# Patient Record
Sex: Female | Born: 1945
Health system: Southern US, Community
[De-identification: ages and names within clinical notes are randomized; demographics above are authoritative.]

## PROBLEM LIST (undated history)

## (undated) DIAGNOSIS — F419 Anxiety disorder, unspecified: Secondary | ICD-10-CM

## (undated) DIAGNOSIS — M5432 Sciatica, left side: Secondary | ICD-10-CM

## (undated) DIAGNOSIS — M4800 Spinal stenosis, site unspecified: Secondary | ICD-10-CM

## (undated) DIAGNOSIS — M7740 Metatarsalgia, unspecified foot: Secondary | ICD-10-CM

## (undated) DIAGNOSIS — M1990 Unspecified osteoarthritis, unspecified site: Secondary | ICD-10-CM

## (undated) DIAGNOSIS — N816 Rectocele: Secondary | ICD-10-CM

## (undated) DIAGNOSIS — J45909 Unspecified asthma, uncomplicated: Secondary | ICD-10-CM

## (undated) DIAGNOSIS — C4492 Squamous cell carcinoma of skin, unspecified: Secondary | ICD-10-CM

## (undated) DIAGNOSIS — R269 Unspecified abnormalities of gait and mobility: Secondary | ICD-10-CM

## (undated) DIAGNOSIS — K219 Gastro-esophageal reflux disease without esophagitis: Secondary | ICD-10-CM

## (undated) DIAGNOSIS — H269 Unspecified cataract: Secondary | ICD-10-CM

## (undated) DIAGNOSIS — H3320 Serous retinal detachment, unspecified eye: Secondary | ICD-10-CM

## (undated) DIAGNOSIS — G2581 Restless legs syndrome: Secondary | ICD-10-CM

## (undated) DIAGNOSIS — IMO0002 Reserved for concepts with insufficient information to code with codable children: Secondary | ICD-10-CM

## (undated) DIAGNOSIS — N952 Postmenopausal atrophic vaginitis: Secondary | ICD-10-CM

## (undated) DIAGNOSIS — M25559 Pain in unspecified hip: Secondary | ICD-10-CM

## (undated) DIAGNOSIS — E079 Disorder of thyroid, unspecified: Secondary | ICD-10-CM

## (undated) DIAGNOSIS — M216X9 Other acquired deformities of unspecified foot: Secondary | ICD-10-CM

## (undated) DIAGNOSIS — T7840XA Allergy, unspecified, initial encounter: Secondary | ICD-10-CM

## (undated) HISTORY — DX: Spinal stenosis, site unspecified: M48.00

## (undated) HISTORY — DX: Unspecified osteoarthritis, unspecified site: M19.90

## (undated) HISTORY — DX: Anxiety disorder, unspecified: F41.9

## (undated) HISTORY — DX: Postmenopausal atrophic vaginitis: N95.2

## (undated) HISTORY — DX: Other acquired deformities of unspecified foot: M21.6X9

## (undated) HISTORY — DX: Unspecified abnormalities of gait and mobility: R26.9

## (undated) HISTORY — PX: APPENDECTOMY: SHX54

## (undated) HISTORY — DX: Squamous cell carcinoma of skin, unspecified: C44.92

## (undated) HISTORY — DX: Serous retinal detachment, unspecified eye: H33.20

## (undated) HISTORY — PX: MYOMECTOMY: SHX85

## (undated) HISTORY — DX: Metatarsalgia, unspecified foot: M77.40

## (undated) HISTORY — PX: SPINE SURGERY: SHX786

## (undated) HISTORY — DX: Restless legs syndrome: G25.81

## (undated) HISTORY — DX: Rectocele: N81.6

## (undated) HISTORY — DX: Allergy, unspecified, initial encounter: T78.40XA

## (undated) HISTORY — DX: Disorder of thyroid, unspecified: E07.9

## (undated) HISTORY — DX: Unspecified asthma, uncomplicated: J45.909

## (undated) HISTORY — DX: Pain in unspecified hip: M25.559

## (undated) HISTORY — DX: Reserved for concepts with insufficient information to code with codable children: IMO0002

## (undated) HISTORY — PX: TONSILLECTOMY: SUR1361

## (undated) HISTORY — DX: Gastro-esophageal reflux disease without esophagitis: K21.9

## (undated) HISTORY — DX: Unspecified cataract: H26.9

## (undated) HISTORY — PX: EYE SURGERY: SHX253

## (undated) HISTORY — DX: Sciatica, left side: M54.32

## (undated) LAB — HM MAMMOGRAPHY

---

## 1997-10-01 ENCOUNTER — Other Ambulatory Visit: Admission: RE | Admit: 1997-10-01 | Discharge: 1997-10-01 | Payer: Self-pay | Admitting: Obstetrics and Gynecology

## 1997-11-04 ENCOUNTER — Other Ambulatory Visit: Admission: RE | Admit: 1997-11-04 | Discharge: 1997-11-04 | Payer: Self-pay | Admitting: Obstetrics and Gynecology

## 1998-01-22 ENCOUNTER — Other Ambulatory Visit: Admission: RE | Admit: 1998-01-22 | Discharge: 1998-01-22 | Payer: Self-pay | Admitting: Obstetrics and Gynecology

## 1998-04-26 ENCOUNTER — Other Ambulatory Visit: Admission: RE | Admit: 1998-04-26 | Discharge: 1998-04-26 | Payer: Self-pay | Admitting: Obstetrics and Gynecology

## 1998-06-23 ENCOUNTER — Ambulatory Visit (HOSPITAL_COMMUNITY): Admission: RE | Admit: 1998-06-23 | Discharge: 1998-06-23 | Payer: Self-pay | Admitting: Obstetrics and Gynecology

## 1998-09-21 ENCOUNTER — Other Ambulatory Visit: Admission: RE | Admit: 1998-09-21 | Discharge: 1998-09-21 | Payer: Self-pay | Admitting: Obstetrics and Gynecology

## 1999-09-22 ENCOUNTER — Other Ambulatory Visit: Admission: RE | Admit: 1999-09-22 | Discharge: 1999-09-22 | Payer: Self-pay | Admitting: Obstetrics and Gynecology

## 2000-10-01 ENCOUNTER — Other Ambulatory Visit: Admission: RE | Admit: 2000-10-01 | Discharge: 2000-10-01 | Payer: Self-pay | Admitting: Obstetrics and Gynecology

## 2000-11-07 ENCOUNTER — Other Ambulatory Visit: Admission: RE | Admit: 2000-11-07 | Discharge: 2000-11-07 | Payer: Self-pay | Admitting: Obstetrics and Gynecology

## 2000-11-07 ENCOUNTER — Encounter (INDEPENDENT_AMBULATORY_CARE_PROVIDER_SITE_OTHER): Payer: Self-pay

## 2000-12-12 ENCOUNTER — Other Ambulatory Visit: Admission: RE | Admit: 2000-12-12 | Discharge: 2000-12-12 | Payer: Self-pay | Admitting: Obstetrics and Gynecology

## 2000-12-12 ENCOUNTER — Encounter (INDEPENDENT_AMBULATORY_CARE_PROVIDER_SITE_OTHER): Payer: Self-pay

## 2001-03-28 ENCOUNTER — Other Ambulatory Visit: Admission: RE | Admit: 2001-03-28 | Discharge: 2001-03-28 | Payer: Self-pay | Admitting: Obstetrics and Gynecology

## 2001-10-01 ENCOUNTER — Other Ambulatory Visit: Admission: RE | Admit: 2001-10-01 | Discharge: 2001-10-01 | Payer: Self-pay | Admitting: Obstetrics and Gynecology

## 2002-11-10 ENCOUNTER — Other Ambulatory Visit: Admission: RE | Admit: 2002-11-10 | Discharge: 2002-11-10 | Payer: Self-pay | Admitting: Obstetrics and Gynecology

## 2003-11-12 ENCOUNTER — Other Ambulatory Visit: Admission: RE | Admit: 2003-11-12 | Discharge: 2003-11-12 | Payer: Self-pay | Admitting: Obstetrics and Gynecology

## 2004-03-09 ENCOUNTER — Encounter (INDEPENDENT_AMBULATORY_CARE_PROVIDER_SITE_OTHER): Payer: Self-pay | Admitting: Specialist

## 2004-03-09 ENCOUNTER — Ambulatory Visit (HOSPITAL_COMMUNITY): Admission: RE | Admit: 2004-03-09 | Discharge: 2004-03-09 | Payer: Self-pay | Admitting: Gastroenterology

## 2004-11-18 ENCOUNTER — Other Ambulatory Visit: Admission: RE | Admit: 2004-11-18 | Discharge: 2004-11-18 | Payer: Self-pay | Admitting: Addiction Medicine

## 2005-03-15 ENCOUNTER — Ambulatory Visit: Payer: Self-pay | Admitting: Internal Medicine

## 2006-01-12 ENCOUNTER — Ambulatory Visit: Payer: Self-pay

## 2006-01-23 ENCOUNTER — Ambulatory Visit (HOSPITAL_COMMUNITY): Admission: RE | Admit: 2006-01-23 | Discharge: 2006-01-24 | Payer: Self-pay | Admitting: Ophthalmology

## 2009-03-11 ENCOUNTER — Ambulatory Visit: Payer: Self-pay | Admitting: Sports Medicine

## 2009-03-11 DIAGNOSIS — R269 Unspecified abnormalities of gait and mobility: Secondary | ICD-10-CM

## 2009-03-11 DIAGNOSIS — M216X9 Other acquired deformities of unspecified foot: Secondary | ICD-10-CM

## 2009-03-11 DIAGNOSIS — M7740 Metatarsalgia, unspecified foot: Secondary | ICD-10-CM

## 2009-03-11 DIAGNOSIS — M7750 Other enthesopathy of unspecified foot: Secondary | ICD-10-CM | POA: Insufficient documentation

## 2009-03-11 DIAGNOSIS — Q667 Congenital pes cavus, unspecified foot: Secondary | ICD-10-CM | POA: Insufficient documentation

## 2009-03-11 HISTORY — DX: Other acquired deformities of unspecified foot: M21.6X9

## 2009-03-11 HISTORY — DX: Metatarsalgia, unspecified foot: M77.40

## 2009-03-11 HISTORY — DX: Unspecified abnormalities of gait and mobility: R26.9

## 2009-03-24 ENCOUNTER — Encounter: Payer: Self-pay | Admitting: Sports Medicine

## 2009-04-26 HISTORY — PX: ESOPHAGOGASTRODUODENOSCOPY: SHX1529

## 2009-04-26 HISTORY — PX: COLONOSCOPY: SHX174

## 2009-05-13 ENCOUNTER — Ambulatory Visit (HOSPITAL_COMMUNITY): Admission: RE | Admit: 2009-05-13 | Discharge: 2009-05-13 | Payer: Self-pay | Admitting: Gastroenterology

## 2009-07-16 ENCOUNTER — Encounter: Admission: RE | Admit: 2009-07-16 | Discharge: 2009-07-16 | Payer: Self-pay | Admitting: Emergency Medicine

## 2010-01-17 ENCOUNTER — Ambulatory Visit: Payer: Self-pay | Admitting: Sports Medicine

## 2010-01-17 DIAGNOSIS — M5432 Sciatica, left side: Secondary | ICD-10-CM

## 2010-01-17 HISTORY — DX: Sciatica, left side: M54.32

## 2010-01-18 ENCOUNTER — Telehealth: Payer: Self-pay | Admitting: Sports Medicine

## 2010-02-21 ENCOUNTER — Ambulatory Visit: Payer: Self-pay | Admitting: Sports Medicine

## 2010-07-26 NOTE — Assessment & Plan Note (Signed)
Summary: HIP PAIN/MJD   Vital Signs:  Patient profile:   65 year old female Height:      65 inches BP sitting:   100 / 70  (left arm) Cuff size:   regular  Vitals Entered By: Tessie Fass CMA (January 17, 2010 11:06 AM) CC: left hip pain   CC:  left hip pain.  History of Present Illness: 24F with sciatica.  10 yr hx of dealing with this pain, went to orthopaedist, has collapse of L5/S1 disc, did some PT, no improvement. Went to [employer], got some exercises, improved a little. Went to [facility] and had acupuncture, minimal improvement. Piriformis stretches at bedtime improve her pain transiently. 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 Pain mostly at L piriformis, radiates down back of thigh. No stool/urinary incontinence. Taking motrin 800 three times a day an tyleno qHS which only helps a little. Pain worse with hip ER exercises.  Wants new orthotics for another pair of shoes.  Current Medications (verified): 1)  Atrovent 0.03 % Soln (Ipratropium Bromide) .... 2 Sprays/nostril Three Times A Day 2)  Claritin 10 Mg Tabs (Loratadine) .Marland Kitchen.. 1 Tab By Mouth Daily 3)  Lunesta 2 Mg Tabs (Eszopiclone) .... As Needed Insomnia 4)  Alprazolam 0.5 Mg Tabs (Alprazolam) .... As Needed Insomnia/anxiety 5)  Sertraline Hcl 25 Mg Tabs (Sertraline Hcl) .Marland Kitchen.. 1 Tab By Mouth Daily 6)  Fosamax 70 Mg Tabs (Alendronate Sodium) .Marland Kitchen.. 1 Tab Weekly 7)  Aspir-Low 81 Mg Tbec (Aspirin) .Marland Kitchen.. 1 Tab By Mouth Daily 8)  Calcium 500/d 500-400 Mg-Unit Chew (Calcium-Vitamin D) .... 2 Tabs Daily 9)  Fish Oil 1000 Mg Caps (Omega-3 Fatty Acids) .... Two Times A Day 10)  Amitriptyline Hcl 50 Mg Tabs (Amitriptyline Hcl) .... One Half Tab By Mouth Qhs X 1 Week, Then One Tab By Mouth Qhs.  May Increase To Two Tabs By Mouth Qhs After A Week If No Improvement.  Allergies (verified): 1)  ! Sulfa  Review of Systems       SEe HPI  Physical Exam  General:  Well-developed,well-nourished,in no acute  distress; alert,appropriate and cooperative throughout examination Msk:  Hip:L ROM IR: 50 Deg, ER: 50 Deg, Flexion: 120 Deg, Extension: 100 Deg, Abduction: 45 Deg, Adduction: 45 Deg Strength IR: 5/5, ER: 5/5, Flexion: 5/5, Extension: 5/5, Abduction: 4/5, Adduction: 5/5 Pelvic alignment unremarkable to inspection and palpation. Standing hip rotation and gait without trendelenburg / unsteadiness. Greater trochanter without tenderness to palpation. Significant tenderness over piriformis. No SI joint tenderness and normal minimal SI movement.   Hip:R ROM IR: 50 Deg, ER: 50 Deg, Flexion: 120 Deg, Extension: 100 Deg, Abduction: 45 Deg, Adduction: 45 Deg Strength IR: 5/5, ER: 5/5, Flexion: 5/5, Extension: 5/5, Abduction: 5/5, Adduction: 5/5 Pelvic alignment unremarkable to inspection and palpation. Standing hip rotation and gait without trendelenburg / unsteadiness. Greater trochanter without tenderness to palpation. No tenderness over piriformis. No SI joint tenderness and normal minimal SI movement.  Additional Exam:  Gait examined and unremarkable.   Impression & Recommendations:  Problem # 1:  SCIATICA, LEFT (ICD-724.3) Assessment New Classic chronic sciatica at the piriformis exit. Recommend strengthen L hip abductors/rehab exercises. Amitryptiline No steroid for now. RTC 6 wks to fu.  Her updated medication list for this problem includes:    Aspir-low 81 Mg Tbec (Aspirin) .Marland Kitchen... 1 tab by mouth daily  Problem # 2:  CAVUS DEFORMITY OF FOOT, ACQUIRED (ICD-736.73) Assessment: Comment Only Will make fu appt for new orthotics.  Complete  Medication List: 1)  Atrovent 0.03 % Soln (Ipratropium bromide) .... 2 sprays/nostril three times a day 2)  Claritin 10 Mg Tabs (Loratadine) .Marland Kitchen.. 1 tab by mouth daily 3)  Lunesta 2 Mg Tabs (Eszopiclone) .... As needed insomnia 4)  Alprazolam 0.5 Mg Tabs (Alprazolam) .... As needed insomnia/anxiety 5)  Sertraline Hcl 25 Mg Tabs (Sertraline hcl)  .Marland Kitchen.. 1 tab by mouth daily 6)  Fosamax 70 Mg Tabs (Alendronate sodium) .Marland Kitchen.. 1 tab weekly 7)  Aspir-low 81 Mg Tbec (Aspirin) .Marland Kitchen.. 1 tab by mouth daily 8)  Calcium 500/d 500-400 Mg-unit Chew (Calcium-vitamin d) .... 2 tabs daily 9)  Fish Oil 1000 Mg Caps (Omega-3 fatty acids) .... Two times a day 10)  Amitriptyline Hcl 50 Mg Tabs (Amitriptyline hcl) .... One half tab by mouth qhs x 1 week, then one tab by mouth qhs.  may increase to two tabs by mouth qhs after a week if no improvement.  Patient Instructions: 1)  Great to meet you. 2)  Hip exercises. 3)  New medicine amytryptiline. 4)  Come back to see [place] in 6 weeks to make sure things are getting better.   Prescriptions: AMITRIPTYLINE HCL 50 MG TABS (AMITRIPTYLINE HCL) One half tab by mouth qHS x 1 week, then one tab by mouth qHS.  May increase to two tabs by mouth qHS after a week if no improvement.  #60 x 0   Entered by:   Rodney Langton MD   Authorized by:   Enid Baas MD   Signed by:   Rodney Langton MD on 01/17/2010   Method used:   Electronically to        [facility]  [address] [phone]* (retail)       [address]       [place], [place]  [address]       Ph: [phone] or [phone]       Fax: [phone]   RxID:   [id number]

## 2010-07-26 NOTE — Progress Notes (Signed)
  Phone Note Outgoing Call   Call placed by: Rodney Langton MD,  January 18, 2010 10:37 AM Summary of Call: Called pt re: max of 50mg  on amitryptiline.  She took the 1/2 tab, states she slept all night, no pain.  Will cont to increase as directed to a max of 50.  Also with some pain with abductor strengthening exercises.  She can get to 10 reps before pain.  Advised she do 10 and then stop before gets to pain, cont 3 sets, increase reps as tolerated. Initial call taken by: Rodney Langton MD,  January 18, 2010 10:45 AM    New/Updated Medications: AMITRIPTYLINE HCL 50 MG TABS (AMITRIPTYLINE HCL) One half tab by mouth qHS x 1 week, then one tab by mouth qHS.

## 2010-07-26 NOTE — Assessment & Plan Note (Signed)
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  Sciatica about 80% better did have rinary sxs on amitriptyline recent UTI now off the amitriptyline x 10 days keeping up exercises we RX and she feels that she can control sxs easing back into yoga  Allergies: 1)  ! Sulfa  Physical Exam  General:  Well-developed,well-nourished,in no acute distress; alert,appropriate and cooperative throughout examination Msk:  good ROM w hip able to do SLR and lat leg raise w no sciatic sxs good rotatory motin of hip and SIJ  feet are cavus shape has significant forefoot callus but less than beofre RT end to has pain at base and with flex and ext   Impression & Recommendations:  Problem # 1:  ABNORMALITY OF GAIT (ICD-781.2)  she was supinated and w forefoot was favoring this 2/2 metatarsalgai  Patient was fitted for a standard, cushioned, semi-rigid orthotic.  The orthotic was heated and the patient stood on the orthotic blank positioned on the orthotic stand. The patient was positioned in subtalar neutral position and 10 degrees of ankle dorsiflexion in a weight bearing stance. After completion of molding a stable based was applied to the orthotic blank.   The blank was ground to a stable position for weight bearing. size 10 red cambray foot tek base blue med EVA posting  RT forefoot cushion additional orthotic padding  RT MT pad  tiem 35 mins  gait afterwards was comfortable now in neutral position no limp  Orders: [employer], each unit (L3002)  Problem  # 2:  METATARSALGIA (ICD-726.70)  keep using MT pad on RT  Orders: [employer], each unit (L3002)  Problem # 3:  CAVUS DEFORMITY OF FOOT, ACQUIRED (ICD-736.73)  This is likely cause of stress over long term use shoes w good arch when not in orthotics  Orders: [employer], each unit (L3002)  Problem # 4:  SCIATICA, LEFT (ICD-724.3) improved will RX primarily w exercises and stop amitriptyline for now  reck as needed  copy to DR richter at [facility]  Complete Medication List: 1)  Atrovent 0.03 % Soln (Ipratropium bromide) .... 2 sprays/nostril three times a day 2)  Claritin 10 Mg Tabs (Loratadine) .Marland Kitchen.. 1 tab by mouth daily 3)  Lunesta 2 Mg Tabs (Eszopiclone) .... As needed insomnia 4)  Alprazolam 0.5 Mg Tabs (Alprazolam) .... As needed insomnia/anxiety 5)  Fosamax 70 Mg Tabs (Alendronate sodium) .Marland Kitchen.. 1 tab weekly 6)  Calcium 500/d 500-400 Mg-unit Chew (Calcium-vitamin d) .... 2 tabs daily 7)  Fish Oil 1000 Mg Caps (Omega-3 fatty acids) .... Two times a day 8)  Amitriptyline Hcl 50 Mg Tabs (Amitriptyline hcl) .... One half tab by mouth qhs x 1 week, then one tab by mouth qhs.

## 2010-11-11 NOTE — Op Note (Signed)
Kristin Tapia, Kristin Tapia               ACCOUNT NO.:  [id number]   MEDICAL RECORD NO.:  [id number]          PATIENT TYPE:  OIB   LOCATION:  5705                         FACILITY:  [facility]   PHYSICIAN:  Alford Highland. Rankin, M.D.   DATE OF BIRTH:  January 23, 1946   DATE OF PROCEDURE:  01/23/2006  DATE OF DISCHARGE:  01/24/2006                                 OPERATIVE REPORT   PREOPERATIVE DIAGNOSIS:  Rhegmatogenous detachment right eye, macula on.   POSTOPERATIVE DIAGNOSIS:  Rhegmatogenous detachment right eye, macula on.   PROCEDURE:  1. Scleral buckle using 287 solid silicone explant segmental superiorly      with retinal cryopexy OD.  2. External drainage subretinal fluid OD.   SURGEON:  Alford Highland. Rankin, M.D.   ANESTHESIA:  General endotracheal anesthesia.   INDICATIONS:  The patient is a 65 year old woman who has visual field loss  inferiorly on the basis of rhegmatogenous detachment with low lying  posterior vitreous attachment and not enough subretinal fluid to safely  repair the retinal detachment in the office with retinal cryopexy and  pneumatic retinopexy.  The patient was explained the need for undergoing  scleral buckling procedure.  She understands the risks of anesthesia,  __________ including but not limited to hemorrhage, infection, scarring,  need for another surgery, no change in vision, loss of vision, or  ________need for another surger__.  Appropriate signed consent was obtained.   PROCEDURE IN DETAIL:  The patient was taken to the operating room.  Appropriate monitors were placed followed by mild sedation.  At this time  general endotracheal anesthesia was induced without difficulty.  The right  periocular region was sterilely prepped and draped in the usual opthalmic  fashion.  Lid speculum was applied.  Conjunctival peritomy was then  fashioned from the 7 o'clock position superiorly around to the 3 o'clock  position.  The medial, superior and lateral rectus muscles were  isolated on  2-0 silk ties.  Ophthalmoscopy was then used to direct the delivery of  retinal cryopexy.  Retinal holes were found at the 1 o'clock, 12 o'clock and  the 10:30 positions.  Retinal cryopexy was applied to these areas.   At this time buckle was secured from approximately the 10 o'clock position  around to the 2 o'clock position.  Excellent temporary scleral indentation  was obtained.  Mersilene 5-0 mattress sutures were used to secure the buckle  in these areas.  At this time external draining of subretinal fluid was then  carried out in the area of the taller subretinal fluid accumulation which  was at the 1 o'clock position.  Also, retinal fluid was removed in this  fashion.  Intraocular pressure was maintained.  The buckle was then  tightened to the appropriate tension with 5-0 Mersilene mattress sutures.  At this time the buckle was tied to the appropriate tension.   Conjunctiva and tenons were then brought forward and closed in layers with 7-  0 Vicryl suture.  Subconjunctival injection of antibiotics were then  applied.  Sterile patch and Fox shield were applied.  The patient was  awakened from  anesthesia without difficulty and taken to the recovery room  in good stable condition.      Alford Highland Rankin, M.D.  Electronically Signed     GAR/MEDQ  D:  01/23/2006  T:  01/24/2006  Job:  [id number]   cc:   Molly Maduro L. Dione Booze, M.D.

## 2010-11-11 NOTE — Op Note (Signed)
NAME:  SAN, RUA                         ACCOUNT NO.:  [id number]   MEDICAL RECORD NO.:  [id number]                   PATIENT TYPE:  AMB   LOCATION:  ENDO                                 FACILITY:  [facility]   PHYSICIAN:  Petra Kuba, M.D.                 DATE OF BIRTH:  1946-06-24   DATE OF PROCEDURE:  03/09/2004  DATE OF DISCHARGE:                                 OPERATIVE REPORT   PROCEDURE:  Colonoscopy.   INDICATIONS FOR PROCEDURE:  Change in bowel habits, due for colonic  screening, father with colon polyps.  Consent was signed after risks,  benefits, and options were thoroughly discussed in the office.   MEDICATIONS USED:  Fentanyl 80 mcg, Versed 10 mg.   PROCEDURE:  Rectal inspection was pertinent for external hemorrhoids.  Digital exam was negative.  The video pediatric adjustable colonoscope was  inserted and with lots of difficulty due to a tortuous long looping colon,  we were able to advance to the cecum.  This did require rolling her on her  back  and some abdominal pressure.  No obvious abnormality was seen on  insertion.  The cecum was identified by the appendiceal orifice and the  ileocecal valve.  The scope was inserted a short ways into the terminal  ileum which was normal.  Photodocumentation was obtained.  The scope was  slowly withdrawn.  The prep was fairly adequate, with lots of washing and  suctioning, adequate visualization was obtained.  On slow withdrawal through  the colon, when we fell back around the tortuous loop, we did try to  readvance around the curves to decrease the chance of missing things.  She  did have a tiny ascending colon which was cold biopsied.  Otherwise, on slow  withdrawal, no abnormalities were seen.  She also had a tortuous sigmoid, as  well.  Once back in the rectum, anorectal pull through and retroflexion  confirmed some small hemorrhoids.  The scope was straightened and advanced a  short ways up the left side of the colon,  air was suctioned, the scope was  removed.  The patient tolerated the procedure well.  There was no obvious  immediate complications.   ENDOSCOPIC DIAGNOSIS:  1.  Internal and external small hemorrhoids.  2.  Tortuous long looping colon.  3.  Ascending tiny polyp cold biopsied.  4.  Otherwise, within normal limits to the terminal ileum.   PLAN:  Follow up with me p.r.n. or in 2-3 months to recheck bowel habits,  make sure no other workup plans are needed, consider a virtual colonoscopy  when next screening is needed based on tortuosity and the looping, we do not  have to decide that for probably five years.  Will be happy to see back  sooner p.r.n.  Petra Kuba, M.D.    MEM/MEDQ  D:  03/09/2004  T:  03/09/2004  Job:  [id number]

## 2011-10-01 ENCOUNTER — Other Ambulatory Visit: Payer: Self-pay | Admitting: Physician Assistant

## 2011-10-30 ENCOUNTER — Other Ambulatory Visit: Payer: Self-pay

## 2011-10-30 MED ORDER — ALENDRONATE SODIUM 70 MG PO TABS: 70.0000 mg | ORAL_TABLET | ORAL | Status: DC

## 2012-03-19 ENCOUNTER — Telehealth: Payer: Self-pay

## 2012-03-19 NOTE — Telephone Encounter (Signed)
Per Dr. Katrinka Blazing, on the bone density scan results, call pt and let her know that the scan is unchanged since 2011 and there is no worsening osteopenia. She said to continue current plan of treatment. LMOM to CB for these results.

## 2012-03-20 NOTE — Telephone Encounter (Signed)
Spoke with pt about this. Pt understood

## 2012-03-26 ENCOUNTER — Encounter: Payer: Self-pay | Admitting: Family Medicine

## 2012-03-26 ENCOUNTER — Ambulatory Visit (INDEPENDENT_AMBULATORY_CARE_PROVIDER_SITE_OTHER): Payer: Medicare Other | Admitting: Family Medicine

## 2012-03-26 VITALS — BP 102/64 | HR 65 | Temp 98.0°F | Resp 16 | Ht 64.25 in | Wt 133.2 lb

## 2012-03-26 DIAGNOSIS — Z23 Encounter for immunization: Secondary | ICD-10-CM

## 2012-03-26 DIAGNOSIS — Z01419 Encounter for gynecological examination (general) (routine) without abnormal findings: Secondary | ICD-10-CM

## 2012-03-26 DIAGNOSIS — Z13220 Encounter for screening for lipoid disorders: Secondary | ICD-10-CM

## 2012-03-26 DIAGNOSIS — Z0000 Encounter for general adult medical examination without abnormal findings: Secondary | ICD-10-CM

## 2012-03-26 DIAGNOSIS — Z131 Encounter for screening for diabetes mellitus: Secondary | ICD-10-CM

## 2012-03-26 LAB — GLUCOSE, POCT (MANUAL RESULT ENTRY): POC Glucose: 88 mg/dl (ref 70–99)

## 2012-03-26 NOTE — Progress Notes (Signed)
[address]   [place], [place]  [address]   [phone]  Subjective:    Patient ID: Kristin Tapia, female    DOB: September 07, 1945, 66 y.o.   MRN: [id number]  HPIThis 66 y.o. female presents for evaluation for Initial Annual Wellness Examination.  Last physical 03/13/11.  Pap smear 2011.  Mammogram 04/10/11 Kristin Tapia.  Pneumovax 02/2011.  Tetanus 06/2008.  Zostavax 2008 at Kristin Tapia.  Influenza vaccine 2012. Colonoscopy 04/2009.  Bone density scan 02/2012.  Eye exam 05/2011 Kristin Tapia; +glasses/ no glaucoma; +s/p cataracts B. Dental exam every six months.     PMH: detached retina 2008 R (Kristin Tapia), allergic rhinitis (shots) Kristin Tapia.  Asthma Childhood, GERD, RLS L leg (Kristin Tapia), Anxiety Sciatica L leg, Syncope Psurg:  1.  Appendectomy  2.  Gynecological myomectomy.  Kristin Tapia.  3.  Tonsillectomy  4. EGD  5.  Colonoscopy Specialist:  Kristin Tapia/Kristin Tapia (1/year), Kristin Tapia for orthotics, joint issues.  Kristin Tapia/Dermatology, Kristin Tapia or Kristin Tapia/Ophthalmology, Kristin Tapia/GI, Kristin Tapia/Neurology for RLS, insomnia, [place]/physiotherapist. 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  Water aerobics three days per week.  No tobacco/alcohol/drugs.  Joined Kristin Tapia.   Living Will:  FULL CODE but no prolonged measures.  Kristin Tapia has a copy of living will.   Family: Kristin Tapia- died age 65 due to unknown cause; Alzheimer's disease, HTN, OA, Breast Cancer age 17.  Father-died age 1, COPD, head injury while riding bike; hit by car, [place]?.  Siblings 3:  Kristin Tapia.    Review of Systems  Constitutional: Negative for fever, chills, diaphoresis, activity change, appetite change, fatigue and unexpected weight change.  HENT: Negative for hearing loss, ear pain, nosebleeds, congestion, sore throat, facial swelling, rhinorrhea, sneezing, drooling,  mouth sores, trouble swallowing, neck pain, neck stiffness, dental problem, voice change, postnasal drip, sinus pressure, tinnitus and ear discharge.   Eyes: Negative for photophobia, pain, discharge, redness, itching and visual disturbance.  Respiratory: Negative for apnea, cough, choking, chest tightness, shortness of breath, wheezing and stridor.   Cardiovascular: Negative for chest pain, palpitations and leg swelling.  Gastrointestinal: Negative for nausea, vomiting, abdominal pain, diarrhea, constipation, blood in stool, abdominal distention, anal bleeding and rectal pain.  Genitourinary: Negative for dysuria, urgency, frequency, hematuria, flank pain, decreased urine volume, vaginal bleeding, vaginal discharge, enuresis, difficulty urinating, genital sores, vaginal pain, menstrual problem, pelvic pain and dyspareunia.  Musculoskeletal: Negative for myalgias, back pain, joint swelling, arthralgias and gait problem.  Skin: Negative for color change, pallor, rash and wound.  Neurological: Negative for dizziness, tremors, seizures, syncope, facial asymmetry, speech difficulty, weakness, light-headedness, numbness and headaches.  Hematological: Negative for adenopathy. Does not bruise/bleed easily.  Psychiatric/Behavioral: Negative for suicidal ideas, hallucinations, behavioral problems, confusion, sleep disturbance, self-injury, dysphoric mood, decreased concentration and agitation. The patient is not nervous/anxious and is not hyperactive.        Objective:   Physical Exam  Nursing note and vitals reviewed. Constitutional: She is oriented to person, place, and time. She appears well-developed and well-nourished. No distress.  HENT:  Head: Normocephalic and atraumatic.  Right Ear: External ear normal.  Left Ear: External ear normal.  Nose: Nose normal.  Mouth/Throat: Oropharynx is clear and moist.  Eyes: Conjunctivae normal and EOM are normal. Pupils are equal, round, and reactive to light.   Neck: Normal range of motion. Neck supple. No JVD present. No thyromegaly present.  Cardiovascular: Normal rate, regular rhythm, normal heart sounds and intact distal pulses.  Exam reveals no gallop and no friction rub.   No murmur heard. Pulmonary/Chest: Effort normal and breath sounds normal. She has no wheezes. She has no rales.  Abdominal: Soft. Bowel sounds are normal. She exhibits no distension and no mass. There is no tenderness. There is no rebound and no guarding. Hernia confirmed negative in the right inguinal area and confirmed negative in the left inguinal area.  Genitourinary: Vagina normal and uterus normal. No breast swelling, tenderness, discharge or bleeding. There is no rash, tenderness or lesion on the right labia. There is no rash, tenderness or lesion on the left labia. Cervix exhibits no motion tenderness, no discharge and no friability. Right adnexum displays no mass, no tenderness and no fullness. Left adnexum displays no mass, no tenderness and no fullness. No vaginal discharge found.  Musculoskeletal:       Right shoulder: Normal.       Left shoulder: Normal.       Cervical back: Normal.  Lymphadenopathy:    She has no cervical adenopathy.       Right: No inguinal adenopathy present.       Left: No inguinal adenopathy present.  Neurological: She is alert and oriented to person, place, and time. She has normal reflexes. No cranial nerve deficit. She exhibits normal muscle tone. Coordination normal.  Skin: Skin is warm and dry. No rash noted. She is not diaphoretic. No erythema. No pallor.  Psychiatric: She has a normal mood and affect. Her behavior is normal. Judgment and thought content normal.   INFLUENZA VACCINE ADMINISTERED.    Assessment & Plan:   1. Screening for lipoid disorders  Lipid panel  2. Screening for diabetes mellitus  POCT glucose (manual entry)  3. Routine general medical examination at a health care facility  EKG 12-Lead, Flu vaccine greater than  or equal to 3yo preservative free IM

## 2012-03-27 ENCOUNTER — Ambulatory Visit (INDEPENDENT_AMBULATORY_CARE_PROVIDER_SITE_OTHER): Payer: Medicare Other | Admitting: Sports Medicine

## 2012-03-27 ENCOUNTER — Encounter: Payer: Self-pay | Admitting: Sports Medicine

## 2012-03-27 VITALS — BP 120/60 | Ht 64.0 in | Wt 132.0 lb

## 2012-03-27 DIAGNOSIS — M216X9 Other acquired deformities of unspecified foot: Secondary | ICD-10-CM

## 2012-03-27 DIAGNOSIS — R269 Unspecified abnormalities of gait and mobility: Secondary | ICD-10-CM

## 2012-03-27 LAB — LIPID PANEL
Cholesterol: 207 mg/dL — ABNORMAL HIGH (ref 0–200)
HDL: 71 mg/dL (ref >39)
LDL Cholesterol: 123 mg/dL — ABNORMAL HIGH (ref 0–99)
Triglycerides: 64 mg/dL (ref <150)

## 2012-03-27 NOTE — Assessment & Plan Note (Signed)
Patient orthotics made and did have adjustments made to her older orthotics. Patient is can wear these daily and understands that it may take 2 weeks for her to become acclimated to the new orthotics. Patient can bring them back for any other adjustments are necessary. Patient did have improvement in her gait after placement of orthotics in her shoes. Patient will followup as necessary.

## 2012-03-27 NOTE — Progress Notes (Signed)
History of Present Illness:  Here for new orthotics. This has significantly helped her foot pain. Patient though has noticed over the course of last couple months she's been having more left foot pain mostly on the lateral aspect. Patient notices that after a long day. Patient denies that her sciatica has come back at this time and overall is still doing much better than when she was without her orthotics. Denies any numbness in the feet denies any swelling. Patient denies any new injuries.  Review of systems: Patient is 14 system review done and remarkable as related to the orthopedic problems  Physical Exam  General: Well-developed,well-nourished,in no acute distress; alert,appropriate and cooperative throughout examination  Foot Exam: feet are cavus shape  has significant forefoot callus on plantar aspect of both feet over the third and fourth toes but less than before  Patient does have a collection of the transverse arch bilaterally and does have some mild supination and varus deformity of the forefoot of the left foot. Patient on gait analysis does have some supination mostly of the left ankle. Otherwise patient has a fairly neutral walking position.  Procedure: Patient was fitted for a standard, cushioned, semi-rigid orthotic. The orthotic was heated and the patient stood on the orthotic blank positioned on the orthotic stand. The patient was positioned in subtalar neutral position and 10 degrees of ankle dorsiflexion in a weight bearing stance.  After completion of molding a stable based was applied to the orthotic blank.  The blank was ground to a stable position for weight bearing.  size 10 red cambray foot tek  base blue med EVA  posting RT forefoot cushion and lateral post on the left foot additional orthotic padding bilateral MT pad size small time 35 mins  gait afterwards was comfortable  now in neutral position  no limp

## 2012-04-04 ENCOUNTER — Other Ambulatory Visit: Payer: Self-pay | Admitting: Family Medicine

## 2012-04-04 ENCOUNTER — Encounter: Payer: Self-pay | Admitting: Radiology

## 2012-04-04 ENCOUNTER — Other Ambulatory Visit: Payer: Self-pay | Admitting: Physician Assistant

## 2012-04-04 MED ORDER — ESTRADIOL 0.25 MG/0.25GM TD GEL: 1.0000 mL | TRANSDERMAL | Status: DC

## 2012-04-05 ENCOUNTER — Telehealth: Payer: Self-pay | Admitting: Physician Assistant

## 2012-04-05 NOTE — Telephone Encounter (Signed)
Yes, the [facility] did [facility] yesterday, they called to verify the quantity, it comes in packages and 4 packages is a month supply. They will give her the month supply. With one refill. Patient advised [facility].

## 2012-04-05 NOTE — Telephone Encounter (Signed)
The patient was getting this medication from [facility], and states that they haven't heard back from their refill request.  It looks like [facility].  Please call and verify that [facility] (the patient is happy to get it there, but was under the impression that it had to be compounded).

## 2012-04-06 ENCOUNTER — Telehealth: Payer: Self-pay

## 2012-04-06 NOTE — Telephone Encounter (Signed)
Pt called about prescription Estradiol. Pt states prescription given is different than prior med she received from [facility]. Please call pt to discuss. Best # [phone]

## 2012-04-06 NOTE — Telephone Encounter (Signed)
Spoke with patient and the Estradiol that was sent to pharmacy was not the right one. She was on Estradiol vaginal cream use 2 x week. The one that was sent in to pharmacy was Estradiol Gel 1 x wk. She said she will use this for now an call back in a couple weeks so we can send in correct RX but [facility] not [facility].

## 2012-04-10 ENCOUNTER — Telehealth: Payer: Self-pay | Admitting: Radiology

## 2012-04-10 NOTE — Telephone Encounter (Signed)
[facility] called back they can not find her information.

## 2012-04-10 NOTE — Telephone Encounter (Signed)
Left mssg at [facility] to see if they can compound the rx for her, they will call me back.

## 2012-04-11 MED ORDER — ESTROGENS, CONJUGATED 0.625 MG/GM VA CREA: TOPICAL_CREAM | Freq: Every day | VAGINAL | Status: DC

## 2012-04-11 NOTE — Telephone Encounter (Signed)
Medication called to [facility] for her.

## 2012-04-16 LAB — HM MAMMOGRAPHY: HM Mammogram: NORMAL

## 2012-04-18 ENCOUNTER — Encounter: Payer: Self-pay | Admitting: Radiology

## 2012-04-18 ENCOUNTER — Telehealth: Payer: Self-pay | Admitting: Radiology

## 2012-04-18 NOTE — Telephone Encounter (Signed)
Yes, please schedule her next mammogram in one year.  KMS

## 2012-04-18 NOTE — Telephone Encounter (Signed)
Called patient, to advise mammogram I abstracted this, however I am not sure when she needs her next mammogram, please advise. ? 1 year, if I do not put due date in computer, it will not show as due. Thanks Amy

## 2012-04-19 NOTE — Telephone Encounter (Signed)
Thanks, patient advised.  

## 2012-05-31 DIAGNOSIS — Z0000 Encounter for general adult medical examination without abnormal findings: Secondary | ICD-10-CM | POA: Insufficient documentation

## 2012-05-31 DIAGNOSIS — Z13220 Encounter for screening for lipoid disorders: Secondary | ICD-10-CM | POA: Insufficient documentation

## 2012-05-31 DIAGNOSIS — Z23 Encounter for immunization: Secondary | ICD-10-CM | POA: Insufficient documentation

## 2012-05-31 DIAGNOSIS — Z131 Encounter for screening for diabetes mellitus: Secondary | ICD-10-CM | POA: Insufficient documentation

## 2012-05-31 NOTE — Assessment & Plan Note (Addendum)
Anticipatory guidance --- exercise, weight maintenance.  Pap UTD. Mammogram UTD at this time; will refer for repeat.  Bone density UTD.  Immunizations UTD; s/p flu vaccine in office.  Obtain screening labs.  No evidence of depression.  Independent with ADLs.  No evidence of depression.  Low fall risk.  FULL CODE.

## 2012-05-31 NOTE — Assessment & Plan Note (Signed)
Per current Medicare guidelines, obtain lipid panel screening every five years.

## 2012-05-31 NOTE — Assessment & Plan Note (Signed)
Per current Medicare guidelines, obtain yearly glucose.

## 2012-06-01 NOTE — Progress Notes (Signed)
Reviewed and agree.

## 2012-09-09 ENCOUNTER — Other Ambulatory Visit (INDEPENDENT_AMBULATORY_CARE_PROVIDER_SITE_OTHER): Payer: 59 | Admitting: Radiology

## 2012-09-09 ENCOUNTER — Other Ambulatory Visit: Payer: Self-pay | Admitting: Emergency Medicine

## 2012-09-09 DIAGNOSIS — Z23 Encounter for immunization: Secondary | ICD-10-CM

## 2012-09-27 ENCOUNTER — Encounter: Payer: Self-pay | Admitting: Family Medicine

## 2012-10-14 ENCOUNTER — Ambulatory Visit (INDEPENDENT_AMBULATORY_CARE_PROVIDER_SITE_OTHER): Payer: 59 | Admitting: Physician Assistant

## 2012-10-14 DIAGNOSIS — Z23 Encounter for immunization: Secondary | ICD-10-CM

## 2012-10-14 MED ORDER — HEPATITIS B VAC RECOMBINANT 5 MCG/0.5ML IJ SUSP: 0.5000 mL | Freq: Once | INTRAMUSCULAR | Status: DC

## 2012-10-14 NOTE — Progress Notes (Signed)
   Patient ID: [name] MRN: [id number], DOB: [birth year], 67 y.o. Date of Encounter: 10/14/2012, 4:27 PM  Primary Physician: Nilda Simmer, MD  Chief Complaint: Here for hepatitis B vaccination  66 y.o. year old female here for hepatitis B vaccination. This is the 2nd vaccine in the series. Last injection was 09/09/12. Ok to give hepatitis B vaccination. This was a nursing only encounter. No provider/patient encounter occurred today.   SignedEula Listen, PA-C 10/14/2012 4:27 PM

## 2013-01-17 ENCOUNTER — Ambulatory Visit (INDEPENDENT_AMBULATORY_CARE_PROVIDER_SITE_OTHER): Payer: Medicare Other | Admitting: Family Medicine

## 2013-01-17 VITALS — BP 100/60 | Ht 64.0 in | Wt 132.0 lb

## 2013-01-17 DIAGNOSIS — M25552 Pain in left hip: Secondary | ICD-10-CM | POA: Insufficient documentation

## 2013-01-17 DIAGNOSIS — M7750 Other enthesopathy of unspecified foot: Secondary | ICD-10-CM

## 2013-01-17 DIAGNOSIS — M25559 Pain in unspecified hip: Secondary | ICD-10-CM

## 2013-01-17 DIAGNOSIS — M216X9 Other acquired deformities of unspecified foot: Secondary | ICD-10-CM

## 2013-01-17 DIAGNOSIS — M7742 Metatarsalgia, left foot: Secondary | ICD-10-CM

## 2013-01-17 DIAGNOSIS — M7741 Metatarsalgia, right foot: Secondary | ICD-10-CM

## 2013-01-17 HISTORY — DX: Pain in unspecified hip: M25.559

## 2013-01-17 NOTE — Progress Notes (Signed)
  Subjective:    Patient ID: Kristin Tapia, female    DOB: [birth year], 67 y.o.   MRN: [id number]  HPI Left hip pain, right foot pain and desire for new orthotics #1. Left hip pain. It has been chronic but worse over the last couple of months. She's also having some left shin pain. Both of these bother her more after walking or sitting for extended periods of time #2. Right foot pain. She had previously used a metatarsal pad but it suddenly became bothersome to her. She's having right forefoot pain with standing or walking. #3. Has used orthotics successfully for many years. Is planning to go a long trip to like to get her old one to redone.   Review of Systems No numbness in the feet. No weakness in the legs.    Objective:   Physical Exam Vital signs are reviewed GENERAL: Well-developed female no acute distress HIPS: Left. Mildly tender to palpation in the area around the left greater trochanteric bursa. She has some mild pain with rear extension. Hip musculature strength is intact. Internal and external rotation are painless and normal. FOOT: Right colon cavus foot. Loss of transverse arch. Tender to palpation across the metatarsal heads on the plantar surface.   Patient was fitted for a : standard, cushioned, semi-rigid orthotic. The orthotic was heated, placed on the orthotic stand. The patient was positioned in subtalar neutral position and 10 degrees of ankle dorsiflexion in a weight bearing stance on the heated orthotic blank After completion of molding, a stable base was applied to the orthotic blank. The blank was ground to a stable position for weight bearing. Blank: Size 10 red Base: Blue foam Posting: None  Face to face time spent in evaluation, measurement and manufacture of custom molded orthotic was 40 minutes.      Assessment & Plan:  #1. Left hip pain I think is probably related to gait issues secondary to her foot issues. Today we made her some new orthotics. I  gave her some metatarsal pads to use in her dress shoes. Also start her on posterior leg raises for her hip pain. Should it not improve, however recommend ultrasound of bursa there is it's quite tender and consider corticosteroid injection. #2. For her various foot issues Mason new orthotics today #3. For her left lower leg anterior shin pain, I think again it is related to her gait abnormality. I recommended some Theragran exercises for the anterior tibialis muscle

## 2013-01-22 ENCOUNTER — Encounter: Payer: Self-pay | Admitting: Family Medicine

## 2013-01-22 ENCOUNTER — Ambulatory Visit (INDEPENDENT_AMBULATORY_CARE_PROVIDER_SITE_OTHER): Payer: 59 | Admitting: Family Medicine

## 2013-01-22 VITALS — BP 128/74 | HR 67 | Temp 98.4°F | Resp 16 | Ht 65.0 in | Wt 137.4 lb

## 2013-01-22 DIAGNOSIS — G4700 Insomnia, unspecified: Secondary | ICD-10-CM

## 2013-01-22 DIAGNOSIS — R42 Dizziness and giddiness: Secondary | ICD-10-CM

## 2013-01-22 DIAGNOSIS — M25559 Pain in unspecified hip: Secondary | ICD-10-CM

## 2013-01-22 LAB — POCT URINALYSIS DIPSTICK
Glucose, UA: NEGATIVE
Ketones, UA: NEGATIVE
Nitrite, UA: NEGATIVE
Protein, UA: NEGATIVE
Urobilinogen, UA: 0.2

## 2013-01-22 LAB — CBC WITH DIFFERENTIAL/PLATELET
Basophils Absolute: 0 10*3/uL (ref 0.0–0.1)
Basophils Relative: 0 % (ref 0–1)
Eosinophils Absolute: 0.1 10*3/uL (ref 0.0–0.7)
Eosinophils Relative: 1 % (ref 0–5)
Hemoglobin: 13.2 g/dL (ref 12.0–15.0)
Lymphocytes Relative: 27 % (ref 12–46)
MCHC: 33.8 g/dL (ref 30.0–36.0)
MCV: 95.6 fL (ref 78.0–100.0)
Neutrophils Relative %: 65 % (ref 43–77)
RDW: 12.8 % (ref 11.5–15.5)
WBC: 4.8 10*3/uL (ref 4.0–10.5)

## 2013-01-22 LAB — COMPREHENSIVE METABOLIC PANEL
AST: 20 U/L (ref 0–37)
Albumin: 4 g/dL (ref 3.5–5.2)
BUN: 17 mg/dL (ref 6–23)
Chloride: 101 mEq/L (ref 96–112)
Creat: 1.03 mg/dL (ref 0.50–1.10)
Glucose, Bld: 81 mg/dL (ref 70–99)
Potassium: 4.3 mEq/L (ref 3.5–5.3)
Total Bilirubin: 0.3 mg/dL (ref 0.3–1.2)
Total Protein: 6.8 g/dL (ref 6.0–8.3)

## 2013-01-22 MED ORDER — ALPRAZOLAM 0.25 MG PO TABS: 0.2500 mg | ORAL_TABLET | Freq: Every evening | ORAL | Status: DC | PRN

## 2013-01-22 MED ORDER — MELOXICAM 15 MG PO TABS: 15.0000 mg | ORAL_TABLET | Freq: Every day | ORAL | Status: DC

## 2013-01-22 MED ORDER — ALPRAZOLAM 1 MG PO TABS: 0.5000 mg | ORAL_TABLET | Freq: Every evening | ORAL | Status: DC | PRN

## 2013-01-22 NOTE — Progress Notes (Signed)
[address]   [place], [place]  [address]   [phone]  Subjective:    Patient ID: Kristin Tapia, female    DOB: 12-08-1945, 67 y.o.   MRN: [id number]  HPI This 67 y.o. female presents for evaluation of the following:  1.  Artrhalgias:  Chronic issue for patient.  Avoids Ibuprofen unless has eaten; takes Ibuprofen with dinner 400mg  daily.  Has upcoming trip; nineteen hour flight will be horrible for joint stiffness and pain. Going on trip to [place] and [place].  Interested in other medication for pain other than Ibuprofen.  +Rigorous trip.  Exercises reguarly.  Ibuprofen really helps 400mg ; with severe pain four tablets at a time but this is infrequent.  B hip pain.  ?B hip bursitis.  L lateral aspect of leg: feet and ankle.  Exercise helps with various joint pains.  2.  Insomnia: has weaned self off of Lunesta due to advancing age; previously alternated Lunesta with Xanax 0.25mg ; no longer takes [place] but takes Xanax once per week on average.  Needs refill on Xanax before upcoming trip; previously prescribed 1mg  Xanax and takes 1/4 tablet; last rx 1.5 years ago.  3.  Near syncope/dizziness/orthrostatic hypotension:  Has worsened lately.  Almost occurring every time changes position especially when going from sitting position.  Has episodes where symptoms will worsen for a while and then improve.  Has been maintaining salt intake.  No excessive sweating or exercise.  Not sure why dizziness has improved.  Has suffered with one episode in past year where dizziness has been associated with nausea, diaphoresis; last episode 07/2012.       Review of Systems  Constitutional: Negative for fever, chills, diaphoresis, activity change, appetite change and fatigue.  Eyes: Negative for photophobia and visual disturbance.  Respiratory: Negative for shortness of breath, wheezing and stridor.   Cardiovascular: Negative for chest pain, palpitations and leg swelling.  Gastrointestinal: Negative for  nausea and vomiting.  Musculoskeletal: Positive for myalgias and arthralgias. Negative for back pain and joint swelling.  Neurological: Positive for dizziness and light-headedness. Negative for tremors, seizures, syncope, facial asymmetry, speech difficulty, weakness, numbness and headaches.  Psychiatric/Behavioral: Positive for sleep disturbance. Negative for dysphoric mood. The patient is not nervous/anxious.     Past Medical History  Diagnosis Date  . Allergy   . Arthritis   . GERD (gastroesophageal reflux disease)   . Cataract   . Asthma   . Thyroid disease   . Anxiety   . Osteoporosis   . Restless leg syndrome   . Rectocele   . Cystocele   . Detached retina   . Postmenopausal atrophic vaginitis     Past Surgical History  Procedure Laterality Date  . Appendectomy    . Colonoscopy  04/2009    Int & Ext Hemorrhoids, L sided diverticula.  [facility].  Repeat 5 years  . Esophagogastroduodenoscopy  04/2009    tiny HH, minimal antritis, gastric polyps. [facility].    Prior to Admission medications   Medication Sig Start Date End Date Taking? Authorizing Provider  Calcium Carbonate-Vitamin D (CALCIUM 600+D3) 600-400 MG-UNIT per tablet Take 1 tablet by mouth 3 (three) times a week.   Yes Historical Provider, MD  cetirizine (ZYRTEC) 10 MG tablet Take 10 mg by mouth at bedtime.   Yes Historical Provider, MD  chlorpheniramine (CHLOR-TRIMETON) 4 MG tablet Take 4 mg by mouth 2 (two) times daily as needed for allergies (when needed 3 to 4 times a week).   Yes Historical  Provider, MD  cholecalciferol (VITAMIN D) 400 UNITS TABS Take 400 Units by mouth 3 (three) times a week.   Yes Historical Provider, MD  conjugated estrogens (PREMARIN) vaginal cream Place vaginally daily. This is a compounded RX/ was called to [facility]. [phone]. 1 year supply 04/11/12  Yes Heather M Marte, PA-C  cycloSPORINE (RESTASIS) 0.05 % ophthalmic emulsion Place 1 drop into both eyes 2 (two) times daily.    Yes Historical Provider, MD  fexofenadine (ALLEGRA) 180 MG tablet Take 180 mg by mouth every morning.   Yes Historical Provider, MD  ipratropium (ATROVENT) 0.03 % nasal spray Place 2 sprays into the nose 3 (three) times daily.   Yes Historical Provider, MD  Magnesium 250 MG TABS Take 1 tablet by mouth daily.   Yes Historical Provider, MD  Omega-3 Fatty Acids (FISH OIL) 1200 MG CAPS Take 1,000 mg by mouth daily. Patient takes 2 gel caps daily   Yes Historical Provider, MD  Turmeric 500 MG CAPS Take 500 mg by mouth 2 (two) times daily.   Yes Historical Provider, MD  alendronate (FOSAMAX) 70 MG tablet TAKE 1 TABLET EVERY WEEK ON EMPTY STOMACH-DO NOT LIE DOWN OR EAT FOR 30 MINUTES AFTER DOSE 04/04/12   Eleanore Delia Chimes, PA-C  ALPRAZolam Prudy Feeler) 1 MG tablet Take 0.5 tablets (0.5 mg total) by mouth at bedtime as needed for sleep. 01/22/13   Ethelda Chick, MD  Estradiol 0.25 MG/0.25GM GEL Place 1 mL onto the skin once a week. 04/04/12   Heather Jaquita Rector, PA-C  meloxicam (MOBIC) 15 MG tablet Take 1 tablet (15 mg total) by mouth daily. 01/22/13   Ethelda Chick, MD  Multiple Vitamins-Minerals (SENIOR MULTIVITAMIN PLUS PO) Take 1 tablet by mouth daily.    Historical Provider, MD    Allergies  Allergen Reactions  . Sulfonamide Derivatives     History   Social History  . Marital Status: Married    Spouse Name: N/A    Number of Children: N/A  . Years of Education: N/A   Occupational History  . Not on file.   Social History Main Topics  . Smoking status: Never Smoker   . Smokeless tobacco: Not on file  . Alcohol Use: No  . Drug Use: No  . Sexually Active: Yes   Other Topics Concern  . Not on file   Social History Narrative  . No narrative on file    Family History  Problem Relation Age of Onset  . Arthritis Mother   . Hypertension Mother   . Dementia Mother   . COPD Father   . Seizures Brother        Objective:   Physical Exam  Nursing note and vitals reviewed. Constitutional:  She is oriented to person, place, and time. She appears well-developed and well-nourished. No distress.  HENT:  Head: Normocephalic and atraumatic.  Right Ear: External ear normal.  Left Ear: External ear normal.  Eyes: Conjunctivae and EOM are normal. Pupils are equal, round, and reactive to light.  Neck: Normal range of motion. Neck supple. No thyromegaly present.  Cardiovascular: Normal rate, regular rhythm, normal heart sounds and intact distal pulses.  Exam reveals no gallop and no friction rub.   No murmur heard. Pulmonary/Chest: Effort normal and breath sounds normal. She has no wheezes. She has no rales.  Abdominal: Soft. Bowel sounds are normal. There is no tenderness. There is no rebound and no guarding.  Lymphadenopathy:    She has no  cervical adenopathy.  Neurological: She is alert and oriented to person, place, and time. No cranial nerve deficit. She exhibits normal muscle tone. Coordination normal.  Skin: Skin is warm and dry. No rash noted. She is not diaphoretic.  Psychiatric: She has a normal mood and affect. Her behavior is normal. Judgment and thought content normal.       Assessment & Plan:  Chronic arthralgias of knees and hips, unspecified laterality  Insomnia  Dizziness - Plan: CBC with Differential, Comprehensive metabolic panel, POCT urinalysis dipstick   1.  Arthralgias:  Persistent and worsening with age; stop Ibuprofen; rx for Meloxicam provided to try for upcoming trip. If needs further pain control, recommend adding Tylenol to Meloxicam. 2.  Insomnia: stable; refill of Xanax 1mg  0.25mg  PRN.   3.  Dizziness Orthostatic: Worsening lately; s/p extensive cardiac work up in the past; obtain labs to rule out secondary causes.  RTC if continues to worsen.  Maintain salt intake.  Meds ordered this encounter  Medications  . chlorpheniramine (CHLOR-TRIMETON) 4 MG tablet    Sig: Take 4 mg by mouth 2 (two) times daily as needed for allergies (when needed 3 to 4  times a week).  . Turmeric 500 MG CAPS    Sig: Take 500 mg by mouth 2 (two) times daily.  Marland Kitchen DISCONTD: meloxicam (MOBIC) 15 MG tablet    Sig: Take 1 tablet (15 mg total) by mouth daily.    Dispense:  30 tablet    Refill:  5  . DISCONTD: ALPRAZolam (XANAX) 0.25 MG tablet    Sig: Take 1 tablet (0.25 mg total) by mouth at bedtime as needed for sleep.    Dispense:  30 tablet    Refill:  3  . DISCONTD: ALPRAZolam (XANAX) 0.25 MG tablet    Sig: Take 1 tablet (0.25 mg total) by mouth at bedtime as needed for sleep.    Dispense:  30 tablet    Refill:  3  . DISCONTD: meloxicam (MOBIC) 15 MG tablet    Sig: Take 1 tablet (15 mg total) by mouth daily.    Dispense:  30 tablet    Refill:  5  . DISCONTD: ALPRAZolam (XANAX) 1 MG tablet    Sig: Take 0.5 tablets (0.5 mg total) by mouth at bedtime as needed for sleep.    Dispense:  30 tablet    Refill:  3  . ALPRAZolam (XANAX) 1 MG tablet    Sig: Take 0.5 tablets (0.5 mg total) by mouth at bedtime as needed for sleep.    Dispense:  30 tablet    Refill:  3  . meloxicam (MOBIC) 15 MG tablet    Sig: Take 1 tablet (15 mg total) by mouth daily.    Dispense:  30 tablet    Refill:  5

## 2013-01-29 ENCOUNTER — Other Ambulatory Visit: Payer: Self-pay

## 2013-03-07 ENCOUNTER — Telehealth: Payer: Self-pay

## 2013-03-07 MED ORDER — DOXYCYCLINE HYCLATE 100 MG PO CAPS: 100.0000 mg | ORAL_CAPSULE | Freq: Every day | ORAL | Status: DC

## 2013-03-07 NOTE — Telephone Encounter (Signed)
[name] reported that Pt lost her anti-malaria meds on her trip and needs a replacement Rx for Doxy 100 mg 1 tab po daily #22. Please send to her pharmacy and notify [name] when done.

## 2013-03-07 NOTE — Telephone Encounter (Signed)
Rx sent to pharmacy   

## 2013-03-23 ENCOUNTER — Encounter: Payer: Self-pay | Admitting: Family Medicine

## 2013-03-24 MED ORDER — FLUCONAZOLE 150 MG PO TABS: 150.0000 mg | ORAL_TABLET | Freq: Once | ORAL | Status: DC

## 2013-03-24 NOTE — Telephone Encounter (Signed)
This is done.

## 2013-04-16 ENCOUNTER — Other Ambulatory Visit: Payer: Self-pay | Admitting: Family Medicine

## 2013-04-16 ENCOUNTER — Encounter: Payer: Self-pay | Admitting: Family Medicine

## 2013-04-16 ENCOUNTER — Ambulatory Visit (INDEPENDENT_AMBULATORY_CARE_PROVIDER_SITE_OTHER): Payer: Medicare Other | Admitting: Family Medicine

## 2013-04-16 VITALS — BP 112/70 | HR 71 | Temp 97.8°F | Resp 16 | Ht 64.0 in | Wt 135.8 lb

## 2013-04-16 DIAGNOSIS — Z139 Encounter for screening, unspecified: Secondary | ICD-10-CM

## 2013-04-16 DIAGNOSIS — Z0000 Encounter for general adult medical examination without abnormal findings: Secondary | ICD-10-CM

## 2013-04-16 DIAGNOSIS — Z01419 Encounter for gynecological examination (general) (routine) without abnormal findings: Secondary | ICD-10-CM

## 2013-04-16 DIAGNOSIS — R319 Hematuria, unspecified: Secondary | ICD-10-CM

## 2013-04-16 DIAGNOSIS — Z23 Encounter for immunization: Secondary | ICD-10-CM

## 2013-04-16 DIAGNOSIS — M899 Disorder of bone, unspecified: Secondary | ICD-10-CM

## 2013-04-16 DIAGNOSIS — Z131 Encounter for screening for diabetes mellitus: Secondary | ICD-10-CM

## 2013-04-16 LAB — CBC WITH DIFFERENTIAL/PLATELET
Basophils Absolute: 0 10*3/uL (ref 0.0–0.1)
Eosinophils Relative: 1 % (ref 0–5)
Lymphocytes Relative: 25 % (ref 12–46)
MCHC: 34.5 g/dL (ref 30.0–36.0)
MCV: 91.5 fL (ref 78.0–100.0)
Neutrophils Relative %: 65 % (ref 43–77)
Platelets: 228 10*3/uL (ref 150–400)
RBC: 4.34 MIL/uL (ref 3.87–5.11)
RDW: 13.3 % (ref 11.5–15.5)
WBC: 3.8 10*3/uL — ABNORMAL LOW (ref 4.0–10.5)

## 2013-04-16 LAB — COMPREHENSIVE METABOLIC PANEL
ALT: 30 U/L (ref 0–35)
Albumin: 4.1 g/dL (ref 3.5–5.2)
Alkaline Phosphatase: 44 U/L (ref 39–117)
BUN: 11 mg/dL (ref 6–23)
CO2: 31 mEq/L (ref 19–32)
Calcium: 9.4 mg/dL (ref 8.4–10.5)
Chloride: 101 mEq/L (ref 96–112)
Glucose, Bld: 92 mg/dL (ref 70–99)
Potassium: 4.1 mEq/L (ref 3.5–5.3)
Sodium: 137 mEq/L (ref 135–145)
Total Protein: 7.1 g/dL (ref 6.0–8.3)

## 2013-04-16 LAB — POCT URINALYSIS DIPSTICK
Glucose, UA: NEGATIVE
Ketones, UA: NEGATIVE
Spec Grav, UA: 1.01

## 2013-04-16 LAB — POCT UA - MICROSCOPIC ONLY
Casts, Ur, LPF, POC: NEGATIVE
Yeast, UA: NEGATIVE

## 2013-04-16 NOTE — Progress Notes (Signed)
[address]   [place], [place]  [address]   [phone]  Subjective:    Patient ID: Kristin Tapia, female    DOB: 1946/01/11, 67 y.o.   MRN: [id number]  HPI This 67 y.o. female presents for Annual Wellness Exam.  Last annual wellness 03/26/2012. Pap smear 2011.  Abnormal pap smear premenopausal.   Mammogram 04/10/11 [facility].  Pneumovax 02/2011.  Tetanus 06/2008.  Zostavax 2008 at [facility].  Influenza vaccine 2012. 2014. Colonoscopy 04/2009.  Repeat in ten years.   Bone density scan 02/2012.   Eye exam 7/2014[facility]; +glasses/ no glaucoma; +s/p cataracts B.  Dental exam every six months.   Insomnia: still taking Xanax 1/4 2-3 times per week.  No longer taking Lunesta.  Sleeping is better.  OA pain: ankle stiffness; B hip stiffness.  S/p ortho consultation.  S/p evaluation by Dr. Roanna Epley.  Exercise for ankles.   Possible bursitis of hip.  Plans to return back to [facility].  Recurrent ankle sprains. Does not limit activity.  Gas: suffers with a lot of gas.  Chronic issue.  LPR:  Follow-up with ENT; drinks hot liquids.  No medications due to side effects.  Dry skin:   Dry mouth, vaginal dryness; dentist suggested Sjogren's disease; s/p rheumatology consultation; borderline labs; repeat in six months; has been 2-3 years.    PMH: detached retina 2008 R ([place]), allergic rhinitis (shots) [place]min [place]. Asthma Childhood, GERD, RLS L leg ([place]), Anxiety Sciatica L leg, Syncope  Psurg: 1. Appendectomy 2. Gynecological myomectomy. [place]. 3. Tonsillectomy 4. EGD 5. Colonoscopy  Specialist: Sharma/Allergist (1/year), Fields/SM for orthotics, joint issues. Gruber/Dermatology, Alden Hipp or [facility]/Ophthalmology, Magod/GI, Dohmeier/Neurology for RLS, insomnia, [place]/physiotherapist.  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 Water aerobics three days per week. No tobacco/alcohol/drugs. Joined [employer].  Living Will: FULL CODE but no prolonged measures. [name] has a copy of living will.  Family: M- died age 31 due to unknown cause; Alzheimer's disease, HTN, OA, Breast Cancer age 70. Father-died age 20, COPD, head injury while riding bike; hit by car, MELANOMA?. Siblings 3: Epilepsy.   Review of Systems  Constitutional: Negative.   HENT: Negative.   Eyes: Negative.   Respiratory: Negative.   Cardiovascular: Negative.   Gastrointestinal: Negative.   Endocrine: Negative.   Genitourinary: Negative.   Musculoskeletal: Positive for arthralgias.  Skin: Negative.   Allergic/Immunologic: Negative.   Neurological: Negative.   Hematological: Negative.   Psychiatric/Behavioral: Negative.        Objective:   Physical Exam  Nursing note and vitals reviewed. Constitutional: She is oriented to person, place, and time. She appears well-developed and well-nourished. No distress.  HENT:  Head: Normocephalic and atraumatic.  Right Ear: External ear normal.  Left Ear: External ear normal.  Nose: Nose normal.  Mouth/Throat: Oropharynx is clear and moist.  Eyes: Conjunctivae and EOM are normal. Pupils are equal, round, and reactive to light.  Neck: Normal range of motion and full passive range of motion without pain. Neck supple. No JVD present. Carotid bruit is not present. No thyromegaly present.  Cardiovascular: Normal rate, regular rhythm and normal heart sounds.  Exam reveals no gallop and no friction rub.   No murmur heard. Pulmonary/Chest: Effort normal and breath sounds normal. She has no wheezes. She has no rales.  Abdominal: Soft.  Bowel sounds are normal. She exhibits no distension and no mass. There is no tenderness. There is no rebound and no guarding.  Genitourinary: Vagina normal and uterus normal. No breast swelling, tenderness, discharge or bleeding. Pelvic exam was  performed with patient supine. There is no rash, tenderness or lesion on the right labia. There is no rash, tenderness or lesion on the left labia. Cervix exhibits no motion tenderness, no discharge and no friability. Right adnexum displays no mass, no tenderness and no fullness. Left adnexum displays no mass, no tenderness and no fullness.  Musculoskeletal: Normal range of motion.       Right shoulder: Normal.       Left shoulder: Normal.       Cervical back: Normal.  Lymphadenopathy:    She has no cervical adenopathy.  Neurological: She is alert and oriented to person, place, and time. She has normal reflexes. No cranial nerve deficit. She exhibits normal muscle tone. Coordination normal.  Skin: Skin is warm and dry. No rash noted. She is not diaphoretic. No erythema. No pallor.  Psychiatric: She has a normal mood and affect. Her behavior is normal. Judgment and thought content normal.       Assessment & Plan:  Routine general medical examination at a health care facility - Plan: POCT urinalysis dipstick, EKG 12-Lead, Comprehensive metabolic panel, Vit D  25 hydroxy (rtn osteoporosis monitoring), CBC with Differential, POCT UA - Microscopic Only, IFOBT POC (occult bld, rslt in office), Pap IG (Image Guided), CANCELED: POCT UA - Microscopic Only  Screening for diabetes mellitus - Plan: POCT urinalysis dipstick, EKG 12-Lead, Comprehensive metabolic panel, Vit D  25 hydroxy (rtn osteoporosis monitoring), CBC with Differential, CANCELED: POCT UA - Microscopic Only  Disorder of bone and cartilage, unspecified  Need for prophylactic vaccination and inoculation against influenza - Plan: Flu Vaccine QUAD 36+ mos IM  Need for prophylactic vaccination and inoculation against viral hepatitis - Plan: Hepatitis A vaccine adult IM, Hepatitis B vaccine adult IM  Hematuria - Plan: Urine culture  1. CPE: anticipatory guidance provided --- weight maintenance, exercise.  Pap smear obtained; mammogram UTD.   Colonoscopy UTD.  S/p flu vaccine; s/p Hepatitis B#3, Hepatitis A#2.  Obtain labs. 2.  Gynecological exam: completed; pap smear obtained.  Obtain record for mammogram.   3.  Osteopenia: stable.  Continue exercise, calcium plus vitamin D.  4.  Hematuria: New; send urine culture; if negative, recommend referral to urology. History of hematuria ten years ago with urological work up yet will warrant reevaluation due to degree of hematuria if persists. 5.  Screening for DMII: obtain glucose. 6.  S/p Hepatitis B#3 7.  S/p Hepatitis A#2.  Nilda Simmer, M.D.  [facility] [address] [place], [place]  [address] [phone] phone [phone] fax

## 2013-04-17 LAB — PAP IG (IMAGE GUIDED)

## 2013-04-17 LAB — VITAMIN D 25 HYDROXY (VIT D DEFICIENCY, FRACTURES): Vit D, 25-Hydroxy: 46 ng/mL (ref 30–89)

## 2013-04-17 LAB — URINE CULTURE: Organism ID, Bacteria: NO GROWTH

## 2013-04-18 ENCOUNTER — Encounter: Payer: Self-pay | Admitting: Family Medicine

## 2013-04-22 ENCOUNTER — Telehealth: Payer: Self-pay | Admitting: *Deleted

## 2013-04-22 LAB — LIPID PANEL
HDL: 79 mg/dL (ref >39)
LDL Cholesterol: 138 mg/dL — ABNORMAL HIGH (ref 0–99)
Total CHOL/HDL Ratio: 2.9 Ratio
VLDL: 12 mg/dL (ref 0–40)

## 2013-04-22 NOTE — Telephone Encounter (Signed)
Added lipid panel per Dr Ival Bible.

## 2013-05-01 ENCOUNTER — Encounter: Payer: Self-pay | Admitting: Family Medicine

## 2013-05-06 ENCOUNTER — Encounter: Payer: Self-pay | Admitting: Sports Medicine

## 2013-05-06 ENCOUNTER — Ambulatory Visit (INDEPENDENT_AMBULATORY_CARE_PROVIDER_SITE_OTHER): Payer: Medicare Other | Admitting: Sports Medicine

## 2013-05-06 VITALS — BP 108/71 | Ht 64.0 in | Wt 132.0 lb

## 2013-05-06 DIAGNOSIS — M25579 Pain in unspecified ankle and joints of unspecified foot: Secondary | ICD-10-CM | POA: Insufficient documentation

## 2013-05-06 DIAGNOSIS — R269 Unspecified abnormalities of gait and mobility: Secondary | ICD-10-CM

## 2013-05-06 DIAGNOSIS — M216X9 Other acquired deformities of unspecified foot: Secondary | ICD-10-CM

## 2013-05-06 DIAGNOSIS — M25572 Pain in left ankle and joints of left foot: Secondary | ICD-10-CM

## 2013-05-06 DIAGNOSIS — M7750 Other enthesopathy of unspecified foot: Secondary | ICD-10-CM

## 2013-05-06 NOTE — Assessment & Plan Note (Signed)
Left ankle shows evidence of prior trauma There is some calcific damage to the cartilage along the talar dome on the upper outer corner There is some lateral and anterior impingement with spurring There is a small avulsion fracture that looks old at the anterior lateral malleolus inferiorly  I suggested continuing range of motion exercises but not try to stretch the ankle beyond where it is comfortable Use an ankle compression sleeve for walking and other exercises Continue using orthotics  See if this improves over the next 4-6 weeks and I can recheck if not  Okay to use meloxicam for pain

## 2013-05-06 NOTE — Progress Notes (Signed)
  Subjective:    Patient ID: Kristin Tapia, female    DOB: 11/05/1945, 67 y.o.   MRN: [id number]  HPI  Pt presents to clinic for evaluation of lt ankle pain that got worse 3-4 weeks ago. Has had stiffness in this ankle for about the past yr.  Pain located at lateral malleolus.  Has stiffness all the time, now having sharper pains with activity. Trouble sleeping due to ankle pain.  Has been doing some ankle rehab exercises with theraband.   Takes meloxicam for severe pain.  Enjoys walking, water aerobics, and stretching and strengthening exercises.    Hx of several lt ankle sprains beginning in childhood.   Review of Systems     Objective:   Physical Exam  NAD  Rt Ankle: No visible erythema or swelling. Range of motion is full in all directions. Strength is 5/5 in all directions. Stable lateral and medial ligaments; squeeze test and kleiger test unremarkable; Talar dome nontender; No pain at base of 5th MT; No tenderness over cuboid; No tenderness over N spot or navicular prominence No tenderness on posterior aspects of lateral and medial malleolus No sign of peroneal tendon subluxations; Negative tarsal tunnel tinel's Able to walk 4 steps.  Lt ankle: Eversion lacks 10 degrees Inversion lacks 5-10 degrees Anterior portion of lateral malleolus has clicking and swelling Sinus tarsi is not swollen Fibula is nontender  Ultrasound Left ankle reveals a small avulsion fragment off the anterior inferior aspect of the lateral malleolus This is surrounded by some swelling marked by hypoechoic change The lateral and anterior ankle also has a spur at the tibiotalar articulation Peroneal tendons, posterior tibialis and flexor tendons are normal Talar dome along the lateral aspect reveals calcification within the cartilage but not on the medial aspect      Assessment & Plan:

## 2013-05-12 ENCOUNTER — Telehealth: Payer: Self-pay

## 2013-05-12 NOTE — Telephone Encounter (Signed)
PATIENT WOULD LIKE TO KNOW IF SHE NEEDS TO COME IN FOR HER URINE SAMPLE, SHE WAS TOLD THAT WE WOULD CONTACT HER AND SHE HAS NOT HEARD ANYTHING FROM [place]

## 2013-05-12 NOTE — Telephone Encounter (Signed)
Looks like no one has called for the appt, will try to get this facilitated, called. Marylene Land will get something scheduled.

## 2013-05-12 NOTE — Telephone Encounter (Signed)
Patient advised she needs appt. She needs visit and U/A.

## 2013-05-12 NOTE — Telephone Encounter (Signed)
Called left message, to what is she referring? In the most recent labs it was advised she come in for an appt for recheck and repeat U/A

## 2013-05-26 ENCOUNTER — Encounter: Payer: Self-pay | Admitting: Family Medicine

## 2013-05-26 ENCOUNTER — Ambulatory Visit (INDEPENDENT_AMBULATORY_CARE_PROVIDER_SITE_OTHER): Payer: Medicare Other | Admitting: Family Medicine

## 2013-05-26 VITALS — BP 110/66 | HR 79 | Temp 99.5°F | Resp 16 | Ht 64.0 in | Wt 134.4 lb

## 2013-05-26 DIAGNOSIS — R319 Hematuria, unspecified: Secondary | ICD-10-CM

## 2013-05-26 DIAGNOSIS — E7800 Pure hypercholesterolemia, unspecified: Secondary | ICD-10-CM

## 2013-05-26 DIAGNOSIS — D7289 Other specified disorders of white blood cells: Secondary | ICD-10-CM

## 2013-05-26 LAB — POCT URINALYSIS DIPSTICK
Glucose, UA: NEGATIVE
Nitrite, UA: NEGATIVE
Protein, UA: NEGATIVE
Spec Grav, UA: 1.015
Urobilinogen, UA: 0.2
pH, UA: 8.5

## 2013-05-26 LAB — CBC WITH DIFFERENTIAL/PLATELET
Basophils Absolute: 0 10*3/uL (ref 0.0–0.1)
Basophils Relative: 1 % (ref 0–1)
Eosinophils Relative: 1 % (ref 0–5)
HCT: 38.8 % (ref 36.0–46.0)
Lymphocytes Relative: 29 % (ref 12–46)
Lymphs Abs: 1.1 10*3/uL (ref 0.7–4.0)
MCHC: 34 g/dL (ref 30.0–36.0)
MCV: 92.8 fL (ref 78.0–100.0)
Neutrophils Relative %: 61 % (ref 43–77)
Platelets: 201 10*3/uL (ref 150–400)
RBC: 4.18 MIL/uL (ref 3.87–5.11)
RDW: 12.7 % (ref 11.5–15.5)
WBC: 3.9 10*3/uL — ABNORMAL LOW (ref 4.0–10.5)

## 2013-05-26 LAB — POCT UA - MICROSCOPIC ONLY
Casts, Ur, LPF, POC: NEGATIVE
Crystals, Ur, HPF, POC: NEGATIVE
Yeast, UA: NEGATIVE

## 2013-05-26 NOTE — Progress Notes (Signed)
Subjective:    Patient ID: Kristin Tapia, female    DOB: [birth year], 67 y.o.   MRN: [id number]  HPI This 67 y.o. female presents for six week follow-up of the the following:  1. Hematuria:  Found at CPE six weeks ago; Urine culture negative for UTI as cause of hematuria.  No gross hematuria; no flank pain; no dysuria; no urgency or hesitancy or frequency.  Underwent work up for hematuria microscopic ten years ago; referred to urology by gynecology.  Work up was negative.  Last urologist evaluation ten years ago..  S/p two visits by urology.  2. Leukocytopenia:  Present at CPE; very mild; presenting for repeat labs; low grade fever today; feels well; no acute illness; no cough; no n/v/d; no urinary symptoms. No chills or sweats. Did exercise in pool this morning.  3. Hyperlipidemia: persistent.  Very reluctant to start statin for cholesterol; exercises regularly; eats low-cholesterol foods.  No family history of early CAD. No tobacco history.   Review of Systems  Constitutional: Negative for fever, chills, diaphoresis, activity change, appetite change, fatigue and unexpected weight change.  HENT: Negative for congestion, ear pain, rhinorrhea and sore throat.   Respiratory: Negative for cough and shortness of breath.   Gastrointestinal: Negative for nausea, vomiting and diarrhea.  Genitourinary: Negative for dysuria, urgency, frequency, hematuria and flank pain.  Skin: Negative for rash.  Neurological: Negative for headaches.   Past Medical History  Diagnosis Date  . Allergy   . Arthritis   . GERD (gastroesophageal reflux disease)   . Cataract   . Asthma   . Thyroid disease   . Anxiety   . Osteoporosis   . Restless leg syndrome   . Rectocele   . Cystocele   . Detached retina   . Postmenopausal atrophic vaginitis   . Sciatica of left side 01/17/2010  . Metatarsalgia 03/11/2009  . Cavus deformity of foot, acquired 03/11/2009  . Abnormality of gait 03/11/2009  . Hip pain  01/17/2013   Past Surgical History  Procedure Laterality Date  . Appendectomy    . Colonoscopy  04/2009    Int & Ext Hemorrhoids, L sided diverticula.  Magod.  Repeat 5 years  . Esophagogastroduodenoscopy  04/2009    tiny HH, minimal antritis, gastric polyps. Magod.   Allergies  Allergen Reactions  . Sulfonamide Derivatives    Current Outpatient Prescriptions on File Prior to Visit  Medication Sig Dispense Refill  . Calcium Carbonate-Vitamin D (CALCIUM 600+D3) 600-400 MG-UNIT per tablet Take 1 tablet by mouth 3 (three) times a week.      . cetirizine (ZYRTEC) 10 MG tablet Take 10 mg by mouth at bedtime.      . chlorpheniramine (CHLOR-TRIMETON) 4 MG tablet Take 4 mg by mouth 2 (two) times daily as needed for allergies (when needed 3 to 4 times a week).      . cholecalciferol (VITAMIN D) 400 UNITS TABS Take 400 Units by mouth 3 (three) times a week.      . conjugated estrogens (PREMARIN) vaginal cream Place vaginally daily. This is a compounded RX/ [facility]. [phone]. 1 year supply  42.5 g  12  . cycloSPORINE (RESTASIS) 0.05 % ophthalmic emulsion Place 1 drop into both eyes 2 (two) times daily.      . fexofenadine (ALLEGRA) 180 MG tablet Take 180 mg by mouth every morning.      Marland Kitchen ipratropium (ATROVENT) 0.03 % nasal spray Place 2 sprays into the  nose 3 (three) times daily.      . Magnesium 250 MG TABS Take 1 tablet by mouth daily.      . meloxicam (MOBIC) 15 MG tablet Take 1 tablet (15 mg total) by mouth daily.  30 tablet  5  . Omega-3 Fatty Acids (FISH OIL) 1200 MG CAPS Take 1,000 mg by mouth daily. Patient takes 2 gel caps daily      . Turmeric 500 MG CAPS Take 500 mg by mouth 2 (two) times daily.      Marland Kitchen ALPRAZolam (XANAX) 1 MG tablet Take 0.5 tablets (0.5 mg total) by mouth at bedtime as needed for sleep.  30 tablet  3  . fluconazole (DIFLUCAN) 150 MG tablet Take 1 tablet (150 mg total) by mouth once. Repeat if needed  2 tablet  0   Current  Facility-Administered Medications on File Prior to Visit  Medication Dose Route Frequency Provider Last Rate Last Dose  . hepatitis B vac recombinant (RECOMBIVAX) injection 5 mcg  0.5 mL Intramuscular Once Sondra Barges, PA-C       History   Social History  . Marital Status: Married    Spouse Name: N/A    Number of Children: N/A  . Years of Education: N/A   Occupational History  . Not on file.   Social History Main Topics  . Smoking status: Never Smoker   . Smokeless tobacco: Not on file  . Alcohol Use: No  . Drug Use: No  . Sexual Activity: Yes   Other Topics Concern  . Not on file   Social History Narrative   Marital status:  Married x 45 years; happily married      Children:        Lives:       Employment: retired x 1 year.   Family History  Problem Relation Age of Onset  . Arthritis Mother   . Hypertension Mother   . Dementia Mother   . COPD Father   . Seizures Brother        Objective:   Physical Exam  Nursing note and vitals reviewed. Constitutional: She is oriented to person, place, and time. She appears well-developed and well-nourished. No distress.  HENT:  Head: Normocephalic and atraumatic.  Eyes: Conjunctivae are normal. Pupils are equal, round, and reactive to light.  Pulmonary/Chest: Effort normal.  Neurological: She is alert and oriented to person, place, and time.  Skin: She is not diaphoretic.  Psychiatric: She has a normal mood and affect. Her behavior is normal.       Results for orders placed in visit on 05/26/13  POCT URINALYSIS DIPSTICK      Result Value Range   Color, UA yellow     Clarity, UA clear     Glucose, UA neg     Bilirubin, UA neg     Ketones, UA neg     Spec Grav, UA 1.015     Blood, UA small     pH, UA 8.5     Protein, UA neg     Urobilinogen, UA 0.2     Nitrite, UA neg     Leukocytes, UA Negative    POCT UA - MICROSCOPIC ONLY      Result Value Range   WBC, Ur, HPF, POC 0-1     RBC, urine, microscopic 5-11      Bacteria, U Microscopic 1+     Mucus, UA trace     Epithelial cells, urine per micros 0-2  Crystals, Ur, HPF, POC neg     Casts, Ur, LPF, POC neg     Yeast, UA neg      Assessment & Plan:  Hematuria - Plan: CBC with Differential, POCT urinalysis dipstick, POCT UA - Microscopic Only, Ambulatory referral to Urology  Other specified disease of white blood cells  Pure hypercholesterolemia   1. Hematuria microscopic: New at visit and present today; refer to urology; has undergone urological evaluation ten years ago for hematuria microscopic.  Recommend reevaluation to confirm no pathology. 2.  Leukocytopenia: New.  Repeat today.  Asymptomatic. 3.  Hyperlipidemia: persistent; continue with lifestyle modification, exercise, weight maintenance.  Does not warrant statin at this time.

## 2013-05-30 ENCOUNTER — Encounter: Payer: Self-pay | Admitting: Family Medicine

## 2013-06-20 ENCOUNTER — Ambulatory Visit (INDEPENDENT_AMBULATORY_CARE_PROVIDER_SITE_OTHER): Payer: Medicare Other | Admitting: Emergency Medicine

## 2013-06-20 ENCOUNTER — Ambulatory Visit: Payer: Medicare Other

## 2013-06-20 VITALS — BP 118/65 | HR 86 | Temp 98.9°F | Resp 16 | Ht 64.0 in | Wt 133.0 lb

## 2013-06-20 DIAGNOSIS — R059 Cough, unspecified: Secondary | ICD-10-CM

## 2013-06-20 DIAGNOSIS — R05 Cough: Secondary | ICD-10-CM

## 2013-06-20 DIAGNOSIS — J209 Acute bronchitis, unspecified: Secondary | ICD-10-CM

## 2013-06-20 MED ORDER — AZITHROMYCIN 250 MG PO TABS: ORAL_TABLET | ORAL | Status: DC

## 2013-06-20 NOTE — Patient Instructions (Signed)

## 2013-06-20 NOTE — Progress Notes (Signed)
[facility] [address], [place] [address] [phone]  Date:  06/20/2013   Name:  Kristin Tapia   DOB:  02/17/1946   MRN:  [id number]  PCP:  Nilda Simmer, MD    Chief Complaint: Cough   History of Present Illness:  Kristin Tapia is a 67 y.o. very pleasant female patient who presents with the following:  Ill for 9 days with cough productive of mucopurulent sputum. Has nasal congestion and mucoid drainage.  Has marked post nasal drainage.  No fever or chills.  Sore throat.  Some expiratory wheeze with no shortness of breath.  Cough  Is worse at night.  No nausea or vomiting. No stool change or rash.  Some improvement in cough with delsym.  No peripheral edema or chest pain.  No travel.  No improvement with over the counter medications or other home remedies. Denies other complaint or health concern today.   Patient Active Problem List   Diagnosis Date Noted  . Pain in joint, ankle and foot 05/06/2013  . Hip pain 01/17/2013  . Screening for lipoid disorders 05/31/2012  . Screening for diabetes mellitus 05/31/2012  . Routine general medical examination at a health care facility 05/31/2012  . Need for prophylactic vaccination and inoculation against influenza 05/31/2012  . SCIATICA, LEFT 01/17/2010  . METATARSALGIA 03/11/2009  . CAVUS DEFORMITY OF FOOT, ACQUIRED 03/11/2009  . ABNORMALITY OF GAIT 03/11/2009    Past Medical History  Diagnosis Date  . Allergy   . Arthritis   . GERD (gastroesophageal reflux disease)   . Cataract   . Asthma   . Thyroid disease   . Anxiety   . Osteoporosis   . Restless leg syndrome   . Rectocele   . Cystocele   . Detached retina   . Postmenopausal atrophic vaginitis   . Sciatica of left side 01/17/2010  . Metatarsalgia 03/11/2009  . Cavus deformity of foot, acquired 03/11/2009  . Abnormality of gait 03/11/2009  . Hip pain 01/17/2013    Past Surgical History  Procedure Laterality Date  . Appendectomy    .  Colonoscopy  04/2009    Int & Ext Hemorrhoids, L sided diverticula.  Magod.  Repeat 5 years  . Esophagogastroduodenoscopy  04/2009    tiny HH, minimal antritis, gastric polyps. Magod.    History  Substance Use Topics  . Smoking status: Never Smoker   . Smokeless tobacco: Not on file  . Alcohol Use: No    Family History  Problem Relation Age of Onset  . Arthritis Mother   . Hypertension Mother   . Dementia Mother   . COPD Father   . Seizures Brother     Allergies  Allergen Reactions  . Sulfonamide Derivatives     Medication list has been reviewed and updated.  Current Outpatient Prescriptions on File Prior to Visit  Medication Sig Dispense Refill  . ALPRAZolam (XANAX) 1 MG tablet Take 0.5 tablets (0.5 mg total) by mouth at bedtime as needed for sleep.  30 tablet  3  . Calcium Carbonate-Vitamin D (CALCIUM 600+D3) 600-400 MG-UNIT per tablet Take 1 tablet by mouth 3 (three) times a week.      . cetirizine (ZYRTEC) 10 MG tablet Take 10 mg by mouth at bedtime.      . chlorpheniramine (CHLOR-TRIMETON) 4 MG tablet Take 4 mg by mouth 2 (two) times daily as needed for allergies (when needed 3 to 4 times a week).      Marland Kitchen  cholecalciferol (VITAMIN D) 400 UNITS TABS Take 400 Units by mouth 3 (three) times a week.      . conjugated estrogens (PREMARIN) vaginal cream Place vaginally daily. This is a compounded RX/ [facility]. [phone]. 1 year supply  42.5 g  12  . cycloSPORINE (RESTASIS) 0.05 % ophthalmic emulsion Place 1 drop into both eyes 2 (two) times daily.      . fexofenadine (ALLEGRA) 180 MG tablet Take 180 mg by mouth every morning.      . fluconazole (DIFLUCAN) 150 MG tablet Take 1 tablet (150 mg total) by mouth once. Repeat if needed  2 tablet  0  . ipratropium (ATROVENT) 0.03 % nasal spray Place 2 sprays into the nose 3 (three) times daily.      . Magnesium 250 MG TABS Take 1 tablet by mouth daily.      . meloxicam (MOBIC) 15 MG tablet Take 1 tablet (15  mg total) by mouth daily.  30 tablet  5  . Omega-3 Fatty Acids (FISH OIL) 1200 MG CAPS Take 1,000 mg by mouth daily. Patient takes 2 gel caps daily      . Turmeric 500 MG CAPS Take 500 mg by mouth 2 (two) times daily.       Current Facility-Administered Medications on File Prior to Visit  Medication Dose Route Frequency Provider Last Rate Last Dose  . hepatitis B vac recombinant (RECOMBIVAX) injection 5 mcg  0.5 mL Intramuscular Once Sondra Barges, PA-C        Review of Systems:  As per HPI, otherwise negative.    Physical Examination: Filed Vitals:   06/20/13 1551  BP: 118/65  Pulse: 86  Temp: 98.9 F (37.2 C)  Resp: 16   Filed Vitals:   06/20/13 1551  Height: 5\' 4"  (1.626 m)  Weight: 133 lb (60.328 kg)   Body mass index is 22.82 kg/(m^2). Ideal Body Weight: Weight in (lb) to have BMI = 25: 145.3  GEN: WDWN, NAD, Non-toxic, A & O x 3 HEENT: Atraumatic, Normocephalic. Neck supple. No masses, No LAD. Ears and Nose: No external deformity. CV: RRR, No M/G/R. No JVD. No thrill. No extra heart sounds. PULM: CTA B, no wheezes, crackles, rhonchi. No retractions. No resp. distress. No accessory muscle use. ABD: S, NT, ND, +BS. No rebound. No HSM. EXTR: No c/c/e NEURO Normal gait.  PSYCH: Normally interactive. Conversant. Not depressed or anxious appearing.  Calm demeanor.    Assessment and Plan: Bronchitis Continue delsym Declined antibiotic  Signed,  Phillips Odor, MD   [facility] reading (PRIMARY) by  Dr. Dareen Piano. Negative chest.

## 2013-06-21 ENCOUNTER — Other Ambulatory Visit: Payer: Self-pay | Admitting: Emergency Medicine

## 2013-06-21 ENCOUNTER — Other Ambulatory Visit: Payer: Self-pay | Admitting: Internal Medicine

## 2013-06-21 DIAGNOSIS — R222 Localized swelling, mass and lump, trunk: Secondary | ICD-10-CM

## 2013-06-21 DIAGNOSIS — R918 Other nonspecific abnormal finding of lung field: Secondary | ICD-10-CM

## 2013-06-21 NOTE — Progress Notes (Signed)
See rad rev cxr 12/26--?RUL Mass

## 2013-06-23 ENCOUNTER — Telehealth: Payer: Self-pay | Admitting: Radiology

## 2013-06-23 ENCOUNTER — Other Ambulatory Visit (HOSPITAL_COMMUNITY): Payer: Self-pay | Admitting: Urology

## 2013-06-23 ENCOUNTER — Ambulatory Visit
Admission: RE | Admit: 2013-06-23 | Discharge: 2013-06-23 | Disposition: A | Discharge status: Home / Self Care | Payer: Medicare Other | Source: Ambulatory Visit | Attending: Emergency Medicine | Admitting: Emergency Medicine

## 2013-06-23 DIAGNOSIS — N281 Cyst of kidney, acquired: Secondary | ICD-10-CM

## 2013-06-23 DIAGNOSIS — R222 Localized swelling, mass and lump, trunk: Secondary | ICD-10-CM

## 2013-06-23 NOTE — Telephone Encounter (Signed)
Called patient advised her she can go for the CT scan at her convenience at [address] I have gotten precert from [employer].

## 2013-06-23 NOTE — Telephone Encounter (Signed)
Message copied by Caffie Damme on Mon Jun 23, 2013  3:01 PM ------      Message from: [place], [place] S      Created: Mon Jun 23, 2013  2:58 PM       Please coordinate with urology ------

## 2013-06-23 NOTE — Telephone Encounter (Signed)
Called Urology she is scheduled for the CT abdomen / pelvis on 12/31 have faxed them a copy of the CT chest and Dr McDermid will advise on what study would be best so she does not have to under go CT and MRI unless absolutely necessary. Patient aware of results.

## 2013-06-23 NOTE — Telephone Encounter (Signed)
Spoke to [facility] Delice Bison), Dr McDermid agrees patient needs a MRI not CT. They will cancel the CT and call patient to schedule her for the MRI scan. They will call patient now to schedule. To you FYI

## 2013-06-27 ENCOUNTER — Ambulatory Visit (HOSPITAL_COMMUNITY)
Admission: RE | Admit: 2013-06-27 | Discharge: 2013-06-27 | Disposition: A | Discharge status: Home / Self Care | Payer: Medicare Other | Source: Ambulatory Visit | Attending: Urology | Admitting: Urology

## 2013-06-27 DIAGNOSIS — N289 Disorder of kidney and ureter, unspecified: Secondary | ICD-10-CM | POA: Insufficient documentation

## 2013-06-27 DIAGNOSIS — N281 Cyst of kidney, acquired: Secondary | ICD-10-CM

## 2013-06-27 DIAGNOSIS — K869 Disease of pancreas, unspecified: Secondary | ICD-10-CM | POA: Insufficient documentation

## 2013-06-27 MED ORDER — GADOBENATE DIMEGLUMINE 529 MG/ML IV SOLN
12.0000 mL | Freq: Once | INTRAVENOUS | Status: AC | PRN
  Administered 2013-06-27: 12 mL via INTRAVENOUS

## 2013-07-17 ENCOUNTER — Other Ambulatory Visit: Payer: Self-pay

## 2013-07-17 NOTE — Telephone Encounter (Signed)
Pharm reqs Rf of estradiol hrt 0.02% cream. I have pended the estrogen cream in chart for review but not sure if it is the same as estrodiol.

## 2013-07-22 MED ORDER — ESTROGENS, CONJUGATED 0.625 MG/GM VA CREA: TOPICAL_CREAM | Freq: Every day | VAGINAL | Status: DC

## 2013-07-22 NOTE — Telephone Encounter (Signed)
I approved estrogen cream in chart.  Please call [facility] to confirm that correct rx. If not, please give verbal order to refill PRN one year.

## 2013-07-22 NOTE — Telephone Encounter (Signed)
[facility] called to check status of Rx and clarified that pt is asking for the estrodiol, not premarin that is in EPIC. Dr Tamala Julian, please review. I couldn't find in system to pend and wasn't sure how you order this.

## 2013-07-23 NOTE — Telephone Encounter (Signed)
Check w/pharm who reported they have the Rx and can fill what pt has been using.

## 2013-09-11 ENCOUNTER — Encounter: Payer: Self-pay | Admitting: Family Medicine

## 2013-11-11 ENCOUNTER — Encounter: Payer: Self-pay | Admitting: Family Medicine

## 2013-11-22 ENCOUNTER — Encounter: Payer: Self-pay | Admitting: Family Medicine

## 2013-11-22 DIAGNOSIS — M8580 Other specified disorders of bone density and structure, unspecified site: Secondary | ICD-10-CM

## 2013-12-22 ENCOUNTER — Other Ambulatory Visit: Payer: Self-pay | Admitting: Family Medicine

## 2014-01-22 ENCOUNTER — Telehealth: Payer: Self-pay | Admitting: Radiology

## 2014-01-22 NOTE — Telephone Encounter (Signed)
Message copied by Candice Camp on Thu Jan 22, 2014 10:36 AM ------      Message from: Lowry Ram      Created: Tue Jan 13, 2014 10:11 AM      Regarding: HQPAF       Ms. Meskill has an OV scheduled for 05/18/14 @ 0830 with you.  Unfortunately, I have no 2015 documentation, so I need more than usual...  I need the following addressed @ her OV.            EARLY DETECTION           Cognitive Function           Depression           Physical Activity           Risk of Fall            PREVENTIVE MEDICINE SCREENING           BMI (with height & weight)           Breast Ca Screening (last noted 04/16/12-has she scheduled                  upcoming due?)            ONGOING AX & EVAL           Esophageal Reflux & other d/o of Esophagus (last reported 2014)           D/O of Lipid Metab (2014)           COPD/Asthma (2014)            Thank you,      Lowry Ram, RN       ------

## 2014-01-22 NOTE — Telephone Encounter (Signed)
Noted will address at visit

## 2014-03-05 ENCOUNTER — Ambulatory Visit (INDEPENDENT_AMBULATORY_CARE_PROVIDER_SITE_OTHER): Payer: Medicare Other | Admitting: Sports Medicine

## 2014-03-05 ENCOUNTER — Encounter: Payer: Self-pay | Admitting: Sports Medicine

## 2014-03-05 VITALS — BP 110/69 | HR 70 | Ht 65.0 in | Wt 132.0 lb

## 2014-03-05 DIAGNOSIS — M7710 Lateral epicondylitis, unspecified elbow: Secondary | ICD-10-CM

## 2014-03-05 DIAGNOSIS — M7711 Lateral epicondylitis, right elbow: Secondary | ICD-10-CM | POA: Insufficient documentation

## 2014-03-05 MED ORDER — NITROGLYCERIN 0.2 MG/HR TD PT24: MEDICATED_PATCH | TRANSDERMAL | Status: DC

## 2014-03-05 NOTE — Patient Instructions (Signed)
Place 1/4 patch topical nitroglycerine daily.  Do exercises discussed.  F/u in 6 weeks

## 2014-03-05 NOTE — Progress Notes (Signed)
Patient presents with 4 months of progressive right elbow pain. No known injury. Worse with supination, lifting and rotating as with turning doorknob. Better with tennis elbow strap and deep tissue massages.  She really felt this carrying heavier water bottles.  Feels weak in RT arm and notes pain with opening door knob, holding cup, etc  Exam  Gen, thin fit F in NAD BP 110/69  Pulse 70  Ht 5\' 5"  (1.651 m)  Wt 132 lb (59.875 kg)  BMI 21.97 kg/m2   On exam she has tenderness over wrist extensor muscle belly and lateral epicondyle. Pain is worsening with extension of 3rd finger against resistance. Positive book test.  Ultrasound confirms extensor tendinopathy with small effusion and mild calcification at the insertion. No tears noted.  No neovessels. Fluid in tendon sheath

## 2014-03-05 NOTE — Assessment & Plan Note (Addendum)
Subacute - continue pressure band with lifting or heavy arm use - topical nitroglycerine protocol - tennis elbow exercises given - f/u in 6 weeks

## 2014-03-09 LAB — HM MAMMOGRAPHY

## 2014-03-16 ENCOUNTER — Ambulatory Visit (INDEPENDENT_AMBULATORY_CARE_PROVIDER_SITE_OTHER): Payer: Medicare Other

## 2014-03-16 DIAGNOSIS — Z23 Encounter for immunization: Secondary | ICD-10-CM

## 2014-04-16 ENCOUNTER — Ambulatory Visit (INDEPENDENT_AMBULATORY_CARE_PROVIDER_SITE_OTHER): Payer: Medicare Other | Admitting: Sports Medicine

## 2014-04-16 ENCOUNTER — Encounter: Payer: Self-pay | Admitting: Sports Medicine

## 2014-04-16 ENCOUNTER — Encounter: Payer: Self-pay | Admitting: Family Medicine

## 2014-04-16 ENCOUNTER — Ambulatory Visit: Payer: Medicare Other | Admitting: Sports Medicine

## 2014-04-16 VITALS — BP 112/64 | HR 75 | Ht 65.0 in | Wt 132.0 lb

## 2014-04-16 DIAGNOSIS — M7711 Lateral epicondylitis, right elbow: Secondary | ICD-10-CM

## 2014-04-16 NOTE — Progress Notes (Signed)
History was provided by the patient.  HPI:  Kristin Tapia is a 68 y.o. female who is here for follow up of right elbow pain secondary to lateral epicondylitis. Kristin Tapia reports improvement in pain,strength, and range of motion. She reports that pain is persistent and worsens with extensor exercises (lifting luggage, picking up buckets of water). She has continued exercises twice daily. She is also participating in water aerobics. She administers meloxicam 2-3 times weekly for pain secondary to hip arthritis and believes this is also helpful for elbow pain. She tried administering nitroglyercin patches x 5 days but was unable to tolerate this due to headache.   Ultrasound 03/05/14: Ultrasound confirms extensor tendinopathy with small effusion and mild calcification at the insertion. No tears noted. No neovessels. Fluid in tendon sheath.   Physical Exam:  BP 112/64  Pulse 75  Ht 5\' 5"  (1.651 m)  Wt 132 lb (59.875 kg)  BMI 21.97 kg/m2   General:   alert, sitting upright in examination chair, well appearing  Skin:   No overlying erythema, effusion, or ecchymosis to right upper extremity  Musculoskeletal:   No muscular asymmetry of right upper extremity compared to left. Right upper extremity tender to palpation at lateral epicondyle and along distribution of extensor carpi radialis brevis. Pain with extension of 3rd finger against resistance. Extensor strength 5/5 bilaterally, but with pain. Pronation and supination intact. Positive book test.   Neuro:  Neurovascularly intact    Assessment/Plan: improved lateral epidondylitis -Counseled to administer OTC anti-inflammatory arnica cream.  -Continue current exercise regimen -Apply compressive elbow sleeve with exertional activities (yard work).  - Follow up with Dr. Oneida Alar in 6 weeks. Will obtain repeat ultrasound follow up appointment.   Kristin Moloney, MD  04/16/2014  Reviewed and edited [name], MD

## 2014-04-21 NOTE — Assessment & Plan Note (Signed)
There is at least 50% improvement on this visit  Unable to tolerate NTG so we will try topicals and HEP to continue

## 2014-04-28 ENCOUNTER — Other Ambulatory Visit: Payer: Self-pay | Admitting: Family Medicine

## 2014-04-29 NOTE — Telephone Encounter (Signed)
Dr Tamala Julian you have not seen pt since last Dec and haven't addressed this med that I see since 04/16/14, but pt does have an appt sch for 05/18/14. Do you want to give a RF?

## 2014-04-29 NOTE — Telephone Encounter (Signed)
Please call in Xanax refill as approved. 

## 2014-04-29 NOTE — Telephone Encounter (Signed)
Called in.

## 2014-05-18 ENCOUNTER — Ambulatory Visit (INDEPENDENT_AMBULATORY_CARE_PROVIDER_SITE_OTHER): Payer: Medicare Other | Admitting: Family Medicine

## 2014-05-18 ENCOUNTER — Encounter: Payer: Self-pay | Admitting: Family Medicine

## 2014-05-18 VITALS — BP 120/72 | HR 77 | Temp 98.1°F | Resp 16 | Ht 64.5 in | Wt 133.4 lb

## 2014-05-18 DIAGNOSIS — Z026 Encounter for examination for insurance purposes: Secondary | ICD-10-CM

## 2014-05-18 DIAGNOSIS — M150 Primary generalized (osteo)arthritis: Secondary | ICD-10-CM

## 2014-05-18 DIAGNOSIS — N3941 Urge incontinence: Secondary | ICD-10-CM

## 2014-05-18 DIAGNOSIS — E7800 Pure hypercholesterolemia, unspecified: Secondary | ICD-10-CM

## 2014-05-18 DIAGNOSIS — M8580 Other specified disorders of bone density and structure, unspecified site: Secondary | ICD-10-CM

## 2014-05-18 DIAGNOSIS — Z23 Encounter for immunization: Secondary | ICD-10-CM

## 2014-05-18 DIAGNOSIS — K863 Pseudocyst of pancreas: Secondary | ICD-10-CM

## 2014-05-18 DIAGNOSIS — Z209 Contact with and (suspected) exposure to unspecified communicable disease: Secondary | ICD-10-CM

## 2014-05-18 DIAGNOSIS — M159 Polyosteoarthritis, unspecified: Secondary | ICD-10-CM

## 2014-05-18 DIAGNOSIS — Z0000 Encounter for general adult medical examination without abnormal findings: Secondary | ICD-10-CM

## 2014-05-18 DIAGNOSIS — Z131 Encounter for screening for diabetes mellitus: Secondary | ICD-10-CM

## 2014-05-18 LAB — LIPID PANEL
Cholesterol: 188 mg/dL (ref 0–200)
HDL: 82 mg/dL (ref >39)
LDL CALC: 98 mg/dL (ref 0–99)
Total CHOL/HDL Ratio: 2.3 Ratio
Triglycerides: 40 mg/dL (ref <150)
VLDL: 8 mg/dL (ref 0–40)

## 2014-05-18 LAB — CBC WITH DIFFERENTIAL/PLATELET
Basophils Absolute: 0 10*3/uL (ref 0.0–0.1)
Basophils Relative: 1 % (ref 0–1)
EOS ABS: 0.1 10*3/uL (ref 0.0–0.7)
EOS PCT: 3 % (ref 0–5)
HCT: 38.3 % (ref 36.0–46.0)
HEMOGLOBIN: 13.8 g/dL (ref 12.0–15.0)
LYMPHS ABS: 1 10*3/uL (ref 0.7–4.0)
Lymphocytes Relative: 27 % (ref 12–46)
MCH: 32.5 pg (ref 26.0–34.0)
MCHC: 36 g/dL (ref 30.0–36.0)
MCV: 90.3 fL (ref 78.0–100.0)
MONO ABS: 0.4 10*3/uL (ref 0.1–1.0)
MPV: 9.3 fL — AB (ref 9.4–12.4)
Monocytes Relative: 11 % (ref 3–12)
Neutro Abs: 2.2 10*3/uL (ref 1.7–7.7)
Neutrophils Relative %: 58 % (ref 43–77)
PLATELETS: 269 10*3/uL (ref 150–400)
RBC: 4.24 MIL/uL (ref 3.87–5.11)
RDW: 12.7 % (ref 11.5–15.5)
WBC: 3.8 10*3/uL — ABNORMAL LOW (ref 4.0–10.5)

## 2014-05-18 LAB — POCT URINALYSIS DIPSTICK
BILIRUBIN UA: NEGATIVE
Glucose, UA: NEGATIVE
KETONES UA: NEGATIVE
NITRITE UA: NEGATIVE
Protein, UA: NEGATIVE
Spec Grav, UA: 1.015
Urobilinogen, UA: 0.2
pH, UA: 7

## 2014-05-18 LAB — POCT UA - MICROSCOPIC ONLY
Casts, Ur, LPF, POC: NEGATIVE
Crystals, Ur, HPF, POC: NEGATIVE
MUCUS UA: POSITIVE
Yeast, UA: NEGATIVE

## 2014-05-18 LAB — COMPLETE METABOLIC PANEL WITH GFR
ALT: 33 U/L (ref 0–35)
AST: 27 U/L (ref 0–37)
Albumin: 3.9 g/dL (ref 3.5–5.2)
Alkaline Phosphatase: 44 U/L (ref 39–117)
BILIRUBIN TOTAL: 0.5 mg/dL (ref 0.2–1.2)
BUN: 18 mg/dL (ref 6–23)
CO2: 32 mEq/L (ref 19–32)
CREATININE: 0.83 mg/dL (ref 0.50–1.10)
Calcium: 9.4 mg/dL (ref 8.4–10.5)
Chloride: 103 mEq/L (ref 96–112)
GFR, EST NON AFRICAN AMERICAN: 73 mL/min
GFR, Est African American: 84 mL/min
Glucose, Bld: 100 mg/dL — ABNORMAL HIGH (ref 70–99)
Potassium: 4 mEq/L (ref 3.5–5.3)
Sodium: 142 mEq/L (ref 135–145)
Total Protein: 6.6 g/dL (ref 6.0–8.3)

## 2014-05-18 LAB — HEPATITIS C ANTIBODY: HCV Ab: NEGATIVE

## 2014-05-18 MED ORDER — MELOXICAM 15 MG PO TABS: 15.0000 mg | ORAL_TABLET | Freq: Every day | ORAL | Status: DC

## 2014-05-18 NOTE — Patient Instructions (Signed)

## 2014-05-18 NOTE — Progress Notes (Signed)
Subjective:    Patient ID: Kristin Tapia, female    DOB: 24-Dec-1945, 68 y.o.   MRN: [id number]  05/18/2014  Annual Exam   HPI This 68 y.o. female presents for Annual Wellness Examination and Complete Physical Examination.  Last physical:  04/16/2013 Pap smear:  04/16/2013 Mammogram:  03/10/2014 Colonoscopy:  2010; repeat in 10 years. Bone density:  03/10/2014 TDAP:  2010 Pneumovax:  2012 Zostavax:   2009 Influenza:   03/16/2014 Eye exam:  2015; +glasses.   Dental exam:  Every six months.     Hematuria:  S/p urology evaluation; negative work up.  No further work up warranted with urology.  No visualized gross hematuria.    Pancreatic pseudocyst:  Recommended repeat MRI one year.  Due for imaging now.   Urge Incontinence:  Has occurred twice in the past month; usually occurs twice per year.  Occurs as going to the restroom and cannot quite hold it.  Occurred on recent trip.  No recurrence since returning home.  Interested in PT for urge incontinence before trying medication; also wants to be aggressive with physical therapy. Performs Kiegel exercises regularly.    Toe coldness, numbness, stiffness:  Nighttime symptoms, daytime symptoms.   Review of Systems  Constitutional: Negative for fever, chills, diaphoresis, activity change, appetite change, fatigue and unexpected weight change.  HENT: Positive for postnasal drip and rhinorrhea. Negative for congestion, dental problem, drooling, ear discharge, ear pain, facial swelling, hearing loss, mouth sores, nosebleeds, sinus pressure, sneezing, sore throat, tinnitus, trouble swallowing and voice change.   Eyes: Negative for photophobia, pain, discharge, redness, itching and visual disturbance.  Respiratory: Negative for apnea, cough, choking, chest tightness, shortness of breath, wheezing and stridor.   Cardiovascular: Negative for chest pain, palpitations and leg swelling.  Gastrointestinal: Negative for nausea, vomiting, abdominal  pain, diarrhea, constipation, blood in stool, abdominal distention, anal bleeding and rectal pain.  Endocrine: Negative for cold intolerance, heat intolerance, polydipsia, polyphagia and polyuria.  Genitourinary: Positive for urgency. Negative for dysuria, frequency, hematuria, flank pain, decreased urine volume, vaginal bleeding, vaginal discharge, enuresis, difficulty urinating, genital sores, vaginal pain, menstrual problem, pelvic pain and dyspareunia.  Musculoskeletal: Positive for back pain, arthralgias and neck stiffness. Negative for myalgias, joint swelling, gait problem and neck pain.  Skin: Negative for color change, pallor, rash and wound.  Allergic/Immunologic: Positive for environmental allergies. Negative for food allergies and immunocompromised state.  Neurological: Positive for numbness. Negative for dizziness, tremors, seizures, syncope, facial asymmetry, speech difficulty, weakness, light-headedness and headaches.  Hematological: Negative for adenopathy. Bruises/bleeds easily.  Psychiatric/Behavioral: Positive for sleep disturbance. Negative for suicidal ideas, hallucinations, behavioral problems, confusion, self-injury, dysphoric mood, decreased concentration and agitation. The patient is not nervous/anxious and is not hyperactive.     Past Medical History  Diagnosis Date  . Arthritis   . GERD (gastroesophageal reflux disease)   . Cataract   . Thyroid disease   . Anxiety   . Osteoporosis   . Rectocele   . Cystocele   . Detached retina   . Postmenopausal atrophic vaginitis   . Sciatica of left side 01/17/2010  . Metatarsalgia 03/11/2009  . Cavus deformity of foot, acquired 03/11/2009  . Abnormality of gait 03/11/2009  . Hip pain 01/17/2013  . Allergy     immunotherapy/Sharma Adell.  . Asthma     childhood  . Restless leg syndrome     L leg; s/p consultation by Dohmeier/Neurology.   Past Surgical History  Procedure Laterality Date  . Appendectomy    .  Colonoscopy  04/2009    Int & Ext Hemorrhoids, L sided diverticula.  Magod.  Repeat 5 years  . Esophagogastroduodenoscopy  04/2009    tiny HH, minimal antritis, gastric polyps. Magod.  . Tonsillectomy    . Myomectomy      [place].   Allergies  Allergen Reactions  . Sulfonamide Derivatives    Current Outpatient Prescriptions  Medication Sig Dispense Refill  . ALPRAZolam (XANAX) 1 MG tablet TAKE 0.5 TABLET AT BEDTIME AS NEEDED FOR SLEEP 30 tablet 0  . Calcium Carbonate-Vitamin D (CALCIUM 600+D3) 600-400 MG-UNIT per tablet Take 1 tablet by mouth 3 (three) times a week.    . cetirizine (ZYRTEC) 10 MG tablet Take 10 mg by mouth at bedtime.    . cholecalciferol (VITAMIN D) 400 UNITS TABS Take 400 Units by mouth 3 (three) times a week.    . conjugated estrogens (PREMARIN) vaginal cream Place vaginally daily. This is a compounded RX/ [facility] [phone]. 1 year supply.  0.02% 42.5 g 12  . cycloSPORINE (RESTASIS) 0.05 % ophthalmic emulsion Place 1 drop into both eyes 2 (two) times daily.    . fexofenadine (ALLEGRA) 180 MG tablet Take 180 mg by mouth every morning.    . fluticasone (FLONASE) 50 MCG/ACT nasal spray Place into both nostrils daily.    Marland Kitchen ipratropium (ATROVENT) 0.03 % nasal spray Place 2 sprays into the nose 3 (three) times daily.    . Magnesium 250 MG TABS Take 1 tablet by mouth daily.    . Omega-3 Fatty Acids (FISH OIL) 1200 MG CAPS Take 1,000 mg by mouth daily. Patient takes 2 gel caps daily    . Turmeric 500 MG CAPS Take 500 mg by mouth 2 (two) times daily.    . meloxicam (MOBIC) 15 MG tablet Take 1 tablet (15 mg total) by mouth daily. 30 tablet 11  . nitroGLYCERIN (MINITRAN) 0.2 mg/hr patch Place 1/4 patch to the elbow daily and replace next day. 30 patch 12   No current facility-administered medications for this visit.       Objective:    BP 120/72 mmHg  Pulse 77  Temp(Src) 98.1 F (36.7 C) (Oral)  Resp 16  Ht 5' 4.5" (1.638 m)  Wt 133  lb 6.4 oz (60.51 kg)  BMI 22.55 kg/m2  SpO2 98% Physical Exam  Constitutional: She is oriented to person, place, and time. She appears well-developed and well-nourished. No distress.  HENT:  Head: Normocephalic and atraumatic.  Right Ear: External ear normal.  Left Ear: External ear normal.  Nose: Nose normal.  Mouth/Throat: Oropharynx is clear and moist.  Eyes: Conjunctivae and EOM are normal. Pupils are equal, round, and reactive to light.  Neck: Normal range of motion and full passive range of motion without pain. Neck supple. No JVD present. Carotid bruit is not present. No thyromegaly present.  Cardiovascular: Normal rate, regular rhythm and normal heart sounds.  Exam reveals no gallop and no friction rub.   No murmur heard. Pulmonary/Chest: Effort normal and breath sounds normal. She has no wheezes. She has no rales. Right breast exhibits no inverted nipple, no mass, no nipple discharge, no skin change and no tenderness. Left breast exhibits no inverted nipple, no mass, no nipple discharge, no skin change and no tenderness. Breasts are symmetrical.  Abdominal: Soft. Bowel sounds are normal. She exhibits no distension and no mass. There is no tenderness. There is no rebound and no guarding.  Genitourinary: Uterus normal. There is  no rash, tenderness or lesion on the right labia. There is no rash, tenderness or lesion on the left labia. Cervix exhibits no motion tenderness, no discharge and no friability. Right adnexum displays no mass, no tenderness and no fullness. Left adnexum displays no mass, no tenderness and no fullness. No erythema, tenderness or bleeding in the vagina. No foreign body around the vagina. No signs of injury around the vagina. No vaginal discharge found.  Musculoskeletal:       Right shoulder: Normal.       Left shoulder: Normal.       Cervical back: Normal.  Lymphadenopathy:    She has no cervical adenopathy.  Neurological: She is alert and oriented to person,  place, and time. She has normal reflexes. No cranial nerve deficit. She exhibits normal muscle tone. Coordination normal.  Skin: Skin is warm and dry. No rash noted. She is not diaphoretic. No erythema. No pallor.  Psychiatric: She has a normal mood and affect. Her behavior is normal. Judgment and thought content normal.  Nursing note and vitals reviewed.   PREVNAR-13 ADMINISTERED IN OFFICE.    Assessment & Plan:   1. Medicare annual wellness visit, subsequent   2. Pure hypercholesterolemia   3. Screening for diabetes mellitus   4. Urge incontinence   5. History of exposure to communicable disease   6. Pancreatic pseudocyst   7. Primary osteoarthritis involving multiple joints      1. Medicare Annual Wellness Examination and Complete Physical Examination: anticipatory guidance --- continued exercise and weight maintenance. Pap smear UTD. Mammogram and bone density UTD. Colonoscopy UTD.   S/p Prevnar in office.  No hearing loss.  Low fall risk.  No evidence of depression.  Independent with ADLs.  Has living will and will bring copy to office. 2.  Hypercholesterolemia: stable; obtain labs; continue with dietary modification. 3.  Screening DMII: obtain glucose. 4.  Urge Incontinence: worsening; obtain urine culture; refer for PT. 5.  Screening for hepatitis C in all baby boomers: obtain Hep C ab. 6.  Pancreatic pseudocyst: New; detected on imaging study last year while working up hematuria; due for repeat MRI abdomen.   7. Hematuria: stable; s/p urology evaluation last year; benign work up.  No further work up or follow-up warranted. 8. OA: stable; refill of Mobic provided; using PRN at this time; discussed risk and benefits of therapy.  Obtain BUN/Cr yearly. 9. Osteopenia: stable; continue with exercise, calcium plus D and daily exercise.  Bone density scan UTD. 10. Feet pain with numbness and stiffness: New.  Recommend discussing with sports medicine; may have some OA but cannot rule out  PN.  May warrant NCS.  Topical compounded agent with topical gabapentin and Diclofenac may be beneficial.    Meds ordered this encounter  Medications  . DISCONTD: meloxicam (MOBIC) 15 MG tablet    Sig: Take 1 tablet (15 mg total) by mouth daily.    Dispense:  30 tablet    Refill:  11  . meloxicam (MOBIC) 15 MG tablet    Sig: Take 1 tablet (15 mg total) by mouth daily.    Dispense:  30 tablet    Refill:  11    No Follow-up on file.    Reginia Forts, M.D.  [facility] [address] [place], [place]  [address] [phone] phone [phone] fax

## 2014-05-20 LAB — URINE CULTURE: Colony Count: 25000

## 2014-05-23 DIAGNOSIS — M8580 Other specified disorders of bone density and structure, unspecified site: Secondary | ICD-10-CM | POA: Insufficient documentation

## 2014-05-23 DIAGNOSIS — M150 Primary generalized (osteo)arthritis: Secondary | ICD-10-CM | POA: Insufficient documentation

## 2014-05-23 DIAGNOSIS — M159 Polyosteoarthritis, unspecified: Secondary | ICD-10-CM | POA: Insufficient documentation

## 2014-05-26 MED ORDER — AMOXICILLIN 500 MG PO CAPS: 500.0000 mg | ORAL_CAPSULE | Freq: Two times a day (BID) | ORAL | Status: DC

## 2014-05-26 NOTE — Addendum Note (Signed)
Addended by: Wardell Honour on: 05/26/2014 01:38 PM   Modules accepted: Orders

## 2014-05-27 ENCOUNTER — Encounter: Payer: Self-pay | Admitting: Sports Medicine

## 2014-05-27 ENCOUNTER — Ambulatory Visit (INDEPENDENT_AMBULATORY_CARE_PROVIDER_SITE_OTHER): Payer: Medicare Other | Admitting: Sports Medicine

## 2014-05-27 VITALS — BP 101/68 | HR 92 | Ht 65.0 in | Wt 133.0 lb

## 2014-05-27 DIAGNOSIS — M7711 Lateral epicondylitis, right elbow: Secondary | ICD-10-CM

## 2014-05-27 DIAGNOSIS — M779 Enthesopathy, unspecified: Secondary | ICD-10-CM

## 2014-05-27 DIAGNOSIS — M7750 Other enthesopathy of unspecified foot: Secondary | ICD-10-CM

## 2014-05-27 NOTE — Progress Notes (Signed)
   Subjective:    Patient ID: Kristin Tapia, female    DOB: May 29, 1946, 68 y.o.   MRN: [id number]  HPI Kristin Tapia is a 68yo right-hand dominant female who presents for follow-up of right lateral epicondylitis and with bilateral feet pain. She was on a trip and had a temporarily interruption to her elbow exercises and lost her elbow strap temporarily. This aggravated her symptoms. Location of pain is over the common extensor tendons and around the lateral epicondyle.  She complains of pain located around her bilateral 2nd metatarsals. No associated swelling or induration. Aggravated with walking. She wears custom orthotics. An older pair has metatarsal pads, but she finds these aggravate her pain, so she had removed them. Character is a throbbing and burning pain.  Past medical history, social history, medications, and allergies were reviewed and are up to date in the chart.  Review of Systems 7 point review of systems was performed and was otherwise negative unless noted in the history of present illness.     Objective:   Physical Exam BP 101/68 mmHg  Pulse 92  Ht 5\' 5"  (1.651 m)  Wt 133 lb (60.328 kg)  BMI 22.13 kg/m2 GEN: The patient is well-developed well-nourished female and in no acute distress.  She is awake alert and oriented x3. SKIN: warm and well-perfused, no rash  EXTR: No lower extremity edema or calf tenderness Neuro: Strength 5/5 globally. Sensation intact throughout. No focal deficits. Vasc: +2 bilateral distal pulses. No edema.  MSK: Examination of the right elbow reveals full ROM. TTP over the common extensors on the left and at the lateral epicondyle. Pain reproduced with resisted supination and wrist extension. Examination of the bilateral feet reveals collapse of the lateral transverse arches. No swelling or induration.  Limited US: Views of the right lateral epicondyle reveal a calcification with surrounding hypoechoic fluid, without significant neovascular  changes. The common extensor tendon appears otherwise normal. Views of the 2nd MTP joint on the right reveal normal joint fluid and tendon structure.    Assessment & Plan:  Please see problem based assessment and plan in the problem list.

## 2014-05-27 NOTE — Assessment & Plan Note (Signed)
-  Modified custom orthotics with a small neuroma pad in place of metatarsal pad on the right with relief of symptoms. -Tried same correction on the left, but she did not tolerate this due to pain despite repositioning the pad -Instead, added a blue foam padding on the left over the medial metatarsals. -She will follow-up in about 8 weeks to see how this has worked or sooner if needed

## 2014-05-27 NOTE — Assessment & Plan Note (Signed)
-  Calcification at right lateral epicondyle with surrounding fluid on [place] today -Fitted and applied body helix compression sleeve today with improvement -Continue exercises -Try topical arnica the the area -Follow-up in 2 months or sooner if needed

## 2014-07-03 ENCOUNTER — Ambulatory Visit
Admission: RE | Admit: 2014-07-03 | Discharge: 2014-07-03 | Disposition: A | Discharge status: Home / Self Care | Payer: PRIVATE HEALTH INSURANCE | Source: Ambulatory Visit | Attending: Family Medicine | Admitting: Family Medicine

## 2014-07-03 DIAGNOSIS — K863 Pseudocyst of pancreas: Secondary | ICD-10-CM

## 2014-07-03 MED ORDER — GADOBENATE DIMEGLUMINE 529 MG/ML IV SOLN
12.0000 mL | Freq: Once | INTRAVENOUS | Status: AC | PRN
  Administered 2014-07-03: 12 mL via INTRAVENOUS

## 2014-07-28 ENCOUNTER — Ambulatory Visit (INDEPENDENT_AMBULATORY_CARE_PROVIDER_SITE_OTHER): Payer: Medicare Other | Admitting: Sports Medicine

## 2014-07-28 ENCOUNTER — Encounter: Payer: Self-pay | Admitting: Sports Medicine

## 2014-07-28 VITALS — BP 97/63

## 2014-07-28 DIAGNOSIS — M25572 Pain in left ankle and joints of left foot: Secondary | ICD-10-CM

## 2014-07-28 DIAGNOSIS — R269 Unspecified abnormalities of gait and mobility: Secondary | ICD-10-CM

## 2014-07-28 DIAGNOSIS — M7711 Lateral epicondylitis, right elbow: Secondary | ICD-10-CM

## 2014-07-28 DIAGNOSIS — M5432 Sciatica, left side: Secondary | ICD-10-CM

## 2014-07-28 MED ORDER — AMITRIPTYLINE HCL 10 MG PO TABS: 10.0000 mg | ORAL_TABLET | Freq: Every day | ORAL | Status: DC

## 2014-07-28 NOTE — Patient Instructions (Addendum)
Elbow pain - Lateral epicondylitis - improving  Increase weight to 2.5 lbs and increase reps by 5 each week, continue body-helix sleeve  Right foot - Neuroma pain   Left sided sciatica  - start low dose amitriptyline 10 mg at bedtime and increase to 2 tabs (20mg ) if improving symptoms without side effects  Amitriptyline tablets What is this medicine? AMITRIPTYLINE (a mee TRIP ti leen) is used to treat depression. This medicine may be used for other purposes; ask your health care provider or pharmacist if you have questions. COMMON BRAND NAME(S): Elavil, Vanatrip What should I tell my health care provider before I take this medicine? They need to know if you have any of these conditions: -an alcohol problem -asthma, difficulty breathing -bipolar disorder or schizophrenia -difficulty passing urine, prostate trouble -glaucoma -heart disease or previous heart attack -liver disease -over active thyroid -seizures -thoughts or plans of suicide, a previous suicide attempt, or family history of suicide attempt -an unusual or allergic reaction to amitriptyline, other medicines, foods, dyes, or preservatives -pregnant or trying to get pregnant -breast-feeding How should I use this medicine? Take this medicine by mouth with a drink of water. Follow the directions on the prescription label. You can take the tablets with or without food. Take your medicine at regular intervals. Do not take it more often than directed. Do not stop taking this medicine suddenly except upon the advice of your doctor. Stopping this medicine too quickly may cause serious side effects or your condition may worsen. A special MedGuide will be given to you by the pharmacist with each prescription and refill. Be sure to read this information carefully each time. Talk to your pediatrician regarding the use of this medicine in children. Special care may be needed. Overdosage: If you think you have taken too much of this  medicine contact a poison control center or emergency room at once. NOTE: This medicine is only for you. Do not share this medicine with others. What if I miss a dose? If you miss a dose, take it as soon as you can. If it is almost time for your next dose, take only that dose. Do not take double or extra doses. What may interact with this medicine? Do not take this medicine with any of the following medications: -arsenic trioxide -certain medicines used to regulate abnormal heartbeat or to treat other heart conditions -cisapride -droperidol -halofantrine -linezolid -MAOIs like Carbex, Eldepryl, Marplan, Nardil, and Parnate -methylene blue -other medicines for mental depression -phenothiazines like perphenazine, thioridazine and chlorpromazine -pimozide -probucol -procarbazine -sparfloxacin -St. John's Wort -ziprasidone This medicine may also interact with the following medications: -atropine and related drugs like hyoscyamine, scopolamine, tolterodine and others -barbiturate medicines for inducing sleep or treating seizures, like phenobarbital -cimetidine -disulfiram -ethchlorvynol -thyroid hormones such as levothyroxine This list may not describe all possible interactions. Give your health care provider a list of all the medicines, herbs, non-prescription drugs, or dietary supplements you use. Also tell them if you smoke, drink alcohol, or use illegal drugs. Some items may interact with your medicine. What should I watch for while using this medicine? Tell your doctor if your symptoms do not get better or if they get worse. Visit your doctor or health care professional for regular checks on your progress. Because it may take several weeks to see the full effects of this medicine, it is important to continue your treatment as prescribed by your doctor. Patients and their families should watch out for new or worsening thoughts  of suicide or depression. Also watch out for sudden changes  in feelings such as feeling anxious, agitated, panicky, irritable, hostile, aggressive, impulsive, severely restless, overly excited and hyperactive, or not being able to sleep. If this happens, especially at the beginning of treatment or after a change in dose, call your health care professional. Dennis Bast may get drowsy or dizzy. Do not drive, use machinery, or do anything that needs mental alertness until you know how this medicine affects you. Do not stand or sit up quickly, especially if you are an older patient. This reduces the risk of dizzy or fainting spells. Alcohol may interfere with the effect of this medicine. Avoid alcoholic drinks. Do not treat yourself for coughs, colds, or allergies without asking your doctor or health care professional for advice. Some ingredients can increase possible side effects. Your mouth may get dry. Chewing sugarless gum or sucking hard candy, and drinking plenty of water will help. Contact your doctor if the problem does not go away or is severe. This medicine may cause dry eyes and blurred vision. If you wear contact lenses you may feel some discomfort. Lubricating drops may help. See your eye doctor if the problem does not go away or is severe. This medicine can cause constipation. Try to have a bowel movement at least every 2 to 3 days. If you do not have a bowel movement for 3 days, call your doctor or health care professional. This medicine can make you more sensitive to the sun. Keep out of the sun. If you cannot avoid being in the sun, wear protective clothing and use sunscreen. Do not use sun lamps or tanning beds/booths. What side effects may I notice from receiving this medicine? Side effects that you should report to your doctor or health care professional as soon as possible: -allergic reactions like skin rash, itching or hives, swelling of the face, lips, or tongue -abnormal production of milk in females -breast enlargement in both males and  females -breathing problems -confusion, hallucinations -fast, irregular heartbeat -fever with increased sweating -muscle stiffness, or spasms -pain or difficulty passing urine, loss of bladder control -seizures -suicidal thoughts or other mood changes -swelling of the testicles -tingling, pain, or numbness in the feet or hands -yellowing of the eyes or skin Side effects that usually do not require medical attention (report to your doctor or health care professional if they continue or are bothersome): -change in sex drive or performance -constipation or diarrhea -nausea, vomiting -weight gain or loss This list may not describe all possible side effects. Call your doctor for medical advice about side effects. You may report side effects to FDA at 1-800-FDA-1088. Where should I keep my medicine? Keep out of the reach of children. Store at room temperature between 20 and 25 degrees C (68 and 77 degrees F). Throw away any unused medicine after the expiration date. NOTE: This sheet is a summary. It may not cover all possible information. If you have questions about this medicine, talk to your doctor, pharmacist, or health care provider.  2015, [employer] Standard. (2011-10-30 13:50:32)

## 2014-07-28 NOTE — Progress Notes (Signed)
Kristin Tapia - 69 y.o. female MRN [id number]  Date of birth: Jan 20, 1946  SUBJECTIVE:  Including CC & ROS.   Kristin Tapia is a 69 year old female falling up today with several issues including right elbow lateral epicondylitis, bilateral ankle degenerative osteoarthritis, and a flareup of lumbar back pain with radicular symptoms.  Patient is right-hand dominant -Patient treated her right elbow pain with home exercise program which has improved her pain and functionality. She also used intermittent Arnica gel. She feels her pain is improved but she still lacks strength. Currently only doing 10 reps with 1 pound weight. No radicular symptoms of numbness tingling in the hand, no loss of grip strength.  Left ankle pain needs to be intermittent stiffness pain is worse with walking. Continues wear compression sleeve and doing home stretching exercises.  Right foot pain particularly between the base of the second metatarsal head and the first metatarsal head. Patient was treated with a neuroma pad previously but has not provided her significant relief. Continue to wear custom orthotics. Describes some numbness in the second and third toe. Not causing any toe weakness or stiffness.  Left-sided lumbar back pain with radiculopathy down the posterior leg. Patient also describes numbness and tingling in the toes. Has known L5-S1 disc degenerative changes. Pain is worse at night. Has been taking Mobic for pain control which has provided her some pain relief.   ROS: Review of systems otherwise negative except for information present in HPI  HISTORY: Past Medical, Surgical, Social, and Family History Reviewed & Updated per EMR. Pertinent Historical Findings include: Lumbar degenerative disc disease Diabetes and hyperlipidemia Nonsmoker  DATA REVIEWED: No previous lumbar x-rays palpable for review  PHYSICAL EXAM:  VS: BP:97/63 mmHg  HR: bpm  TEMP: ( )  RESP:   HT:    WT:   BMI:  RIGHT ELBOW  EXAM: General: well nourished Skin of UE: warm; dry, no rashes, lesions, ecchymosis or erythema. Vascular: radial pulses 2+ bilaterally Neurologically: Sensation to light touch upper extremities equal and intact bilaterally.     Observation: no elbow edema, no swelling, no erythema, no bruising  Palpation: mild TTP over the lateral epicondyle and common extensor insertion no tenderness radial head, no medial epicondyle tenderness    ROM: normal supination and pronation, elbow extension, full flexion    Strength: Normal 5/5 strength with extension/ flexion, pronation and supination.           Special test: stable medial and lateral collateral ligaments, no tenderness with finger extension   LOW BACK EXAM: Skin of LE: warm; dry, no rashes, lesions, ecchymosis or erythema. Vascular: radial pulses 2+ bilaterally Neurologically: Sensation to light touch lower extremities equal and intact bilaterally.  Palpation:  No step off defects noted in the thoracic or lumbar spine.   No muscle spasm or tenderness along the paraspinal musculature of the thoracic or lumbar spine. Range of motion:  Normal flexion on forward bending, mild pain with extension + stork test on left,  Neuromuscular: No pain with straight leg raise left or right, normal gait walking  Nerve root intervention  L2 and L3: Normal hip flexion with no weakness. L2, L3, L4: Normal hip abduction bilaterally.   L4, L5, S1: Normal hip adduction bilaterally S1 and S2: Normal ankle plantar-flexion bilaterally.   L5: Normal extensor hallucis longus bilaterally  FEET EXAM:  Observation - no ecchymosis, no edema, or hematoma present  Palpation: TTP over the base of the second metatarsal head with a cystic nodule palpable  Normal ankle motion and strength bilaterally  Extension/flexion 5/5 strength bilaterally in toes  ASSESSMENT & PLAN: See problem based charting & AVS for pt instructions.

## 2014-07-29 NOTE — Assessment & Plan Note (Signed)
Patient continues to have some pain in her right foot consistent with a cystic nodule at the joint space versus possible neuroma. At this point in time we recommended additional padding to her custom orthotics. Given that the neuroma pad did not provide much relief patient was fitted with a U-shaped pad to take pressure off this nodule.  Patient will follow-up in 4-6 weeks to reevaluate the changes that were made for her today.

## 2014-07-29 NOTE — Assessment & Plan Note (Signed)
Patient presented today with a flareup of sciatica with radiculopathy into her left leg. She has known L5-S1 disc bulge and degenerative changes throughout her spine that is contributing to her symptoms. At this point time we recommended starting a low dose amitriptyline for this neuropathic type pain. Patient was agreeable as planned sent in a prescription for 10 mg amitriptyline at bedtime with plans to titrate up to 20 mg over the next 2 weeks if well tolerated.

## 2014-07-29 NOTE — Assessment & Plan Note (Signed)
Patient is clinically improving from her lateral epicondylitis with less pain. Continues to have some weakness compared to the left side. Recommending a increasing home exercise program to include a 2.5 pound weight and increasing reps by 5 over the next several weeks to a maximum of the lease 35 reps.

## 2014-09-11 ENCOUNTER — Other Ambulatory Visit: Payer: Self-pay | Admitting: Gastroenterology

## 2014-09-23 ENCOUNTER — Other Ambulatory Visit: Payer: Self-pay

## 2014-09-23 NOTE — Telephone Encounter (Signed)
[facility] faxed req to RF estradiol cream 0.2mg /ml. Premarin cream is what is most recently listed in pt's chart. Dr Tamala Julian, is the Premarin what you want to order? I have pended it the way you wrote it last.

## 2014-09-24 MED ORDER — ESTROGENS, CONJUGATED 0.625 MG/GM VA CREA: TOPICAL_CREAM | Freq: Every day | VAGINAL | Status: DC

## 2014-12-10 ENCOUNTER — Ambulatory Visit (INDEPENDENT_AMBULATORY_CARE_PROVIDER_SITE_OTHER): Payer: Medicare Other | Admitting: Sports Medicine

## 2014-12-10 ENCOUNTER — Encounter: Payer: Self-pay | Admitting: Sports Medicine

## 2014-12-10 VITALS — BP 110/65 | HR 75 | Ht 65.0 in | Wt 132.0 lb

## 2014-12-10 DIAGNOSIS — M67911 Unspecified disorder of synovium and tendon, right shoulder: Secondary | ICD-10-CM | POA: Diagnosis not present

## 2014-12-10 DIAGNOSIS — M5432 Sciatica, left side: Secondary | ICD-10-CM | POA: Diagnosis not present

## 2014-12-10 MED ORDER — GABAPENTIN 300 MG PO CAPS: 300.0000 mg | ORAL_CAPSULE | Freq: Every day | ORAL | Status: DC

## 2014-12-10 NOTE — Assessment & Plan Note (Signed)
We will give her a probably of 300 mg of Neurontin at nighttime to see if this helps her sciatica

## 2014-12-10 NOTE — Assessment & Plan Note (Signed)
We will treat this with a home exercise program  The small tear does not look significant  She does have some spurring so it's unlikely that all her pain will resolve  Nitroglycerin gave her a severe headache in the past so we did not use this  Recheck in 6 weeks

## 2014-12-10 NOTE — Progress Notes (Signed)
Patient ID: Kristin Tapia, female   DOB: [birth year], 69 y.o.   MRN: [id number]  2 months ago the patient had an injury causing pain RT shoulder and neck on ER in water classes gradually started feeling at night if rolled onto RT Shoulder  She has not noted much weakness She does have trouble putting on a shirt or reaching behind her  Stays off left with pain over GT and this may worsen her sciatica Her sciatica on the left is pretty much unchanged but is improved with activity Sitting makes it worse She tried amitriptyline but did not get much help Exercises and stretches to help somewhat  Physical examination No acute distress BP 110/65 mmHg  Pulse 75  Ht 5\' 5"  (1.651 m)  Wt 132 lb (59.875 kg)  BMI 21.97 kg/m2  Shoulder: Inspection reveals slt atrophy SUPSP fossa on RT. Palpation is normal with no tenderness over AC joint or bicipital groove. ROM is full in all planes. Rotator cuff strength normal throughout. Some + impingement withHawkin's tests and empty can. Speeds and Yergason's tests normal. No labral pathology noted with negative Obrien's, negative clunk and good stability.  Mild protraction or RT scapulae observed. No painful arc and no drop arm sign. No apprehension sign  Ultrasound of right shoulder There is a fluid pocket noted at the biceps tendon sheath over the pectoralis tendon inserts Supraspinatous tendon shows a small spur There is about a half centimeter tear in the distal tendon Infraspinatus and teres minor tendons are normal Subscapularis tendon is normal Mild calcification noted in the articular cartilage posteriorly

## 2015-01-12 ENCOUNTER — Ambulatory Visit (INDEPENDENT_AMBULATORY_CARE_PROVIDER_SITE_OTHER): Payer: Medicare Other | Admitting: Sports Medicine

## 2015-01-12 ENCOUNTER — Encounter: Payer: Self-pay | Admitting: Sports Medicine

## 2015-01-12 VITALS — BP 106/75 | HR 67 | Ht 65.0 in | Wt 132.0 lb

## 2015-01-12 DIAGNOSIS — M5432 Sciatica, left side: Secondary | ICD-10-CM

## 2015-01-12 DIAGNOSIS — M67911 Unspecified disorder of synovium and tendon, right shoulder: Secondary | ICD-10-CM | POA: Diagnosis not present

## 2015-01-12 DIAGNOSIS — S76311A Strain of muscle, fascia and tendon of the posterior muscle group at thigh level, right thigh, initial encounter: Secondary | ICD-10-CM

## 2015-01-12 NOTE — Assessment & Plan Note (Signed)
Unusual side effect from gabapentin (diarrhea). Repeat trial to see if side effect was definitely from the gabapentin, if it is asked patient to call the clinic and we will send in a trial of low dose lyrica. F/u 2 months.

## 2015-01-12 NOTE — Assessment & Plan Note (Signed)
Better motion today  Keep up home exercise until next visit  Reassess at that visit/  Repeat US if not resolved

## 2015-01-12 NOTE — Patient Instructions (Signed)
Hamstring: 4 times a week  Extensor exercise: lay on back, lock hands behind the right thigh and extend the lower leg 4 sets of 8  Diver: stand on right leg with leg straight as possible, lean forward with left leg extended and arms out in front of you 3 sets of 6   Reverse then forward lunge (standing on left leg) 3 sets of 15  Try the gabapentin again. If it causes diarrhea again call us back and we will send in a prescription for Lyrica (pregabalin) at a low dose, if it is sent in take this one at night as well  Try taking vitamin B6 again (50-100mg  a day)

## 2015-01-12 NOTE — Assessment & Plan Note (Signed)
Strain after slip/fall. She overall has excellent ROM and strength of her hip and hamstrings. HEP as directed in AVS. Suspect she will do well with this and not have much issue after ~6 weeks. F/u 8 weeks with sciatica f/u.

## 2015-01-12 NOTE — Progress Notes (Signed)
  Kristin Tapia - 69 y.o. female MRN [id number]  Date of birth: [birth year]  SUBJECTIVE:  Including CC & ROS.   Patient presents for follow up of left sciatica pain; she also complains of right hamstring pain after a fall. For her sciatica she was started on gabapentin at last visit and when she took it she got excellent relief of her pain, however within 24 hours of starting she developed diarrhea. The diarrhea continued for 2 weeks while she was taking the gabapentin and she stopped this, and diarrhea resolved within 24 hours. For her right hamstring, she was walking and tried to catch up to someone and slipped forward on grass, falling all the way to the ground. She has been having pain early in the morning that improves during the day. She has been taking mobic daily along with 1 tylenol a day.    PHYSICAL EXAM:  VS: BP:106/75 mmHg  HR:67bpm  TEMP: ( )  RESP:   HT:5\' 5"  (165.1 cm)   WT:132 lb (59.875 kg)  BMI:22 PHYSICAL EXAM: General: NAD Neuro: alert and oriented x 3 MSK: Hips: excellent ROM of both hips and legs, right hip flexion and leg extension causes only slight pain at around 80 degrees of leg extension. She is TTP near right ischial tuberosity. Strength excellent in hip flexion, extension, abduction, adduction.   Note shoulder ROM on RT is more full and less painful  ASSESSMENT & PLAN: See problem based charting & AVS for pt instructions.

## 2015-01-25 ENCOUNTER — Other Ambulatory Visit: Payer: Self-pay | Admitting: *Deleted

## 2015-01-25 DIAGNOSIS — C4492 Squamous cell carcinoma of skin, unspecified: Secondary | ICD-10-CM

## 2015-01-25 HISTORY — DX: Squamous cell carcinoma of skin, unspecified: C44.92

## 2015-01-25 MED ORDER — GABAPENTIN 300 MG PO CAPS: 300.0000 mg | ORAL_CAPSULE | Freq: Every day | ORAL | Status: DC

## 2015-01-29 ENCOUNTER — Ambulatory Visit (INDEPENDENT_AMBULATORY_CARE_PROVIDER_SITE_OTHER): Payer: Medicare Other | Admitting: Physician Assistant

## 2015-01-29 VITALS — BP 104/66 | HR 78 | Temp 98.3°F | Resp 16 | Ht 65.0 in | Wt 136.4 lb

## 2015-01-29 DIAGNOSIS — D485 Neoplasm of uncertain behavior of skin: Secondary | ICD-10-CM | POA: Diagnosis not present

## 2015-01-29 DIAGNOSIS — M7592 Shoulder lesion, unspecified, left shoulder: Secondary | ICD-10-CM

## 2015-01-29 NOTE — Progress Notes (Signed)
Kristin Tapia  MRN: [id number] DOB: [birth year]  Subjective:  Pt presents to clinic with left shoulder lesion.  It has been present for about a month - it seems to be getting worse.  She has been using ETOH swabs but it has not been getting better.  She does not remember trauma to the area.  She would like it removed.  Patient Active Problem List   Diagnosis Date Noted  . Right hamstring muscle strain 01/12/2015  . Tendinopathy of right rotator cuff 12/10/2014  . Primary osteoarthritis involving multiple joints 05/23/2014  . Osteopenia 05/23/2014  . Right tennis elbow 03/05/2014  . Pain in joint, ankle and foot 05/06/2013  . Hip pain 01/17/2013  . Screening for lipoid disorders 05/31/2012  . Screening for diabetes mellitus 05/31/2012  . Routine general medical examination at a health care facility 05/31/2012  . Need for prophylactic vaccination and inoculation against influenza 05/31/2012  . SCIATICA, LEFT 01/17/2010  . Enthesopathy of ankle and tarsus 03/11/2009  . CAVUS DEFORMITY OF FOOT, ACQUIRED 03/11/2009  . ABNORMALITY OF GAIT 03/11/2009    Current Outpatient Prescriptions on File Prior to Visit  Medication Sig Dispense Refill  . Calcium Carbonate-Vitamin D (CALCIUM 600+D3) 600-400 MG-UNIT per tablet Take 1 tablet by mouth 3 (three) times a week.    . cholecalciferol (VITAMIN D) 400 UNITS TABS Take 400 Units by mouth 3 (three) times a week.    . conjugated estrogens (PREMARIN) vaginal cream Place vaginally daily. This is a compounded RX.  [phone]. 1 year supply.  0.02% 42.5 g 12  . cycloSPORINE (RESTASIS) 0.05 % ophthalmic emulsion Place 1 drop into both eyes 2 (two) times daily.    . fexofenadine (ALLEGRA) 180 MG tablet Take 180 mg by mouth every morning.    . gabapentin (NEURONTIN) 300 MG capsule Take 1 capsule (300 mg total) by mouth at bedtime. 30 capsule 1  . ipratropium (ATROVENT) 0.03 % nasal spray Place 2 sprays into the nose 3 (three) times daily.    .  Magnesium 250 MG TABS Take 1 tablet by mouth daily.    . meloxicam (MOBIC) 15 MG tablet Take 1 tablet (15 mg total) by mouth daily. 30 tablet 11  . Omega-3 Fatty Acids (FISH OIL) 1200 MG CAPS Take 1,000 mg by mouth daily. Patient takes 2 gel caps daily    . Turmeric 500 MG CAPS Take 500 mg by mouth 2 (two) times daily.    Marland Kitchen ALPRAZolam (XANAX) 1 MG tablet TAKE 0.5 TABLET AT BEDTIME AS NEEDED FOR SLEEP (Patient not taking: Reported on 01/29/2015) 30 tablet 0  . amitriptyline (ELAVIL) 10 MG tablet Take 1 tablet (10 mg total) by mouth at bedtime. Increase to 2 tabs in 2 week if tolerating (Patient not taking: Reported on 01/29/2015) 30 tablet 0  . cetirizine (ZYRTEC) 10 MG tablet Take 10 mg by mouth at bedtime.    . [employer] GENTLE LAXATIVE 5 MG EC tablet See admin instructions.  0  . fluticasone (FLONASE) 50 MCG/ACT nasal spray Place into both nostrils daily.    . nitroGLYCERIN (MINITRAN) 0.2 mg/hr patch Place 1/4 patch to the elbow daily and replace next day. (Patient not taking: Reported on 01/29/2015) 30 patch 12  . polyethylene glycol-electrolytes (NULYTELY/GOLYTELY) 420 G solution See admin instructions.  0   No current facility-administered medications on file prior to visit.    Allergies  Allergen Reactions  . Sulfonamide Derivatives     Review of Systems Objective:  BP  104/66 mmHg  Pulse 78  Temp(Src) 98.3 F (36.8 C) (Oral)  Resp 16  Ht 5\' 5"  (1.651 m)  Wt 136 lb 6.4 oz (61.871 kg)  BMI 22.70 kg/m2  SpO2 98%  Physical Exam  Constitutional: She is oriented to person, place, and time and well-developed, well-nourished, and in no distress.  HENT:  Head: Normocephalic and atraumatic.  Right Ear: Hearing and external ear normal.  Left Ear: Hearing and external ear normal.  Eyes: Conjunctivae are normal.  Neck: Normal range of motion.  Pulmonary/Chest: Effort normal.  Neurological: She is alert and oriented to person, place, and time. Gait normal.  Skin: Skin is warm and dry.  0.5 cm  raised rough papule without surrounding erythema or swelling  Psychiatric: Mood, memory, affect and judgment normal.  Vitals reviewed.  Procedure: Consent obtained. 2% lido with epi local anesthesia.  Betadine prep. Elliptical incision to remove the lesion. Undermined the incision edge and pulled together with 5-0 Ethilon #4 horizontal sutures.  Drsg place.  Assessment and Plan :  Lesion of shoulder, left - Plan: Dermatology pathology  Removed - wound care d/w pt.  SR in 7 days.  Windell Hummingbird PA-C  [facility] 01/29/2015 4:26 PM

## 2015-01-29 NOTE — Progress Notes (Signed)
   Subjective:    Patient ID: Kristin Tapia, female    DOB: 1945/08/07, 69 y.o.   MRN: [id number]  HPI   First noticed spot on left shoulder 4 weeks ago. When she first noticed it it was a raised, very tender bump. She cleaned well with alcohol and used a sterile needed to try to decompress. The area is rough and tender to touch. Pt denies any medication use other than alochol. She uses sunscreen. Nothing like this has happened before. Review of Systems  Constitutional: Negative for fever, chills, activity change, appetite change and unexpected weight change.  Respiratory: Negative.   Cardiovascular: Negative.   Gastrointestinal: Negative.   Skin: Negative.        Objective:   Physical Exam  Skin:          Assessment & Plan:  Lesion of shoulder, left - Plan: Dermatology pathology

## 2015-03-11 ENCOUNTER — Ambulatory Visit (INDEPENDENT_AMBULATORY_CARE_PROVIDER_SITE_OTHER): Payer: Medicare Other | Admitting: Physician Assistant

## 2015-03-11 VITALS — BP 108/66 | HR 82 | Temp 98.2°F | Resp 16 | Ht 65.0 in | Wt 134.4 lb

## 2015-03-11 DIAGNOSIS — B373 Candidiasis of vulva and vagina: Secondary | ICD-10-CM | POA: Diagnosis not present

## 2015-03-11 DIAGNOSIS — Z23 Encounter for immunization: Secondary | ICD-10-CM | POA: Diagnosis not present

## 2015-03-11 DIAGNOSIS — B3731 Acute candidiasis of vulva and vagina: Secondary | ICD-10-CM

## 2015-03-11 DIAGNOSIS — L298 Other pruritus: Secondary | ICD-10-CM

## 2015-03-11 DIAGNOSIS — R300 Dysuria: Secondary | ICD-10-CM | POA: Diagnosis not present

## 2015-03-11 DIAGNOSIS — N898 Other specified noninflammatory disorders of vagina: Secondary | ICD-10-CM

## 2015-03-11 LAB — POCT WET PREP WITH KOH
KOH Prep POC: POSITIVE
TRICHOMONAS UA: NEGATIVE

## 2015-03-11 LAB — POCT UA - MICROSCOPIC ONLY
Casts, Ur, LPF, POC: NEGATIVE
Crystals, Ur, HPF, POC: NEGATIVE
Yeast, UA: NEGATIVE

## 2015-03-11 LAB — POCT URINALYSIS DIPSTICK
BILIRUBIN UA: NEGATIVE
Glucose, UA: NEGATIVE
Ketones, UA: NEGATIVE
LEUKOCYTES UA: NEGATIVE
Nitrite, UA: NEGATIVE
Protein, UA: NEGATIVE
Spec Grav, UA: 1.015
Urobilinogen, UA: 0.2
pH, UA: 7

## 2015-03-11 MED ORDER — FLUCONAZOLE 150 MG PO TABS: 150.0000 mg | ORAL_TABLET | Freq: Once | ORAL | Status: DC

## 2015-03-11 NOTE — Patient Instructions (Signed)

## 2015-03-11 NOTE — Progress Notes (Signed)
Subjective:    Patient ID: Kristin Tapia, female    DOB: Jun 25, 1946, 69 y.o.   MRN: [id number]  HPI Patient presents for 3 weeks of vaginal irritation that is now accompanied by vaginal itching and dysuria. States that sx have increased and subsided several times in this 3 week period. Tried vagisil cream externally for a few days with only marginal relief then tried a 3 day course monistat with internal and external cream. Finished course 3 days ago, however, still having vaginal irritation and itching. Denies vaginal bleeding or discharge, pelvic pain, urinary frequency/urgency, fever, N/V. States that she went through menopause in her 108s. Sexually active with husband and has become uncomfortable.   Med allergy: Sulfa drugs.  Review of Systems  Constitutional: Negative.  Negative for fever and chills.  Gastrointestinal: Negative for nausea, vomiting, abdominal pain, diarrhea and constipation.  Genitourinary: Positive for dysuria and vaginal pain (discomfort). Negative for urgency, frequency, hematuria, flank pain, decreased urine volume, vaginal bleeding, vaginal discharge, difficulty urinating, pelvic pain and dyspareunia.       Objective:   Physical Exam  Constitutional: She is oriented to person, place, and time. She appears well-developed and well-nourished. No distress.  Blood pressure 108/66, pulse 82, temperature 98.2 F (36.8 C), temperature source Oral, resp. rate 16, height 5\' 5"  (1.651 m), weight 134 lb 6.4 oz (60.963 kg), SpO2 98 %.  HENT:  Head: Normocephalic and atraumatic.  Right Ear: External ear normal.  Left Ear: External ear normal.  Eyes: Conjunctivae are normal. Right eye exhibits no discharge. Left eye exhibits no discharge. No scleral icterus.  Cardiovascular: Normal rate, regular rhythm and normal heart sounds.  Exam reveals no gallop and no friction rub.   No murmur heard. Pulmonary/Chest: Effort normal and breath sounds normal. No respiratory distress. She  has no wheezes. She has no rales.  Abdominal: Soft. Normal appearance and bowel sounds are normal. She exhibits no distension. There is no tenderness. There is no rebound, no guarding and no CVA tenderness. No hernia.  Genitourinary: There is no rash, tenderness, lesion or injury on the right labia. There is no rash, tenderness, lesion or injury on the left labia. Cervix exhibits no motion tenderness, no discharge and no friability. No erythema, tenderness or bleeding in the vagina. No foreign body around the vagina. No signs of injury around the vagina. Vaginal discharge found.  Vaginal rugae absent.   Neurological: She is alert and oriented to person, place, and time.  Skin: She is not diaphoretic.  Psychiatric: She has a normal mood and affect. Her behavior is normal. Judgment and thought content normal.   Results for orders placed or performed in visit on 03/11/15  POCT UA - Microscopic Only  Result Value Ref Range   WBC, Ur, HPF, POC 0-4    RBC, urine, microscopic 1-6    Bacteria, U Microscopic trace    Mucus, UA trace    Epithelial cells, urine per micros 1-5    Crystals, Ur, HPF, POC neg    Casts, Ur, LPF, POC neg    Yeast, UA neg   POCT urinalysis dipstick  Result Value Ref Range   Color, UA yellow    Clarity, UA clear    Glucose, UA neg    Bilirubin, UA neg    Ketones, UA neg    Spec Grav, UA 1.015    Blood, UA mod    pH, UA 7.0    Protein, UA neg    Urobilinogen, UA 0.2  Nitrite, UA neg    Leukocytes, UA Negative Negative  POCT Wet Prep with KOH  Result Value Ref Range   Trichomonas, UA Negative    Clue Cells Wet Prep HPF POC Moderate    Epithelial Wet Prep HPF POC Many Few, Moderate, Many   Yeast Wet Prep HPF POC Postive    Bacteria Wet Prep HPF POC Moderate (A) None, Few   RBC Wet Prep HPF POC Moderate    WBC Wet Prep HPF POC Many    KOH Prep POC Positive        Assessment & Plan:  1. Yeast vaginitis 2. Vagina itching - POCT Wet Prep with KOH -  fluconazole (DIFLUCAN) 150 MG tablet; Take 1 tablet (150 mg total) by mouth once. Repeat if needed  Dispense: 2 tablet; Refill: 0  3. Dysuria - POCT UA - Microscopic Only - POCT urinalysis dipstick  4. Need for prophylactic vaccination and inoculation against influenza - Flu Vaccine QUAD 36+ mos IM   Armanda Forand PA-C  [facility] 03/11/2015 1:14 PM

## 2015-03-15 ENCOUNTER — Other Ambulatory Visit: Payer: Self-pay | Admitting: Family Medicine

## 2015-03-15 NOTE — Telephone Encounter (Signed)
Please call in refill for Alprazolam as approved.  Thanks!

## 2015-03-15 NOTE — Telephone Encounter (Signed)
Pt has appt sch for 05/07/15.

## 2015-03-16 NOTE — Telephone Encounter (Signed)
Called in.

## 2015-03-17 ENCOUNTER — Telehealth: Payer: Self-pay | Admitting: Physician Assistant

## 2015-03-17 ENCOUNTER — Encounter: Payer: Self-pay | Admitting: Physician Assistant

## 2015-03-17 DIAGNOSIS — N760 Acute vaginitis: Principal | ICD-10-CM

## 2015-03-17 DIAGNOSIS — B9689 Other specified bacterial agents as the cause of diseases classified elsewhere: Secondary | ICD-10-CM

## 2015-03-17 MED ORDER — METRONIDAZOLE 1 % EX GEL: Freq: Every day | CUTANEOUS | Status: DC

## 2015-03-17 NOTE — Telephone Encounter (Signed)
Spoke with patient and sx may be worse. Completed diflucan. Did have some clue cells on wet prep. Would prefer vaginal route versus oral. Sent to pharmacy.

## 2015-03-17 NOTE — Telephone Encounter (Signed)
Spoke with patient and sent RX.

## 2015-03-19 ENCOUNTER — Telehealth: Payer: Self-pay

## 2015-03-19 MED ORDER — FLUCONAZOLE 150 MG PO TABS: 150.0000 mg | ORAL_TABLET | Freq: Once | ORAL | Status: DC

## 2015-03-19 MED ORDER — METRONIDAZOLE 500 MG PO TABS: 500.0000 mg | ORAL_TABLET | Freq: Two times a day (BID) | ORAL | Status: AC

## 2015-03-19 NOTE — Telephone Encounter (Signed)
Daughter notified 

## 2015-03-19 NOTE — Telephone Encounter (Signed)
Done - she should stop the vaginal gel.

## 2015-03-19 NOTE — Telephone Encounter (Signed)
PT'S DAUGHTER WANTS TO SEE IF WE CAN WRITE FOR ORAL METRANDIZOLE FOLLOWED BY A DOSE OF DIFLUCAN. SHE STATES PT IS NO BETTER AND IS HAVING PAIN WHEN SHE WIPES AFTER URINATION. PLEASE ADVISE.

## 2015-03-22 ENCOUNTER — Ambulatory Visit (INDEPENDENT_AMBULATORY_CARE_PROVIDER_SITE_OTHER): Payer: Medicare Other | Admitting: Physician Assistant

## 2015-03-22 ENCOUNTER — Encounter: Payer: Self-pay | Admitting: Physician Assistant

## 2015-03-22 VITALS — BP 124/80 | HR 75 | Temp 98.3°F | Resp 16 | Ht 64.0 in | Wt 134.0 lb

## 2015-03-22 DIAGNOSIS — B9689 Other specified bacterial agents as the cause of diseases classified elsewhere: Secondary | ICD-10-CM

## 2015-03-22 DIAGNOSIS — A499 Bacterial infection, unspecified: Secondary | ICD-10-CM

## 2015-03-22 DIAGNOSIS — N760 Acute vaginitis: Secondary | ICD-10-CM | POA: Diagnosis not present

## 2015-03-22 DIAGNOSIS — N949 Unspecified condition associated with female genital organs and menstrual cycle: Secondary | ICD-10-CM

## 2015-03-22 LAB — POCT WET + KOH PREP
Trich by wet prep: ABSENT
YEAST BY KOH: ABSENT
YEAST BY WET PREP: ABSENT

## 2015-03-22 NOTE — Progress Notes (Signed)
   Subjective:    Patient ID: Kristin Tapia, female    DOB: [birth year], 69 y.o.   MRN: [id number]  HPI Patient presents for continued vaginal discomfort that has now been present for over 4 weeks. Was seen 10 days ago and treated from yeast and BV. Currently taking metronidazole po and d/c intravaginally. Vaginally discomfort daily along with labial burning with urination. Denies vaginal bleeding/discharge, other urinary/bowel sx, fever, pelvic pain, N/V. Has abstained from intercourse during treatments.   Med allergy: Sulfa drugs.  Review of Systems As noted above.    Objective:   Physical Exam  Constitutional: She is oriented to person, place, and time. She appears well-developed and well-nourished. No distress.  Blood pressure 124/80, pulse 75, temperature 98.3 F (36.8 C), temperature source Oral, resp. rate 16, height 5\' 4"  (1.626 m), weight 134 lb (60.782 kg), SpO2 98 %.   HENT:  Head: Normocephalic and atraumatic.  Right Ear: External ear normal.  Left Ear: External ear normal.  Eyes: Conjunctivae are normal. Right eye exhibits no discharge. Left eye exhibits no discharge. No scleral icterus.  Cardiovascular: Normal rate, regular rhythm and normal heart sounds.  Exam reveals no gallop and no friction rub.   No murmur heard. Pulmonary/Chest: Effort normal and breath sounds normal. No respiratory distress. She has no wheezes. She has no rales.  Abdominal: Soft. Bowel sounds are normal. She exhibits no distension. There is no tenderness. There is no rebound, no guarding and no CVA tenderness. No hernia.  Genitourinary: Vagina normal. No labial fusion. There is no rash, tenderness, lesion or injury on the right labia. There is injury (small tear) on the left labia. There is no rash, tenderness or lesion on the left labia. Cervix exhibits no motion tenderness, no discharge and no friability. No erythema, tenderness or bleeding in the vagina. No foreign body around the vagina. No signs of  injury around the vagina. No vaginal discharge found.  Neurological: She is alert and oriented to person, place, and time.  Skin: She is not diaphoretic.  Psychiatric: She has a normal mood and affect. Her behavior is normal. Judgment and thought content normal.    Results for orders placed or performed in visit on 03/22/15  POCT Wet + KOH Prep ([facility])  Result Value Ref Range   Yeast by KOH Absent Present, Absent   Yeast by wet prep Absent Present, Absent   WBC by wet prep Few None, Few   Clue Cells Wet Prep HPF POC Few (A) None   Trich by wet prep Absent Present, Absent   Bacteria Wet Prep HPF POC Moderate (A) None, Few   Epithelial Cells By [employer] Pref ([facility]) Moderate (A) None, Few   RBC,UR,HPF,POC Few (A) None RBC/hpf      Assessment & Plan:  1. Bacterial vaginosis 2. Vaginal discomfort Continue metronidazole po until course complete. If sx continue following therapy should RTC. Has labial tear that will heal with time. Can continue intravaginal estrogen.  - POCT Wet + KOH Prep ([facility])   Alveta Heimlich PA-C  [facility] 03/25/2015 11:24 AM

## 2015-04-01 ENCOUNTER — Ambulatory Visit (INDEPENDENT_AMBULATORY_CARE_PROVIDER_SITE_OTHER): Payer: Medicare Other | Admitting: Sports Medicine

## 2015-04-01 ENCOUNTER — Encounter: Payer: Self-pay | Admitting: Sports Medicine

## 2015-04-01 VITALS — BP 126/70 | Ht 64.0 in | Wt 128.0 lb

## 2015-04-01 DIAGNOSIS — S76311D Strain of muscle, fascia and tendon of the posterior muscle group at thigh level, right thigh, subsequent encounter: Secondary | ICD-10-CM | POA: Diagnosis not present

## 2015-04-01 DIAGNOSIS — M67911 Unspecified disorder of synovium and tendon, right shoulder: Secondary | ICD-10-CM

## 2015-04-01 DIAGNOSIS — M216X9 Other acquired deformities of unspecified foot: Secondary | ICD-10-CM

## 2015-04-01 DIAGNOSIS — M5431 Sciatica, right side: Secondary | ICD-10-CM

## 2015-04-01 NOTE — Assessment & Plan Note (Signed)
Cont use of orthotics  We added some additional first ray support to see if that lessens new pain along first ray

## 2015-04-01 NOTE — Assessment & Plan Note (Signed)
Cont to improve  Walks for exercise

## 2015-04-01 NOTE — Assessment & Plan Note (Signed)
This is pain free now  Keep up some exercise 2x week

## 2015-04-01 NOTE — Assessment & Plan Note (Signed)
This is stable  Use Mobic as needed  Cont on HEP

## 2015-04-01 NOTE — Progress Notes (Signed)
  KASHAY CAVENAUGH - 69 y.o. female MRN [id number]  Date of birth: [birth year] KELLA SPLINTER is a 69 y.o. female who presents today for left foot pain and follow up of chronic problems Left foot seems to hurt along medial aspect of first ray Has stopped using ankle compression Orthotics for walking have been helpful Gets a lot of dorsiflexion of great toe with heel strike No known injury  RT rotator cuff Faithfully did HEP Now RT shoulder is pain free Motion feels full  RT sciatica This is much better ablet to stop gabapentin Mobic helps with flares  LT HS continuse some exercises on regular basis Feels that strength has returned  ROS No joint swelling/ no rashes/ no numbness/ no weakness  Exam:  Filed Vitals:   04/01/15 1049  BP: 126/70   Gen: NAD, thin female Cardiorespiratory - Normal respiratory effort/rate.  RRR  SLR is neg Norm LB motion Neg neuro exam  RT shoulder Inspection reveals no abnormalities, atrophy or asymmetry. Palpation is normal with no tenderness over AC joint or bicipital groove. ROM is full in all planes. Rotator cuff strength normal throughout. No signs of impingement with negative Neer and Hawkin's tests, empty can. Speeds and Yergason's tests normal. No labral pathology noted with negative Obrien's, negative clunk and good stability. Normal scapular function observed. No painful arc and no drop arm sign. No apprehension sign   LT HS - strong with testing in 3 positions No difference in strength L v R  Foot No palpable tenderness Nl ROM of toes with increased extension of GRT toe Gait is normal but gets dorsiflexion of GRT toe with heel strike

## 2015-04-12 LAB — HM MAMMOGRAPHY

## 2015-04-28 ENCOUNTER — Telehealth: Payer: Self-pay | Admitting: Physician Assistant

## 2015-04-28 NOTE — Telephone Encounter (Signed)
Spoke with patient regarding mammo and breast US. States tech explained that f/u US needed in 6 months to confirm benign origin of "thing" seen. If still present duct may need to be cleaned out. Daughter BRCA- and her mother dx at 78. 

## 2015-05-05 ENCOUNTER — Encounter: Payer: Self-pay | Admitting: Sports Medicine

## 2015-05-05 ENCOUNTER — Ambulatory Visit (INDEPENDENT_AMBULATORY_CARE_PROVIDER_SITE_OTHER): Payer: Medicare Other | Admitting: Sports Medicine

## 2015-05-05 VITALS — BP 128/82 | Ht 64.0 in | Wt 128.0 lb

## 2015-05-05 DIAGNOSIS — M216X9 Other acquired deformities of unspecified foot: Secondary | ICD-10-CM

## 2015-05-05 DIAGNOSIS — M25572 Pain in left ankle and joints of left foot: Secondary | ICD-10-CM

## 2015-05-05 NOTE — Assessment & Plan Note (Signed)
She is probably starting to get some tarsal metatarsal joint arthritis Continue using her Mobic We will use an arch strap or a full ankle support which ever feels better I added additional arch support to her orthotics  This feels more comfortable and she will monitor how this does over the next 6 weeks

## 2015-05-05 NOTE — Assessment & Plan Note (Signed)
We will keep her in custom orthotics  We may need to add a bit more arch support to the orthotics  Today because of the joint pain I added a scaphoid pad on the left

## 2015-05-05 NOTE — Progress Notes (Signed)
Patient ID: ROSAMUND NYLAND, female   DOB: [birth year], 69 y.o.   MRN: [id number]  Cavus foot changes We have used orthotics for several years These have helped   Now 4.5 mos of arch pain on left Directly over the TMT1 joint  Walks a lot but no acute injury   ROS Swelling at times No redness Night pain No numbness  Exam NAD BP 128/82 mmHg  Ht 5\' 4"  (1.626 m)  Wt 128 lb (58.06 kg)  BMI 21.96 kg/m2   Ankle: No visible erythema or swelling. Range of motion is full in all directions. Strength is 5/5 in all directions. Stable lateral and medial ligaments; squeeze test and kleiger test unremarkable; Talar dome nontender; No pain at base of 5th MT; No tenderness over cuboid; No tenderness over N spot or navicular prominence No tenderness on posterior aspects of lateral and medial malleolus No sign of peroneal tendon subluxations; Negative tarsal tunnel tinel's Able to walk 4 steps.  Tenderness along the dorsal and plantar surface of the first TMT joint No tenderness at the intertarsal joints No swelling Cavus foot type bilaterally  Ultrasound There is mild spurring at the first TMT joint left There is slight swelling There is some spurring at the tarsal tarsal joints Plantar surface of the joint shows a calcification

## 2015-05-06 ENCOUNTER — Encounter: Payer: Self-pay | Admitting: Family Medicine

## 2015-05-07 ENCOUNTER — Ambulatory Visit (INDEPENDENT_AMBULATORY_CARE_PROVIDER_SITE_OTHER): Payer: Medicare Other | Admitting: Family Medicine

## 2015-05-07 ENCOUNTER — Encounter: Payer: Self-pay | Admitting: Family Medicine

## 2015-05-07 VITALS — BP 118/74 | HR 79 | Temp 98.5°F | Resp 16 | Ht 64.2 in | Wt 131.0 lb

## 2015-05-07 DIAGNOSIS — E7800 Pure hypercholesterolemia, unspecified: Secondary | ICD-10-CM | POA: Diagnosis not present

## 2015-05-07 DIAGNOSIS — J4520 Mild intermittent asthma, uncomplicated: Secondary | ICD-10-CM | POA: Diagnosis not present

## 2015-05-07 DIAGNOSIS — M79671 Pain in right foot: Secondary | ICD-10-CM | POA: Diagnosis not present

## 2015-05-07 DIAGNOSIS — M79672 Pain in left foot: Secondary | ICD-10-CM

## 2015-05-07 DIAGNOSIS — M8580 Other specified disorders of bone density and structure, unspecified site: Secondary | ICD-10-CM

## 2015-05-07 DIAGNOSIS — Z124 Encounter for screening for malignant neoplasm of cervix: Secondary | ICD-10-CM

## 2015-05-07 DIAGNOSIS — M159 Polyosteoarthritis, unspecified: Secondary | ICD-10-CM

## 2015-05-07 DIAGNOSIS — M150 Primary generalized (osteo)arthritis: Secondary | ICD-10-CM

## 2015-05-07 DIAGNOSIS — J301 Allergic rhinitis due to pollen: Secondary | ICD-10-CM | POA: Diagnosis not present

## 2015-05-07 DIAGNOSIS — Z131 Encounter for screening for diabetes mellitus: Secondary | ICD-10-CM | POA: Diagnosis not present

## 2015-05-07 DIAGNOSIS — Z0000 Encounter for general adult medical examination without abnormal findings: Secondary | ICD-10-CM | POA: Diagnosis not present

## 2015-05-07 LAB — LIPID PANEL
Cholesterol: 209 mg/dL — ABNORMAL HIGH (ref 125–200)
HDL: 91 mg/dL (ref >=46)
LDL CALC: 106 mg/dL (ref <130)
Total CHOL/HDL Ratio: 2.3 Ratio (ref <=5.0)
Triglycerides: 59 mg/dL (ref <150)
VLDL: 12 mg/dL (ref <30)

## 2015-05-07 LAB — COMPREHENSIVE METABOLIC PANEL
ALBUMIN: 4.1 g/dL (ref 3.6–5.1)
ALK PHOS: 37 U/L (ref 33–130)
ALT: 24 U/L (ref 6–29)
AST: 27 U/L (ref 10–35)
BILIRUBIN TOTAL: 0.6 mg/dL (ref 0.2–1.2)
BUN: 12 mg/dL (ref 7–25)
CALCIUM: 9.6 mg/dL (ref 8.6–10.4)
CO2: 28 mmol/L (ref 20–31)
CREATININE: 0.81 mg/dL (ref 0.50–0.99)
Chloride: 101 mmol/L (ref 98–110)
GLUCOSE: 97 mg/dL (ref 65–99)
Potassium: 4.1 mmol/L (ref 3.5–5.3)
SODIUM: 140 mmol/L (ref 135–146)
TOTAL PROTEIN: 6.7 g/dL (ref 6.1–8.1)

## 2015-05-07 LAB — POCT URINALYSIS DIP (MANUAL ENTRY)
Bilirubin, UA: NEGATIVE
GLUCOSE UA: NEGATIVE
Ketones, POC UA: NEGATIVE
LEUKOCYTES UA: NEGATIVE
Nitrite, UA: NEGATIVE
PROTEIN UA: NEGATIVE
SPEC GRAV UA: 1.015
UROBILINOGEN UA: 0.2
pH, UA: 8.5

## 2015-05-07 LAB — CBC WITH DIFFERENTIAL/PLATELET
BASOS ABS: 0.1 10*3/uL (ref 0.0–0.1)
BASOS PCT: 1 % (ref 0–1)
Eosinophils Absolute: 0.1 10*3/uL (ref 0.0–0.7)
Eosinophils Relative: 1 % (ref 0–5)
HEMATOCRIT: 43.1 % (ref 36.0–46.0)
HEMOGLOBIN: 14.8 g/dL (ref 12.0–15.0)
LYMPHS PCT: 19 % (ref 12–46)
Lymphs Abs: 1 10*3/uL (ref 0.7–4.0)
MCH: 32.9 pg (ref 26.0–34.0)
MCHC: 34.3 g/dL (ref 30.0–36.0)
MCV: 95.8 fL (ref 78.0–100.0)
MONO ABS: 0.3 10*3/uL (ref 0.1–1.0)
MPV: 9.2 fL (ref 8.6–12.4)
Monocytes Relative: 6 % (ref 3–12)
NEUTROS ABS: 3.7 10*3/uL (ref 1.7–7.7)
NEUTROS PCT: 73 % (ref 43–77)
Platelets: 246 10*3/uL (ref 150–400)
RBC: 4.5 MIL/uL (ref 3.87–5.11)
RDW: 13.3 % (ref 11.5–15.5)
WBC: 5.1 10*3/uL (ref 4.0–10.5)

## 2015-05-07 MED ORDER — MELOXICAM 15 MG PO TABS: 15.0000 mg | ORAL_TABLET | Freq: Every day | ORAL | Status: DC

## 2015-05-07 NOTE — Progress Notes (Signed)
Subjective:    Patient ID: Kristin Tapia, female    DOB: 02/07/1946, 69 y.o.   MRN: [id number]  05/07/2015  Annual Exam and Hyperlipidemia   HPI This 69 y.o. female presents for Annual Wellness Examination with Complete Physical Examination.  Last physical:  05-18-14 Pap smear:  04-18-2013  WNL Mammogram:  04-12-15 Colonoscopy:  09-11-14; +polyps; repeat 5 years. Bone density:  03-09-14 TDAP:  2010 Pneumovax:  2012; 2015 Prevnar Zostavax:  2009 Influenza:  03/11/15 Eye exam:  Every year; +glasses.  Kristin Tapia Dental exam:  Every six months.   R breast diagnostic mammogram performed on 04/11/15. Never underwent evaluation by MD; performed ultrasound.  To consider ductogram if persisted; had two or three episodes since mammogram; itchiness occurs with pressure with bloody drainage.  One or two drops.    Lots of joint pain; seeing Kristin Tapia; taking Meloxicam daily which really helped pain.  Diarrhea continued after stress.  Bruising a lot. Talked with Kristin Tapia about diarrhea; diarrhea improved.  Thin skin.  Has decreased to once daily but has more pain.  Kristin Tapia has tried other things with side effects.  Most pain in hips, feet, knees.  No neck or lower back pain.  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 First skin cancer.  Osteopenia: bone density last year actually improved; walking and exercising daily.  Compliance with calcium and vitamin D.    Toe pain/burning: persistent; s/p evaluation by Kristin Tapia who also suggested neuropathy; has been prescribed Neurontin but did not see benefit from medication and also concerned of potential side effects to medication; toe pain definitely more bothersome at night and not with weight bearing. No swelling of toes.   URINARY INCONTINENCE: TWO EPISODES LAST WEEK OF VERY MILD URINARY INCONTINENCE ON THE WAY TO RESTROOM; symptoms improved last year after visit; did undergo PT at [facility] last year.    Review of Systems  Constitutional: Negative for fever, chills, diaphoresis, activity change, appetite change, fatigue and unexpected weight change.  HENT: Negative for congestion, dental problem, drooling, ear discharge, ear pain, facial swelling, hearing loss, mouth sores, nosebleeds, postnasal drip, rhinorrhea, sinus pressure, sneezing, sore throat, tinnitus, trouble swallowing and voice change.   Eyes: Negative for photophobia, pain, discharge, redness, itching and visual disturbance.  Respiratory: Negative for apnea, cough, choking, chest tightness, shortness of breath, wheezing and stridor.   Cardiovascular: Negative for chest pain, palpitations and leg swelling.  Gastrointestinal: Negative for nausea, vomiting, abdominal pain, diarrhea, constipation, blood in stool, abdominal distention, anal bleeding and rectal pain.  Endocrine: Negative for cold intolerance, heat intolerance, polydipsia, polyphagia and polyuria.  Genitourinary: Negative for dysuria, urgency, frequency, hematuria, flank pain, decreased urine volume, vaginal bleeding, vaginal discharge, enuresis, difficulty urinating, genital sores, vaginal pain, menstrual problem, pelvic pain and dyspareunia.  Musculoskeletal: Positive for arthralgias. Negative for myalgias, back pain, joint swelling, gait problem, neck pain and neck stiffness.  Skin: Negative for color change, pallor, rash and wound.  Allergic/Immunologic: Negative for environmental allergies, food allergies and immunocompromised state.  Neurological: Negative for dizziness, tremors, seizures, syncope, facial asymmetry, speech difficulty, weakness, light-headedness, numbness and headaches.  Hematological: Negative for adenopathy. Does not bruise/bleed easily.  Psychiatric/Behavioral: Positive for sleep disturbance. Negative for suicidal ideas, hallucinations, behavioral problems, confusion, self-injury, dysphoric mood, decreased concentration and  agitation. The patient is not nervous/anxious and is not hyperactive.     Past Medical History  Diagnosis Date  . Arthritis   .  GERD (gastroesophageal reflux disease)   . Cataract   . Thyroid disease   . Anxiety   . Osteoporosis   . Rectocele   . Cystocele   . Detached retina   . Postmenopausal atrophic vaginitis   . Sciatica of left side 01/17/2010  . Metatarsalgia 03/11/2009  . Cavus deformity of foot, acquired 03/11/2009  . Abnormality of gait 03/11/2009  . Hip pain 01/17/2013  . Allergy     immunotherapy/Kristin Tapia.  . Asthma     childhood  . Restless leg syndrome     L leg; s/p consultation by Dohmeier/Neurology.  . Cancer ([place])   . Squamous cell carcinoma of skin 01/25/2015    L shoulder   Past Surgical History  Procedure Laterality Date  . Appendectomy    . Colonoscopy  04/2009    Int & Ext Hemorrhoids, L sided diverticula.  [facility].  Repeat 5 years  . Esophagogastroduodenoscopy  04/2009    tiny HH, minimal antritis, gastric polyps. Magod.  . Tonsillectomy    . Myomectomy      [place].  . Eye surgery     Allergies  Allergen Reactions  . Sulfonamide Derivatives    Current Outpatient Prescriptions  Medication Sig Dispense Refill  . ALPRAZolam (XANAX) 1 MG tablet TAKE ONE-HALF TABLET BY MOUTH AT BEDTIME AS NEEDED FOR SLEEP 30 tablet 0  . Calcium Carbonate-Vitamin D (CALCIUM 600+D3) 600-400 MG-UNIT per tablet Take 1 tablet by mouth 3 (three) times a week.    . cholecalciferol (VITAMIN D) 400 UNITS TABS Take 400 Units by mouth 3 (three) times a week.    . conjugated estrogens (PREMARIN) vaginal cream Place vaginally daily. This is a compounded RX.  [id number]. 1 year supply.  0.02% 42.5 g 12  . cycloSPORINE (RESTASIS) 0.05 % ophthalmic emulsion Place 1 drop into both eyes 2 (two) times daily.    Marland Kitchen estradiol (VIVELLE-DOT) 0.025 MG/24HR Place 1 patch onto the skin 2 (two) times a week.    . fexofenadine (ALLEGRA) 180 MG tablet Take 180 mg by mouth every  morning.    . fluticasone (FLONASE) 50 MCG/ACT nasal spray Place into both nostrils daily.    Marland Kitchen ipratropium (ATROVENT) 0.03 % nasal spray Place 2 sprays into the nose 3 (three) times daily.    . Omega-3 Fatty Acids (FISH OIL) 1200 MG CAPS Take 1,000 mg by mouth daily. Patient takes 2 gel caps daily    . Turmeric 500 MG CAPS Take 500 mg by mouth 2 (two) times daily.    . meloxicam (MOBIC) 15 MG tablet Take 1 tablet (15 mg total) by mouth daily. (Patient not taking: Reported on 05/07/2015) 30 tablet 11  . nitroGLYCERIN (MINITRAN) 0.2 mg/hr patch Place 1/4 patch to the elbow daily and replace next day. (Patient not taking: Reported on 05/07/2015) 30 patch 12  . XOPENEX HFA 45 MCG/ACT inhaler      No current facility-administered medications for this visit.   Social History   Social History  . Marital Status: Married    Spouse Name: N/A  . Number of Children: 2  . Years of Education: N/A   Occupational History  . 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   Social History Main Topics  . Smoking status: Never Smoker   . Smokeless tobacco: Never Used  . Alcohol Use: No  . Drug Use: No  . Sexual Activity:    Partners: Male   Other Topics Concern  . Not on  file   Social History Narrative   Marital status:  Married x 5 years; happily married      Children:  2 children; 1 grandchild      Lives: with husband.      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      Tobacco: never      Alcohol: none      Drugs: none      Exercise: daily.  6 miles walking daily; water aerobics three days per week warm water.  1-5-2.0 hours per day. Walks atleast 10,000 steps per day; warm water exercises three days per week.      Advanced Directives:  +Living Will; FULL CODE; no prolonged measures.        Family History  Problem Relation Age of Onset  . Arthritis Mother     mild  . Hypertension Mother   . Dementia Mother   . Cancer  Mother 65    breast cancer  . COPD Father   . Stroke Father   . Seizures Brother        Objective:    BP 118/74 mmHg  Pulse 79  Temp(Src) 98.5 F (36.9 C) (Oral)  Resp 16  Ht 5' 4.2" (1.631 m)  Wt 131 lb (59.421 kg)  BMI 22.34 kg/m2  SpO2 98% Physical Exam  Constitutional: She is oriented to person, place, and time. She appears well-developed and well-nourished. No distress.  HENT:  Head: Normocephalic and atraumatic.  Right Ear: External ear normal.  Left Ear: External ear normal.  Nose: Nose normal.  Mouth/Throat: Oropharynx is clear and moist.  Eyes: Conjunctivae and EOM are normal. Pupils are equal, round, and reactive to light.  Neck: Normal range of motion and full passive range of motion without pain. Neck supple. No JVD present. Carotid bruit is not present. No thyromegaly present.  Cardiovascular: Normal rate, regular rhythm and normal heart sounds.  Exam reveals no gallop and no friction rub.   No murmur heard. Pulmonary/Chest: Effort normal and breath sounds normal. She has no wheezes. She has no rales. Right breast exhibits no inverted nipple, no mass, no nipple discharge, no skin change and no tenderness. Left breast exhibits no inverted nipple, no mass, no nipple discharge, no skin change and no tenderness. Breasts are symmetrical.  Abdominal: Soft. Bowel sounds are normal. She exhibits no distension and no mass. There is no tenderness. There is no rebound and no guarding.  Genitourinary: Vagina normal and uterus normal. No labial fusion. There is no rash, tenderness, lesion or injury on the right labia. There is no rash, tenderness, lesion or injury on the left labia. Cervix exhibits no motion tenderness, no discharge and no friability. Right adnexum displays no mass, no tenderness and no fullness. Left adnexum displays no mass, no tenderness and no fullness.  Musculoskeletal:       Right shoulder: Normal.       Left shoulder: Normal.       Cervical back: Normal.    Lymphadenopathy:    She has no cervical adenopathy.  Neurological: She is alert and oriented to person, place, and time. She has normal reflexes. No cranial nerve deficit. She exhibits normal muscle tone. Coordination normal.  Skin: Skin is warm and dry. No rash noted. She is not diaphoretic. No erythema. No pallor.  Psychiatric: She has a normal mood and affect. Her behavior is normal. Judgment and thought content normal.  Nursing note and  vitals reviewed.       Assessment & Plan:   1. Encounter for Medicare annual wellness exam   2. Routine physical examination   3. Screening for diabetes mellitus   4. Osteopenia   5. Pure hypercholesterolemia   6. Pain in both feet   7. Primary osteoarthritis involving multiple joints   8. Encounter for screening for cervical cancer      1. Annual Wellness Examination with Complete Physical Examination: anticipatory guidance --- exercise, weight maintenance.  Colonoscopy UTD.  Mammogram UTD.  Obtained last pap smear warranted. Immunizations UTD.  Independent with ADLs. Some mild intermittent urinary incontinence that is being addressed with physical therapy. No evidence of depression.  Low fall risk.  No hearing loss.  Has living will and advanced directives.   2.  Screening DMII: obtain glucose. 3.  Osteopenia: improved in 2015; bone density UTD; continue with Calcium and Vitamin D supplementation daily; also continue with daily weight bearing exercises. 4. Hypercholesterolemia: improved last year; repeat today. 5.  Pain B feet: persistent; refer to neurology for NCS; consistent with neuropathy.  No benefit from Gabapentin in past yet dose may not have been uptitrated. 6.  Primary OA: multiple sites; discussed risk and benefits of daily NSAIDs.  Recommend Mobic 7.5mg  daily alternating with Tylenolol.  7.  Encounter for cervical cancer screening: obtain pap smear; this will be last pap smear indicated based on age.   Orders Placed This Encounter   Procedures  . CBC with Differential/Platelet  . Comprehensive metabolic panel    Order Specific Question:  Has the patient fasted?    Answer:  Yes  . Lipid panel    Order Specific Question:  Has the patient fasted?    Answer:  Yes  . Ambulatory referral to Neurology    Referral Priority:  Routine    Referral Type:  Consultation    Referral Reason:  Specialty Services Required    Requested Specialty:  Neurology    Number of Visits Requested:  1  . POCT urinalysis dipstick   Meds ordered this encounter  Medications  . estradiol (VIVELLE-DOT) 0.025 MG/24HR    Sig: Place 1 patch onto the skin 2 (two) times a week.  . meloxicam (MOBIC) 15 MG tablet    Sig: Take 1 tablet (15 mg total) by mouth daily.    Dispense:  30 tablet    Refill:  11    Return in about 1 year (around 05/06/2016) for complete physical examiniation.     Karlos Scadden Elayne Guerin, M.D. [facility] [address] [place], [place]  [phone] phone [phone] fax

## 2015-05-07 NOTE — Patient Instructions (Signed)

## 2015-05-09 ENCOUNTER — Encounter: Payer: Self-pay | Admitting: Family Medicine

## 2015-05-09 DIAGNOSIS — J4520 Mild intermittent asthma, uncomplicated: Secondary | ICD-10-CM | POA: Insufficient documentation

## 2015-05-09 DIAGNOSIS — J301 Allergic rhinitis due to pollen: Secondary | ICD-10-CM | POA: Insufficient documentation

## 2015-05-10 LAB — PAP IG (IMAGE GUIDED)

## 2015-05-18 ENCOUNTER — Encounter: Payer: Self-pay | Admitting: Family Medicine

## 2015-06-09 ENCOUNTER — Encounter: Payer: Self-pay | Admitting: Family Medicine

## 2015-06-10 ENCOUNTER — Encounter: Payer: Self-pay | Admitting: Podiatry

## 2015-06-10 ENCOUNTER — Ambulatory Visit (INDEPENDENT_AMBULATORY_CARE_PROVIDER_SITE_OTHER): Payer: Medicare Other

## 2015-06-10 ENCOUNTER — Ambulatory Visit (INDEPENDENT_AMBULATORY_CARE_PROVIDER_SITE_OTHER): Payer: Medicare Other | Admitting: Podiatry

## 2015-06-10 VITALS — BP 128/77 | HR 65 | Resp 16

## 2015-06-10 DIAGNOSIS — M779 Enthesopathy, unspecified: Secondary | ICD-10-CM

## 2015-06-10 DIAGNOSIS — M79673 Pain in unspecified foot: Secondary | ICD-10-CM

## 2015-06-10 MED ORDER — TRIAMCINOLONE ACETONIDE 10 MG/ML IJ SUSP
10.0000 mg | Freq: Once | INTRAMUSCULAR | Status: AC
  Administered 2015-06-10: 10 mg

## 2015-06-10 NOTE — Progress Notes (Signed)
   Subjective:    Patient ID: Kristin Tapia, female    DOB: 03-Dec-1945, 69 y.o.   MRN: [id number]  HPI  Pt presents with painful left foot on the medial dorsal side l;asting 2 months, she has tried supprotive shoes with no relief, currently taking meloxicam  Review of Systems  All other systems reviewed and are negative.      Objective:   Physical Exam        Assessment & Plan:

## 2015-06-11 NOTE — Progress Notes (Signed)
Subjective:     Patient ID: Kristin Tapia, female   DOB: 07-21-1945, 69 y.o.   MRN: [id number]  HPI patient states I've had a lot of pain on the inside of my left arch that's been present for 2 months and I've tried oral anti-inflammatories and supportive shoe gear along with ice with no relief   Review of Systems  All other systems reviewed and are negative.      Objective:   Physical Exam  Constitutional: She is oriented to person, place, and time.  Cardiovascular: Intact distal pulses.   Musculoskeletal: Normal range of motion.  Neurological: She is oriented to person, place, and time.  Skin: Skin is warm.  Nursing note and vitals reviewed.  neurovascular status intact muscle strength adequate range of motion was within normal limits with patient found to have quite a bit of discomfort at the anterior tibial insertion to the  cuneiform left with good muscle strength noted and no indication of tendon dysfunction. Good digital perfusion noted and patient well oriented 3     Assessment:      anterior tibial tendinitis left at the insertion    Plan:      H&P condition reviewed and careful sheath injection administered 3 mg Kenalog 5 mill grams Xylocaine after discussing risk. Applied fascial brace to lift up the medial arch advised on supportive shoes ice therapy reduced activity and reappoint to recheck in the next 3-4 weeks

## 2015-07-01 ENCOUNTER — Ambulatory Visit: Payer: Medicare Other | Admitting: Podiatry

## 2015-07-05 ENCOUNTER — Ambulatory Visit (INDEPENDENT_AMBULATORY_CARE_PROVIDER_SITE_OTHER): Payer: Medicare Other | Admitting: Podiatry

## 2015-07-05 ENCOUNTER — Encounter: Payer: Self-pay | Admitting: Podiatry

## 2015-07-05 DIAGNOSIS — M779 Enthesopathy, unspecified: Secondary | ICD-10-CM | POA: Diagnosis not present

## 2015-07-05 NOTE — Progress Notes (Signed)
Subjective:     Patient ID: Kristin Tapia, female   DOB: 19-Mar-1946, 70 y.o.   MRN: [id number]  HPI patient states my foot is improved right around 90%   Review of Systems     Objective:   Physical Exam  neurovascular status intact muscle strength adequate with significant disc diminishment of discomfort in the into tibial insertion left with good muscle strength noted    Assessment:      tendinitis which is improved dramatically    Plan:      advised on physical therapy supportive shoe gear usage and ice therapy. Reappoint as symptoms indicate

## 2015-07-19 ENCOUNTER — Ambulatory Visit: Payer: Self-pay | Admitting: Neurology

## 2015-08-05 ENCOUNTER — Encounter: Payer: Self-pay | Admitting: Sports Medicine

## 2015-08-05 ENCOUNTER — Ambulatory Visit (INDEPENDENT_AMBULATORY_CARE_PROVIDER_SITE_OTHER): Payer: Medicare Other | Admitting: Sports Medicine

## 2015-08-05 VITALS — BP 111/68 | HR 86 | Ht 65.0 in | Wt 127.0 lb

## 2015-08-05 DIAGNOSIS — M25572 Pain in left ankle and joints of left foot: Secondary | ICD-10-CM | POA: Diagnosis not present

## 2015-08-05 NOTE — Progress Notes (Signed)
  Kristin Tapia - 70 y.o. female MRN [id number]  Date of birth: June 14, 1946  SUBJECTIVE:  Including CC & ROS.  No chief complaint on file.   CC: L foot pain  HPI: Kristin Tapia is a 70 YO woman with a PMH of asthma and osteoarthritis who presents today for a follow up of L foot pain. Patient was initially seen by Dr. Oneida Alar on 04/01/2015 for ankle pain. Went to [facility] on 06/10/2015 and had cortisone injection for anterior tibial tendonitis, with significant improvement in her pain for approx 2-3 weeks. Since that time, pain has returned and is located along the medial arch of her left foot. Pain is made worse by ankle flexion, walking, and direct pressure on the area. Walking in place does not hurt. She alternates taking Meloxicam and tylenol every other day, and this improves, but does not relieve her pain. Icy hot and her ankle strap made the pain worse. She denies numbness or tingling in her feet. Notes minimal swelling and erythema over the medial arch. She has noticed that her great toe extends when she walks and she has worn a hole in the top of multiple shoes. Continues to consistently wear custom orthotics.   HISTORY: Past Medical, Surgical, Social, and Family History Reviewed & Updated per EMR.   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 Is a non-smoker.  DATA REVIEWED: previous notes by Dr Paulla Dolly   PHYSICAL EXAM:  VS: BP:111/68 mmHg  HR:86bpm  TEMP: ( )  RESP:   HT:5\' 5"  (165.1 cm)   WT:127 lb (57.607 kg)  BMI:21.2 Gen: well-appearing Caucasian female, resting comfortably in NAD HEENT: normocephalic, atraumatic, EOMI, CN II-XII grossly intact  Pulm: non-labored respirations  Psych: normal mood and affect, A&Ox3    L ankle:  Minimal soft tissue swelling over the medial arch with minimal erythema  Tenderness over the medial cuneiform and  first metatarsal body. No TTP over the forefoot,  midfoot, navicular, cuboid, medial malleolus, or lateral malleolus.  Good ROM with inversion, eversion, plantarflexion and dorsiflexion. Pain with plantar flexion. 5/5 strength with dorsiflexion and plantar flexion  Tinel sign negative Normal gait.  Neurovascularly intact.  ASSESSMENT & PLAN: See problem based charting & AVS for pt instructions.  1.) Chronic longitudinal pain of the arch of the left foot, extending into plantar forefoot - Patient's positive response after steroid injection and physical exam findings consistent with a component of posterior tibialis inflammation. This, in addition to great toe dysfunction suggestive of component of tarsal tunnel syndrome. Suspect pain is related to compression and dysfunction of posterior tibial nerve.  - Will try Arnica gel over the painful arch - Altered custom orthotics to increase heel cushion and arch support in 3 different combinations. Patient will try each pair out for approx 1 week and note symptoms.  - Once she has found the pair of orthotics that provide her the most support, we will alter her other pairs. - Continue alternating meloxicam and tylenol - Follow up in 1 month if symptoms have not improved

## 2015-08-05 NOTE — Assessment & Plan Note (Signed)
I think the best option is still mechanical  Can we get pressure off distal tarsal tunnel and arch.  We will try multiple cushioning options to see which might work

## 2015-08-14 ENCOUNTER — Ambulatory Visit (INDEPENDENT_AMBULATORY_CARE_PROVIDER_SITE_OTHER): Payer: Medicare Other | Admitting: Family Medicine

## 2015-08-14 VITALS — BP 120/78 | HR 78 | Temp 99.0°F | Resp 17 | Ht 64.0 in | Wt 132.0 lb

## 2015-08-14 DIAGNOSIS — N760 Acute vaginitis: Secondary | ICD-10-CM

## 2015-08-14 DIAGNOSIS — B373 Candidiasis of vulva and vagina: Secondary | ICD-10-CM | POA: Diagnosis not present

## 2015-08-14 DIAGNOSIS — B3731 Acute candidiasis of vulva and vagina: Secondary | ICD-10-CM

## 2015-08-14 LAB — POCT URINALYSIS DIP (MANUAL ENTRY)
Bilirubin, UA: NEGATIVE
Glucose, UA: NEGATIVE
Ketones, POC UA: NEGATIVE
Leukocytes, UA: NEGATIVE
Nitrite, UA: NEGATIVE
Protein Ur, POC: NEGATIVE
Spec Grav, UA: 1.015
Urobilinogen, UA: 0.2
pH, UA: 7.5

## 2015-08-14 LAB — POCT WET + KOH PREP: Trich by wet prep: ABSENT

## 2015-08-14 LAB — POC MICROSCOPIC URINALYSIS (UMFC)

## 2015-08-14 MED ORDER — FLUCONAZOLE 150 MG PO TABS: 150.0000 mg | ORAL_TABLET | Freq: Once | ORAL | Status: DC

## 2015-08-14 NOTE — Progress Notes (Signed)
@UMFCLOGO @  By signing my name below, I, Raven Small, attest that this documentation has been prepared under the direction and in the presence of Robyn Haber, MD.  Electronically Signed: Thea Alken, ED Scribe. 08/14/2015. 10:01 AM.  Patient ID: Kristin Tapia MRN: [id number], DOB: 07/25/1945, 70 y.o. Date of Encounter: 08/14/2015, 10:00 AM  Primary Physician: Reginia Forts, MD  Chief Complaint:  Chief Complaint  Patient presents with  . Dysuria  . Vaginitis    HPI: 70 y.o. year old female with history below presents with vaginal irritation that began yesterday. Pt states symptoms started yesterday with irration to labia. She woke up multiple times last night with intense vaginal itching and vaginal pain. [facility]  She reports hx of labia ltears, yeast infection and bacterial infection. Pt is sexually active. She denies vaginal discharge.    Pt is a former Pharmacologist.   Past Medical History  Diagnosis Date  . Arthritis   . GERD (gastroesophageal reflux disease)   . Cataract   . Thyroid disease   . Anxiety   . Osteoporosis   . Rectocele   . Cystocele   . Detached retina   . Postmenopausal atrophic vaginitis   . Sciatica of left side 01/17/2010  . Metatarsalgia 03/11/2009  . Cavus deformity of foot, acquired 03/11/2009  . Abnormality of gait 03/11/2009  . Hip pain 01/17/2013  . Allergy     immunotherapy/[facility] [place].  . Asthma     childhood  . Restless leg syndrome     L leg; s/p consultation by Dohmeier/Neurology.  . Squamous cell carcinoma of skin 01/25/2015    L shoulder     Home Meds: Prior to Admission medications   Medication Sig Start Date End Date Taking? Authorizing Provider  ALPRAZolam Duanne Moron) 1 MG tablet TAKE ONE-HALF TABLET BY MOUTH AT BEDTIME AS NEEDED FOR SLEEP 03/15/15  Yes Wardell Honour, MD  Calcium Carbonate-Vitamin D (CALCIUM 600+D3) 600-400 MG-UNIT per tablet Take 1 tablet by  mouth 3 (three) times a week.   Yes Historical Provider, MD  cholecalciferol (VITAMIN D) 400 UNITS TABS Take 400 Units by mouth 3 (three) times a week.   Yes Historical Provider, MD  cycloSPORINE (RESTASIS) 0.05 % ophthalmic emulsion Place 1 drop into both eyes 2 (two) times daily.   Yes Historical Provider, MD  estradiol (VIVELLE-DOT) 0.025 MG/24HR Place 1 patch onto the skin 2 (two) times a week.   Yes Historical Provider, MD  fexofenadine (ALLEGRA) 180 MG tablet Take 180 mg by mouth every morning.   Yes Historical Provider, MD  ipratropium (ATROVENT) 0.03 % nasal spray Place 2 sprays into the nose 3 (three) times daily.   Yes Historical Provider, MD  meloxicam (MOBIC) 15 MG tablet Take 1 tablet (15 mg total) by mouth daily. 05/07/15  Yes Wardell Honour, MD  Omega-3 Fatty Acids (FISH OIL) 1200 MG CAPS Take 1,000 mg by mouth daily. Patient takes 2 gel caps daily   Yes Historical Provider, MD  Turmeric 500 MG CAPS Take 500 mg by mouth 2 (two) times daily.   Yes Historical Provider, MD  [facility] 45 MCG/ACT inhaler  01/27/15  Yes Historical Provider, MD  conjugated estrogens (PREMARIN) vaginal cream Place vaginally daily. This is a compounded RX.  A010322. 1 year supply.  0.02% Patient not taking: Reported on 08/14/2015 09/24/14   Wardell Honour, MD  fluticasone [facility]) 50 MCG/ACT nasal spray Place into  both nostrils daily. Reported on 08/14/2015    Historical Provider, MD    Allergies:  Allergies  Allergen Reactions  . Sulfonamide Derivatives     Social History   Social History  . Marital Status: Married    Spouse Name: N/A  . Number of Children: 2  . Years of Education: N/A   Occupational History  . 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   Social History Main Topics  . Smoking status: Never Smoker   . Smokeless tobacco: Never Used  . Alcohol Use: No  . Drug Use: No  . Sexual Activity:    Partners: Male    Birth Control/ Protection: Post-menopausal   Other  Topics Concern  . Not on file   Social History Narrative   Marital status:  Married x 47 years; happily married      Children:  2 children; 1 grandchild Berline Lopes)      Lives: with husband.      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      Tobacco: never      Alcohol: none      Drugs: none      Exercise: daily.  6 miles walking daily; water aerobics three days per week warm water.  1-5-2.0 hours per day. Walks atleast 10,000 steps per day; warm water exercises three days per week.      Advanced Directives:  +Living Will; FULL CODE; no prolonged measures.       ADLs: independent with all ADLs; no assistant devices for ambulation.        Review of Systems: Constitutional: negative for chills, fever, night sweats, weight changes, or fatigue  HEENT: negative for vision changes, hearing loss, congestion, rhinorrhea, ST, epistaxis, or sinus pressure Cardiovascular: negative for chest pain or palpitations Respiratory: negative for hemoptysis, wheezing, shortness of breath, or cough Abdominal: negative for abdominal pain, nausea, vomiting, diarrhea, or constipation Dermatological: negative for rash Neurologic: negative for headache, dizziness, or syncope All other systems reviewed and are otherwise negative with the exception to those above and in the HPI.   Physical Exam: Blood pressure 120/78, pulse 78, temperature 99 F (37.2 C), temperature source Oral, resp. rate 17, height 5\' 4"  (1.626 m), weight 132 lb (59.875 kg), SpO2 98 %., Body mass index is 22.65 kg/(m^2). General: Well developed, well nourished, in no acute distress. Head: Normocephalic, atraumatic, eyes without discharge, sclera non-icteric, nares are without discharge. Bilateral auditory canals clear, TM's are without perforation, pearly grey and translucent with reflective cone of light bilaterally. Oral cavity moist, posterior pharynx without exudate, erythema,  peritonsillar abscess, or post nasal drip.  Neck: Supple. No thyromegaly. Full ROM. No lymphadenopathy. Lungs: Clear bilaterally to auscultation without wheezes, rales, or rhonchi. Breathing is unlabored. Heart: RRR with S1 S2. No murmurs, rubs, or gallops appreciated. Abdomen: Soft, non-tender, non-distended with normoactive bowel sounds. No hepatomegaly. No rebound/guarding. No obvious abdominal masses. Msk:  Strength and tone normal for age. Extremities/Skin: Warm and dry. No clubbing or cyanosis. No edema. No rashes or suspicious lesions. Neuro: Alert and oriented X 3. Moves all extremities spontaneously. Gait is normal. CNII-XII grossly in tact. Psych:  Responds to questions appropriately with a normal affect.   Pelvic exam: red swollen glistening without exudate.   Labs: Results for orders placed or performed in visit on 08/14/15  POCT urinalysis dipstick  Result Value Ref Range   Color, UA yellow yellow  Clarity, UA cloudy (A) clear   Glucose, UA negative negative   Bilirubin, UA negative negative   Ketones, POC UA negative negative   Spec Grav, UA 1.015    Blood, UA moderate (A) negative   pH, UA 7.5    Protein Ur, POC negative negative   Urobilinogen, UA 0.2    Nitrite, UA Negative Negative   Leukocytes, UA Negative Negative  POCT Microscopic Urinalysis ([facility])  Result Value Ref Range   WBC,UR,HPF,POC Few (A) None WBC/hpf   RBC,UR,HPF,POC Few (A) None RBC/hpf   Bacteria Few (A) None, Too numerous to count   Mucus Present (A) Absent   Epithelial Cells, UR Per Microscopy Moderate (A) None, Too numerous to count cells/hpf  POCT Wet + KOH Prep  Result Value Ref Range   Yeast by KOH Present Present, Absent   Yeast by wet prep Present Present, Absent   WBC by wet prep Few None, Few, Too numerous to count   Clue Cells Wet Prep HPF POC None None, Too numerous to count   Trich by wet prep Absent Present, Absent   Bacteria Wet Prep HPF POC Few None, Few, Too numerous to count    Epithelial Cells By [employer] ([facility]) Few None, Few, Too numerous to count   RBC,UR,HPF,POC None None RBC/hpf     ASSESSMENT AND PLAN:  70 y.o. year old female with hematuria thought to be secondary to the vaginitis, and monilia This chart was scribed in my presence and reviewed by me personally.    ICD-9-CM ICD-10-CM   1. Vaginitis and vulvovaginitis 616.10 N76.0 POCT urinalysis dipstick     POCT Microscopic Urinalysis ([facility])     POCT Wet + KOH Prep     fluconazole (DIFLUCAN) 150 MG tablet  2. Monilial vulvitis 112.1 B37.3 fluconazole (DIFLUCAN) 150 MG tablet     Signed, Robyn Haber, MD 08/14/2015 10:00 AM

## 2015-08-14 NOTE — Patient Instructions (Signed)

## 2015-09-13 ENCOUNTER — Encounter: Payer: Self-pay | Admitting: Sports Medicine

## 2015-10-05 ENCOUNTER — Other Ambulatory Visit: Payer: Self-pay | Admitting: *Deleted

## 2015-10-05 MED ORDER — PREDNISONE 20 MG PO TABS: 20.0000 mg | ORAL_TABLET | Freq: Two times a day (BID) | ORAL | Status: DC

## 2015-11-24 ENCOUNTER — Other Ambulatory Visit: Payer: Self-pay | Admitting: Radiology

## 2016-01-27 ENCOUNTER — Other Ambulatory Visit: Payer: Self-pay

## 2016-01-27 ENCOUNTER — Encounter: Payer: Self-pay | Admitting: Family Medicine

## 2016-01-27 MED ORDER — ESTRADIOL 0.025 MG/24HR TD PTTW: 1.0000 | MEDICATED_PATCH | TRANSDERMAL | 0 refills | Status: DC

## 2016-01-27 NOTE — Telephone Encounter (Signed)
Dr Tamala Julian, at pt's CPE with you last Nov, your notes show Rx for the estradiol patches being prescribed and your plan was for pt to RTC in 1 year. I went ahead and OKd the patches (when req'd by pharm) from that info. But see pt's email stating that she is not using the patches, she is having cream compounded at [facility]. Is it Ok to call [place] and give her RFs of the cream through her appt w/you 06/13/16?

## 2016-02-02 MED ORDER — ESTROGENS, CONJUGATED 0.625 MG/GM VA CREA: TOPICAL_CREAM | Freq: Every day | VAGINAL | 12 refills | Status: DC

## 2016-02-08 ENCOUNTER — Ambulatory Visit: Payer: Self-pay | Admitting: Sports Medicine

## 2016-02-09 ENCOUNTER — Telehealth: Payer: Self-pay

## 2016-02-09 ENCOUNTER — Encounter: Payer: Self-pay | Admitting: Sports Medicine

## 2016-02-09 ENCOUNTER — Ambulatory Visit (INDEPENDENT_AMBULATORY_CARE_PROVIDER_SITE_OTHER): Payer: Medicare Other | Admitting: Sports Medicine

## 2016-02-09 DIAGNOSIS — M5432 Sciatica, left side: Secondary | ICD-10-CM | POA: Diagnosis not present

## 2016-02-09 MED ORDER — DIAZEPAM 2 MG PO TABS: 2.0000 mg | ORAL_TABLET | Freq: Two times a day (BID) | ORAL | 2 refills | Status: DC | PRN

## 2016-02-09 NOTE — Assessment & Plan Note (Signed)
meloxicam seemed to help and we will continue that We will start her on low-dose diazepam 2 mg at night She is unable to sleep more than 2 or 3 hours at a time and I want to see if this improves using the diazepam She can continue walking 10,000 steps per day and see if that improves if we can improve sleep patterns at night and lessen muscle spasm

## 2016-02-09 NOTE — Patient Instructions (Signed)
You clearly have some sciatic issues that I think cause the sleep disturbance and keep your left leg from recovering  On walking and exam you are not firing those mm as well  Use 1 2 mg diazepam at night  We will test 30 days  Return after a month for [place] to discuss long term strategy

## 2016-02-09 NOTE — Progress Notes (Signed)
Chief complaint-pain in the left leg  Patient has pain radiating down leg in sciatic distribution She notes increased wear on the medial aspect of her shoes Pain has been more significant at night She has been on a trip traveling along the [facility] When she was walking less she had less pain When she travels she uses some alprazolam for sleep and that also made the pain less meloxicam has helped the pain in the past  Review of systems Pain at the left SI joint intermittently No pain with coughing or sneezing No pain with bending her back Radiation his lateral leg and medial aspect of the foot  Physical examination Thin female in no acute distress BP 114/72   Pulse 90   Ht 5\' 4"  (1.626 m)   Wt 128 lb (58.1 kg)   BMI 21.97 kg/m   Normal back motion without pain Straight leg raise to 80 is pain free No swelling or direct tenderness in the leg Heel raises show that the left posterior tibialis is not firing as well as the right  Walking gait shows that her push off on the left is less than the right

## 2016-02-16 ENCOUNTER — Encounter: Payer: Self-pay | Admitting: Physician Assistant

## 2016-02-16 ENCOUNTER — Ambulatory Visit (INDEPENDENT_AMBULATORY_CARE_PROVIDER_SITE_OTHER): Payer: Medicare Other | Admitting: Physician Assistant

## 2016-02-16 VITALS — BP 116/72 | HR 66 | Temp 97.7°F | Resp 17 | Ht 64.0 in | Wt 134.0 lb

## 2016-02-16 DIAGNOSIS — N898 Other specified noninflammatory disorders of vagina: Secondary | ICD-10-CM | POA: Diagnosis not present

## 2016-02-16 DIAGNOSIS — B379 Candidiasis, unspecified: Secondary | ICD-10-CM

## 2016-02-16 LAB — POC MICROSCOPIC URINALYSIS (UMFC): MUCUS RE: ABSENT

## 2016-02-16 LAB — POCT URINALYSIS DIP (MANUAL ENTRY)
BILIRUBIN UA: NEGATIVE
Bilirubin, UA: NEGATIVE
Glucose, UA: NEGATIVE
LEUKOCYTES UA: NEGATIVE
NITRITE UA: NEGATIVE
PROTEIN UA: NEGATIVE
Spec Grav, UA: 1.015
Urobilinogen, UA: 0.2
pH, UA: 7

## 2016-02-16 LAB — POCT WET + KOH PREP: Trich by wet prep: ABSENT

## 2016-02-16 LAB — POCT SKIN KOH: Skin KOH, POC: POSITIVE

## 2016-02-16 MED ORDER — FLUCONAZOLE 150 MG PO TABS: 150.0000 mg | ORAL_TABLET | ORAL | 0 refills | Status: DC | PRN

## 2016-02-16 MED ORDER — NYSTATIN 100000 UNIT/GM EX CREA: 1.0000 "application " | TOPICAL_CREAM | Freq: Two times a day (BID) | CUTANEOUS | 0 refills | Status: DC

## 2016-02-16 NOTE — Progress Notes (Addendum)
[facility] [address], [place] [address] [phone]  By signing my name below I, Raven Small, attest that this documentation has been prepared under the direction and in the presence of Ivar Drape PA. Electonically Signed. Raven Small, Scribe 02/16/2016 at 3:58 PM  Date:  02/16/2016   Name:  Kristin Tapia   DOB:  01-06-1946   MRN:  [id number]  PCP:  Reginia Forts, MD    History of Present Illness: Chief Complaint  Patient presents with   Vaginal Itching   Vaginal Discharge    off and on for several months    Kristin Tapia is a 70 y.o. female patient who presents to [facility] with vaginal itching. She reports erythematous vaginal tissue around clitoris noticed more in the morning, vaginal itching and white-wax like substance expressed from clitoris as well as dysuria. She has tried vagisil; also uses estrogen cream twice a week. Pt swims at the [facility] throughout the week but bathes right afterwards.  She reports hx of multiple yeast infection and bacterial infections. She denies hematuria, abdominal pain, urinary frequency, vaginal odor.    Patient Active Problem List   Diagnosis Date Noted   Seasonal allergic rhinitis due to pollen 05/09/2015   Asthma, mild intermittent 05/09/2015   Right hamstring muscle strain 01/12/2015   Tendinopathy of right rotator cuff 12/10/2014   Primary osteoarthritis involving multiple joints 05/23/2014   Osteopenia 05/23/2014   Right tennis elbow 03/05/2014   Pain in joint, ankle and foot 05/06/2013   Hip pain 01/17/2013   Screening for lipoid disorders 05/31/2012   Screening for diabetes mellitus 05/31/2012   Routine general medical examination at a health care facility 05/31/2012   Need for prophylactic vaccination and inoculation against influenza 05/31/2012   Sciatica of left side 01/17/2010   Enthesopathy of ankle and tarsus 03/11/2009   CAVUS DEFORMITY OF FOOT, ACQUIRED 03/11/2009    ABNORMALITY OF GAIT 03/11/2009    Past Medical History:  Diagnosis Date   Abnormality of gait 03/11/2009   Allergy    immunotherapy/[facility] [place].   Anxiety    Arthritis    Asthma    childhood   Cataract    Cavus deformity of foot, acquired 03/11/2009   Cystocele    Detached retina    GERD (gastroesophageal reflux disease)    Hip pain 01/17/2013   Metatarsalgia 03/11/2009   Osteoporosis    Postmenopausal atrophic vaginitis    Rectocele    Restless leg syndrome    L leg; s/p consultation by Dohmeier/Neurology.   Sciatica of left side 01/17/2010   Squamous cell carcinoma of skin 01/25/2015   L shoulder   Thyroid disease     Past Surgical History:  Procedure Laterality Date   APPENDECTOMY     COLONOSCOPY  04/2009   Int & Ext Hemorrhoids, L sided diverticula.  [place].  Repeat 5 years   ESOPHAGOGASTRODUODENOSCOPY  04/2009   tiny HH, minimal antritis, gastric polyps. [facility].   EYE SURGERY     MYOMECTOMY     [place].   TONSILLECTOMY      Social History  Substance Use Topics   Smoking status: Never Smoker   Smokeless tobacco: Never Used   Alcohol use No    Family History  Problem Relation Age of Onset   Arthritis Mother     mild   Hypertension Mother    Dementia Mother    Cancer Mother 31    breast cancer   COPD Father  Stroke Father    Seizures Brother     Allergies  Allergen Reactions   Sulfonamide Derivatives     Medication list has been reviewed and updated.  Current Outpatient Prescriptions on File Prior to Visit  Medication Sig Dispense Refill   acetaminophen (TYLENOL) 325 MG tablet Take 325 mg by mouth every 6 (six) hours as needed.     ALPRAZolam (XANAX) 1 MG tablet TAKE ONE-HALF TABLET BY MOUTH AT BEDTIME AS NEEDED FOR SLEEP (Patient not taking: Reported on 02/16/2016) 30 tablet 0   Calcium Carbonate-Vitamin D (CALCIUM 600+D3) 600-400 MG-UNIT per tablet Take 1 tablet by mouth 3 (three) times a week.      cholecalciferol (VITAMIN D) 400 UNITS TABS Take 400 Units by mouth 3 (three) times a week.     conjugated estrogens (PREMARIN) vaginal cream Place vaginally daily. This is a compounded RX.  [id number]. 1 year supply.  0.02% 42.5 g 12   cycloSPORINE (RESTASIS) 0.05 % ophthalmic emulsion Place 1 drop into both eyes 2 (two) times daily.     diazepam (VALIUM) 2 MG tablet Take 1 tablet (2 mg total) by mouth every 12 (twelve) hours as needed for anxiety. 30 tablet 2   fexofenadine (ALLEGRA) 180 MG tablet Take 180 mg by mouth every morning.     ipratropium (ATROVENT) 0.03 % nasal spray Place 2 sprays into the nose 3 (three) times daily.     meloxicam (MOBIC) 15 MG tablet Take 1 tablet (15 mg total) by mouth daily. 30 tablet 11   Omega-3 Fatty Acids (FISH OIL) 1200 MG CAPS Take 1,000 mg by mouth daily. Patient takes 2 gel caps daily     Turmeric 500 MG CAPS Take 500 mg by mouth 2 (two) times daily.     XOPENEX HFA 45 MCG/ACT inhaler      No current facility-administered medications on file prior to visit.     Review of Systems  Constitutional: Negative for chills and fever.  Gastrointestinal: Negative for abdominal pain.  Genitourinary: Positive for dysuria. Negative for frequency, hematuria and urgency.       Positive: vaginal itching, vaginal discharge   ROS unremarkable unless otherwise specified.  Physical Examination: BP 116/72 (BP Location: Left Arm, Patient Position: Sitting, Cuff Size: Normal)    Pulse 66    Temp 97.7 F (36.5 C) (Oral)    Resp 17    Ht 5\' 4"  (1.626 m)    Wt 134 lb (60.8 kg)    SpO2 99%    BMI 23.00 kg/m  Ideal Body Weight: @FLOWAMB [id number]  Physical Exam  Constitutional: She is oriented to person, place, and time. She appears well-developed and well-nourished. No distress.  HENT:  Head: Normocephalic and atraumatic.  Right Ear: External ear normal.  Left Ear: External ear normal.  Eyes: Conjunctivae and EOM are normal. Pupils are equal, round,  and reactive to light.  Cardiovascular: Normal rate.   Pulmonary/Chest: Effort normal. No respiratory distress.  Genitourinary:  Genitourinary Comments: Vaginal canal without rugae consistent with vaginitis. White discharge with amine odor.    Neurological: She is alert and oriented to person, place, and time.  Skin: She is not diaphoretic.  Psychiatric: She has a normal mood and affect. Her behavior is normal.   Results for orders placed or performed in visit on 02/16/16  POCT Wet + KOH Prep  Result Value Ref Range   Yeast by KOH Present Present, Absent   Yeast by wet prep Present Present, Absent   WBC  by wet prep Few None, Few, Too numerous to count   Clue Cells Wet Prep HPF POC Few (A) None, Too numerous to count   Trich by wet prep Absent Present, Absent   Bacteria Wet Prep HPF POC Many (A) None, Few, Too numerous to count   Epithelial Cells By [employer] ([facility]) None None, Few, Too numerous to count   RBC,UR,HPF,POC None None RBC/hpf  POCT urinalysis dipstick  Result Value Ref Range   Color, UA yellow yellow   Clarity, UA clear clear   Glucose, UA negative negative   Bilirubin, UA negative negative   Ketones, POC UA negative negative   Spec Grav, UA 1.015    Blood, UA trace-intact (A) negative   pH, UA 7.0    Protein Ur, POC negative negative   Urobilinogen, UA 0.2    Nitrite, UA Negative Negative   Leukocytes, UA Negative Negative  POCT Microscopic Urinalysis ([facility])  Result Value Ref Range   WBC,UR,HPF,POC None None WBC/hpf   RBC,UR,HPF,POC None None RBC/hpf   Bacteria Few (A) None, Too numerous to count   Mucus Absent Absent   Epithelial Cells, UR Per Microscopy Few (A) None, Too numerous to count cells/hpf  POCT Skin KOH  Result Value Ref Range   Skin KOH, POC Positive     Assessment and Plan: Kristin Tapia is a 70 y.o. female who is here today for cc of clitoral itching. --advised diflucan and nystatin cream.  She will attempt probiotic supplement, during the  duration of this medication.  Attempt to ward off the BV. rtc as needed. Vaginal discharge - Plan: POCT Wet + KOH Prep, POCT urinalysis dipstick, POCT Microscopic Urinalysis ([facility]), POCT Skin KOH  Yeast infection - Plan: fluconazole (DIFLUCAN) 150 MG tablet, nystatin cream (MYCOSTATIN)   Ivar Drape, PA-C [facility] 02/16/2016 3:58 PM

## 2016-02-16 NOTE — Patient Instructions (Addendum)
This appears to be a vaginal infection.   Please take the medication.  I would also like you to apply the cream in the clitoral and vaginal area. If your symptoms do not improve, please contact me.   Please also take a probiotic supplement for the next 2 weeks as well.  Monilial Vaginitis Vaginitis in a soreness, swelling and redness (inflammation) of the vagina and vulva. Monilial vaginitis is not a sexually transmitted infection. CAUSES  Yeast vaginitis is caused by yeast (candida) that is normally found in your vagina. With a yeast infection, the candida has overgrown in number to a point that upsets the chemical balance. SYMPTOMS   White, thick vaginal discharge.  Swelling, itching, redness and irritation of the vagina and possibly the lips of the vagina (vulva).  Burning or painful urination.  Painful intercourse. DIAGNOSIS  Things that may contribute to monilial vaginitis are:  Postmenopausal and virginal states.  Pregnancy.  Infections.  Being tired, sick or stressed, especially if you had monilial vaginitis in the past.  Diabetes. Good control will help lower the chance.  Birth control pills.  Tight fitting garments.  Using bubble bath, feminine sprays, douches or deodorant tampons.  Taking certain medications that kill germs (antibiotics).  Sporadic recurrence can occur if you become ill. TREATMENT  Your caregiver will give you medication.  There are several kinds of anti monilial vaginal creams and suppositories specific for monilial vaginitis. For recurrent yeast infections, use a suppository or cream in the vagina 2 times a week, or as directed.  Anti-monilial or steroid cream for the itching or irritation of the vulva may also be used. Get your caregiver's permission.  Painting the vagina with methylene blue solution may help if the monilial cream does not work.  Eating yogurt may help prevent monilial vaginitis. HOME CARE INSTRUCTIONS   Finish all  medication as prescribed.  Do not have sex until treatment is completed or after your caregiver tells you it is okay.  Take warm sitz baths.  Do not douche.  Do not use tampons, especially scented ones.  Wear cotton underwear.  Avoid tight pants and panty hose.  Tell your sexual partner that you have a yeast infection. They should go to their caregiver if they have symptoms such as mild rash or itching.  Your sexual partner should be treated as well if your infection is difficult to eliminate.  Practice safer sex. Use condoms.  Some vaginal medications cause latex condoms to fail. Vaginal medications that harm condoms are:  Cleocin cream.  Butoconazole (Femstat).  Terconazole (Terazol) vaginal suppository.  Miconazole (Monistat) (may be purchased over the counter). SEEK MEDICAL CARE IF:   You have a temperature by mouth above 102 F (38.9 C).  The infection is getting worse after 2 days of treatment.  The infection is not getting better after 3 days of treatment.  You develop blisters in or around your vagina.  You develop vaginal bleeding, and it is not your menstrual period.  You have pain when you urinate.  You develop intestinal problems.  You have pain with sexual intercourse.   This information is not intended to replace advice given to you by your health care provider. Make sure you discuss any questions you have with your health care provider.   Document Released: 03/22/2005 Document Revised: 09/04/2011 Document Reviewed: 12/14/2014 Elsevier Interactive Patient Education 2016 [employer].     IF you received an x-ray today, you will receive an invoice from [facility]. Please contact  [facility] at [phone] with questions or concerns regarding your invoice.   IF you received labwork today, you will receive an invoice from [employer]. Please contact [facility] at [phone] with questions or  concerns regarding your invoice.   Our billing staff will not be able to assist you with questions regarding bills from these companies.  You will be contacted with the lab results as soon as they are available. The fastest way to get your results is to activate your My Chart account. Instructions are located on the last page of this paperwork. If you have not heard from [place] regarding the results in 2 weeks, please contact this office.

## 2016-02-24 ENCOUNTER — Encounter: Payer: Self-pay | Admitting: Physician Assistant

## 2016-03-07 ENCOUNTER — Encounter: Payer: Self-pay | Admitting: Physician Assistant

## 2016-03-07 DIAGNOSIS — B379 Candidiasis, unspecified: Secondary | ICD-10-CM

## 2016-03-08 ENCOUNTER — Other Ambulatory Visit: Payer: Self-pay | Admitting: *Deleted

## 2016-03-08 ENCOUNTER — Encounter: Payer: Self-pay | Admitting: Sports Medicine

## 2016-03-08 ENCOUNTER — Ambulatory Visit (INDEPENDENT_AMBULATORY_CARE_PROVIDER_SITE_OTHER): Payer: Medicare Other | Admitting: Sports Medicine

## 2016-03-08 DIAGNOSIS — M5432 Sciatica, left side: Secondary | ICD-10-CM | POA: Diagnosis not present

## 2016-03-08 MED ORDER — MELOXICAM 7.5 MG PO TABS: 7.5000 mg | ORAL_TABLET | Freq: Two times a day (BID) | ORAL | 6 refills | Status: DC

## 2016-03-08 NOTE — Patient Instructions (Signed)
Add 1 foot balance/ eyes closed Stand and reach  Keep up hip rotation and stretches  Add a few planks   Add the heel taps for 90 secs  Meloxicam you can adjust by pain level  Keept he diazepam at night

## 2016-03-08 NOTE — Assessment & Plan Note (Signed)
This is definitetly improved since we added the diazepam I think that is improving her sleep pattern She has less muscle spasm  Continue this Since her pain level is less we will take the meloxicam 7.5 mg rather than 15  Add some balance exercises and core exercise  Recheck with me in 3-4 months prn

## 2016-03-08 NOTE — Progress Notes (Signed)
Chief complaint: Left-sided sciatica  Since her last visit she has been using diazepam 2 mg at night This as dramatically lessened her sciatic symptoms She now is able to get 3-4 hours of continuous sleep and then will be able to go back to sleep after changing position Her day time pain in the low back and hip are less She has been using meloxicam daily but only adds Tylenol once or twice a week Some days are without low back or hip pain so she is wondering about stopping daily meloxicam  Exercise regimen is good with water exercises 2x week she does some strength work She walks about 10,000 steps daily  Social history  nonsmoker Retired Charity fundraiser professor  Review of systems No weakness in the lower leg No falls She does feel like balances sometimes not as good No numbness  Physical examination Physically fit older female BP 112/73   Pulse 92   Ht 5\' 4"  (1.626 m)   Wt 134 lb (60.8 kg)   BMI 23.00 kg/m   Hip range of motion is greater than 100 bilaterally SI joint motion is slightly limited on the left FABER is tight on the left side Straight leg raise is to 90 bilaterally with no pain Lumbar flexion and extension is normal She is able to do side planks and forward planks 1 foot balance is slightly unsteady on a stand and reach test

## 2016-03-13 MED ORDER — NYSTATIN 100000 UNIT/GM EX CREA: 1.0000 "application " | TOPICAL_CREAM | Freq: Two times a day (BID) | CUTANEOUS | 0 refills | Status: DC

## 2016-03-13 MED ORDER — FLUCONAZOLE 150 MG PO TABS: 150.0000 mg | ORAL_TABLET | ORAL | 0 refills | Status: DC

## 2016-03-13 NOTE — Telephone Encounter (Signed)
Discussed with patient, and it appears that she continues to have the itching though it is less near clitoral hood, and more every where.  Nystatin is helping.   We will treat again with another q three day round.  Recurrent can have three dosing on day 1, 4, and 7.  We will do 1 and 4.   Advised to increase her premarin from two times per week, to three. She can also try a very diluted baking soda in warm water soak.   She will let me know if her symptoms do not improve.

## 2016-03-24 ENCOUNTER — Telehealth: Payer: Self-pay | Admitting: Physician Assistant

## 2016-03-24 LAB — HM DEXA SCAN

## 2016-03-24 NOTE — Telephone Encounter (Signed)
Fax received, signed and faxed back.

## 2016-03-24 NOTE — Telephone Encounter (Signed)
Patient is at [facility] for [place], but order faxed here 2 days ago has not been returned. I asked that it be re-faxed to me, so that I can expedite getting it signed and returned.

## 2016-03-31 ENCOUNTER — Other Ambulatory Visit: Payer: Self-pay | Admitting: Physician Assistant

## 2016-03-31 DIAGNOSIS — B9689 Other specified bacterial agents as the cause of diseases classified elsewhere: Secondary | ICD-10-CM

## 2016-03-31 DIAGNOSIS — N760 Acute vaginitis: Secondary | ICD-10-CM

## 2016-04-10 ENCOUNTER — Telehealth: Payer: Self-pay | Admitting: Family Medicine

## 2016-04-10 NOTE — Telephone Encounter (Signed)
MyChart message sent to patient regarding bone density scan results:   Ms. Kristin Tapia,  I hope you are doing well.I am reviewing your bone density scan results from 03/24/2016 at [facility]. Your bone density at the R hip has worsened from 2015 and is now in the OSTEOPOROSIS range (T score changed from -2.00 in 2015 to -2.60 in 2017) which is a significant change. Your bone density at the L hip has only slightly worsened from 2015 (T score decreased from -1.90 in 2015 to -2.0 in 2017).The bone densities of your lumbar spine and L hip are in the OSTEOPENIA range.The RIGHT hip is the only reading in the OSTEOPOROSIS range.I would recommend one of two treatment options at this time:  1.Start Fosamax or Boniva for new onset osteoporosis; these type medications prevent further bone loss. 2.Continue current treatment plan (weight bearingexercise, calcium supplementation, vitamin D supplementation) and repeat bone density scan in two years; if the R hip remains unchanged or has worsened, then start Fosamax or Boniva.   Please let me know your thoughts on treatment options at this time.  Sincerely, Norwood Levo, M.D. [facility] [address] [place], [place]  [address] [phone] phone [phone] fax

## 2016-05-04 ENCOUNTER — Encounter: Payer: Self-pay | Admitting: Gynecology

## 2016-05-04 ENCOUNTER — Ambulatory Visit (INDEPENDENT_AMBULATORY_CARE_PROVIDER_SITE_OTHER): Payer: Medicare Other | Admitting: Gynecology

## 2016-05-04 VITALS — BP 110/70 | Ht 64.0 in | Wt 132.0 lb

## 2016-05-04 DIAGNOSIS — N952 Postmenopausal atrophic vaginitis: Secondary | ICD-10-CM

## 2016-05-04 DIAGNOSIS — B373 Candidiasis of vulva and vagina: Secondary | ICD-10-CM | POA: Diagnosis not present

## 2016-05-04 DIAGNOSIS — N898 Other specified noninflammatory disorders of vagina: Secondary | ICD-10-CM

## 2016-05-04 DIAGNOSIS — B3731 Acute candidiasis of vulva and vagina: Secondary | ICD-10-CM | POA: Insufficient documentation

## 2016-05-04 LAB — WET PREP FOR TRICH, YEAST, CLUE
CLUE CELLS WET PREP: NONE SEEN
TRICH WET PREP: NONE SEEN

## 2016-05-04 NOTE — Progress Notes (Signed)
   Patient is a 70 year old who is now been seen the office in over 10 years her PCP is been doing her blood work, Pap smears, scheduling her colonoscopy and bone densities and vaccines are all up-to-date. The reason for visit today is she's been suffering from recurrent yeast infection. She is currently on vaginal estradiol which she applies twice a week and recently begin applying 3 times a week. She has been treated several times this year with Diflucan for yeast infections and they reoccur. Patient is married in a monogamous relationship. No past history of any STDs or abnormal Pap smears. She denies any dysuria, frequency, or any back pain or nausea or vomiting.  Pelvic: Bartholin urethra Skene glands with atrophic changes Vagina: No lesions slight white discharge no odor Cervix: No lesions or discharge Uterus: Anteverted normal size shape and consistency Adnexa: No palpable masses or tenderness Rectal exam not done  Wet prep many/hyphae present. Few white blood cells moderate bacteria  Assessment/plan: Patient with clinical evidence of recurrent yeast infection she is taking a daily oral probiotic. Patient does not douche. We are going to obtain a vaginal culture today which will take a few days to identify specific yeast species. In the meantime she will be placed on boric acid capsule 600 mg capsule to apply intravaginally daily at bedtime for 2 weeks as we wait for the culture.

## 2016-05-04 NOTE — Patient Instructions (Signed)

## 2016-05-07 LAB — CANDIDIASIS, PCR
C. albicans, DNA: DETECTED — AB
C. glabrata, DNA: NOT DETECTED
C. parapsilosis, DNA: NOT DETECTED
C. tropicalis, DNA: NOT DETECTED

## 2016-05-08 ENCOUNTER — Other Ambulatory Visit: Payer: Self-pay

## 2016-05-08 MED ORDER — FLUCONAZOLE 150 MG PO TABS: 150.0000 mg | ORAL_TABLET | ORAL | 1 refills | Status: DC

## 2016-06-12 ENCOUNTER — Telehealth: Payer: Self-pay | Admitting: *Deleted

## 2016-06-12 NOTE — Telephone Encounter (Signed)
Yes it will take time but I would recommend because of her vaginal atrophy to consider Vagifem 10 g vaginal tablet twice a week in addition to the Diflucan 150 mg by mouth that she takes weekly

## 2016-06-12 NOTE — Telephone Encounter (Signed)
Pt has been prescribed Diflucan 150 mg tablet once weekly for 6 months for recurrent yeast infections, patient has taken 4 pills and she is still having symptoms asked will it take time for the medication to start to help with relief? Diflucan was prescribed in Nov. Please advise

## 2016-06-13 ENCOUNTER — Ambulatory Visit (INDEPENDENT_AMBULATORY_CARE_PROVIDER_SITE_OTHER): Payer: Medicare Other | Admitting: Family Medicine

## 2016-06-13 VITALS — BP 118/72 | HR 72 | Temp 98.9°F | Resp 16 | Ht 64.0 in | Wt 132.0 lb

## 2016-06-13 DIAGNOSIS — M810 Age-related osteoporosis without current pathological fracture: Secondary | ICD-10-CM | POA: Diagnosis not present

## 2016-06-13 DIAGNOSIS — M150 Primary generalized (osteo)arthritis: Secondary | ICD-10-CM

## 2016-06-13 DIAGNOSIS — Z0000 Encounter for general adult medical examination without abnormal findings: Secondary | ICD-10-CM

## 2016-06-13 DIAGNOSIS — M2550 Pain in unspecified joint: Secondary | ICD-10-CM | POA: Diagnosis not present

## 2016-06-13 DIAGNOSIS — M159 Polyosteoarthritis, unspecified: Secondary | ICD-10-CM

## 2016-06-13 DIAGNOSIS — E7800 Pure hypercholesterolemia, unspecified: Secondary | ICD-10-CM | POA: Diagnosis not present

## 2016-06-13 DIAGNOSIS — F5104 Psychophysiologic insomnia: Secondary | ICD-10-CM | POA: Diagnosis not present

## 2016-06-13 DIAGNOSIS — J301 Allergic rhinitis due to pollen: Secondary | ICD-10-CM

## 2016-06-13 DIAGNOSIS — N952 Postmenopausal atrophic vaginitis: Secondary | ICD-10-CM | POA: Diagnosis not present

## 2016-06-13 DIAGNOSIS — J4520 Mild intermittent asthma, uncomplicated: Secondary | ICD-10-CM

## 2016-06-13 LAB — POCT URINALYSIS DIP (MANUAL ENTRY)
BILIRUBIN UA: NEGATIVE
BILIRUBIN UA: NEGATIVE
Glucose, UA: NEGATIVE
LEUKOCYTES UA: NEGATIVE
Nitrite, UA: NEGATIVE
PH UA: 8.5
Protein Ur, POC: NEGATIVE
SPEC GRAV UA: 1.015
Urobilinogen, UA: 0.2

## 2016-06-13 MED ORDER — ESTRADIOL 10 MCG VA TABS: 1.0000 | ORAL_TABLET | VAGINAL | 11 refills | Status: DC

## 2016-06-13 MED ORDER — DICLOFENAC SODIUM 1 % TD GEL: 2.0000 g | Freq: Four times a day (QID) | TRANSDERMAL | 5 refills | Status: DC

## 2016-06-13 NOTE — Telephone Encounter (Signed)
Left detailed message on pt voicemail per her request.Rx sent.

## 2016-06-13 NOTE — Progress Notes (Signed)
Subjective:    Patient ID: Kristin Tapia, female    DOB: [birth year], 70 y.o.   MRN: [id number]  06/13/2016  Annual Exam   HPI This 70 y.o. female presents for Annual Wellness Examination and Routine Physical Examination and chronic medical follow-up.  Last physical:05-07-2015 Pap smear:  2016 Mammogram: 04-12-15 [facility] Colonoscopy: 2016  Bone density: 2017   Immunization History  Administered Date(s) Administered  . Hepatitis A 09/09/2012  . Hepatitis A, Adult 04/16/2013  . Hepatitis B 09/09/2012, 10/14/2012  . Hepatitis B, adult 04/16/2013  . Influenza Split 03/26/2012  . Influenza,inj,Quad PF,36+ Mos 04/16/2013, 03/16/2014, 03/11/2015  . Influenza-Unspecified 02/09/2016  . Pneumococcal Conjugate-13 05/18/2014  . Pneumococcal Polysaccharide-23 03/03/2011  . Td 07/13/2008  . Zoster 08/23/2007   BP Readings from Last 3 Encounters:  06/13/16 118/72  05/04/16 110/70  03/08/16 112/73   Wt Readings from Last 3 Encounters:  06/13/16 132 lb (59.9 kg)  05/04/16 132 lb (59.9 kg)  03/08/16 134 lb (60.8 kg)   Insomnia: wakes up every hour; usually pain is waking patient up; Dr. Oneida Alar recommended Diazepam for sleep and nerve issues.  Then developed urinary urgency; worried about Diazepam as etiology; has decreased to 1/2 dose.  Previously taking Lunesta per Dohmeier. Decreased to 1mg  qhs.  Then retired and able to not rest as well.  Rarely has problems falling asleep. Usually just wakes up and goes right back to sleep except for 4:00am.  Emotionally not as well due to insomnia; lies there for one hour.  Tries to keep eyes closed.  Physically uncomfortable.  Using Lidocaine 4% topically and also has Tylenol in the middle of the night.  One appointment with neuromuscular integration; performs deep fascia work.  Wen 7-10 days without great pain.   No previous trazodone.  No Rozerem.  Has tried Melatonin.   Took Gabapentin for tennis elbow without relief.      Osteoarthritis: Fields  decreased Meloxicam to 7.5mg  daily. Cannot tolerate pain at nightime as well.    Osteoporosis: took Fosamax for 3.5 years; was having more potential side effects; also got scared by side effects.  Walks 4-5 miles per day; also using wegihts.  Good calcium intake; vitamin d in calcium.  Increase Vitamin D both daily.  Previous rheumatology consultation.  Suggestive of fibromyalgia ; Berna Bue.    Caindidiasis: boric acid; weekly fluconazole for six months.  Has taken four weeks.  Not surprising that symptoms persisted.     Review of Systems  Constitutional: Negative for activity change, appetite change, chills, diaphoresis, fatigue, fever and unexpected weight change.  HENT: Negative for congestion, dental problem, drooling, ear discharge, ear pain, facial swelling, hearing loss, mouth sores, nosebleeds, postnasal drip, rhinorrhea, sinus pressure, sneezing, sore throat, tinnitus, trouble swallowing and voice change.   Eyes: Negative for photophobia, pain, discharge, redness, itching and visual disturbance.  Respiratory: Negative for apnea, cough, choking, chest tightness, shortness of breath, wheezing and stridor.   Cardiovascular: Negative for chest pain, palpitations and leg swelling.  Gastrointestinal: Negative for abdominal distention, abdominal pain, anal bleeding, blood in stool, constipation, diarrhea, nausea, rectal pain and vomiting.  Endocrine: Negative for cold intolerance, heat intolerance, polydipsia, polyphagia and polyuria.  Genitourinary: Negative for decreased urine volume, difficulty urinating, dyspareunia, dysuria, enuresis, flank pain, frequency, genital sores, hematuria, menstrual problem, pelvic pain, urgency, vaginal bleeding, vaginal discharge and vaginal pain.       Nocturia x 2 hours through the night; three times per night is normal.  Urge incontinence.  When sitting down on toilet, starts.    Musculoskeletal: Positive for arthralgias. Negative for back pain, gait  problem, joint swelling, myalgias, neck pain and neck stiffness.  Skin: Negative for color change, pallor, rash and wound.  Allergic/Immunologic: Negative for environmental allergies, food allergies and immunocompromised state.  Neurological: Negative for dizziness, tremors, seizures, syncope, facial asymmetry, speech difficulty, weakness, light-headedness, numbness and headaches.  Hematological: Negative for adenopathy. Does not bruise/bleed easily.  Psychiatric/Behavioral: Positive for sleep disturbance. Negative for agitation, behavioral problems, confusion, decreased concentration, dysphoric mood, hallucinations, self-injury and suicidal ideas. The patient is nervous/anxious. The patient is not hyperactive.     Past Medical History:  Diagnosis Date  . Abnormality of gait 03/11/2009  . Allergy    immunotherapy/[facility].  Marland Kitchen Anxiety   . Arthritis   . Asthma    childhood  . Cataract   . Cavus deformity of foot, acquired 03/11/2009  . Cystocele   . Detached retina   . GERD (gastroesophageal reflux disease)   . Hip pain 01/17/2013  . Metatarsalgia 03/11/2009  . Osteoporosis   . Postmenopausal atrophic vaginitis   . Rectocele   . Restless leg syndrome    L leg; s/p consultation by Dohmeier/Neurology.  . Sciatica of left side 01/17/2010  . Squamous cell carcinoma of skin 01/25/2015   L shoulder  . Thyroid disease    Past Surgical History:  Procedure Laterality Date  . APPENDECTOMY    . COLONOSCOPY  04/2009   Int & Ext Hemorrhoids, L sided diverticula.  Magod.  Repeat 5 years  . ESOPHAGOGASTRODUODENOSCOPY  04/2009   tiny HH, minimal antritis, gastric polyps. Magod.  Marland Kitchen EYE SURGERY    . MYOMECTOMY     [place].  . TONSILLECTOMY     Allergies  Allergen Reactions  . Sulfonamide Derivatives     Social History   Social History  . Marital status: Married    Spouse name: N/A  . Number of children: 2  . Years of education: N/A   Occupational History  . 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   Social History Main Topics  . Smoking status: Never Smoker  . Smokeless tobacco: Never Used  . Alcohol use No  . Drug use: No  . Sexual activity: Yes    Partners: Male    Birth control/ protection: Post-menopausal   Other Topics Concern  . Not on file   Social History Narrative   Marital status:  Married x 47 years; happily married      Children:  2 children; 1 grandchild Berline Lopes)      Lives: with husband.      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      Tobacco: never      Alcohol: none      Drugs: none      Exercise: daily.  6 miles walking daily; water aerobics three days per week warm water.  1-5-2.0 hours per day. Walks atleast 10,000 steps per day; warm water exercises three days per week.      Advanced Directives:  +Living Will; FULL CODE; no prolonged measures.       ADLs: independent with all ADLs; no assistant devices for ambulation.      Family History  Problem Relation Age of Onset  . Arthritis Mother     mild  . Hypertension Mother   . Dementia Mother   . Cancer Mother 11  breast cancer  . COPD Father   . Stroke Father   . Seizures Brother        Objective:    BP 118/72   Pulse 72   Temp 98.9 F (37.2 C) (Oral)   Resp 16   Ht 5\' 4"  (1.626 m)   Wt 132 lb (59.9 kg)   SpO2 99%   BMI 22.66 kg/m  Physical Exam  Constitutional: She is oriented to person, place, and time. She appears well-developed and well-nourished. No distress.  HENT:  Head: Normocephalic and atraumatic.  Right Ear: External ear normal.  Left Ear: External ear normal.  Nose: Nose normal.  Mouth/Throat: Oropharynx is clear and moist.  Eyes: Conjunctivae and EOM are normal. Pupils are equal, round, and reactive to light.  Neck: Normal range of motion and full passive range of motion without pain. Neck supple. No JVD present. Carotid bruit is not present. No thyromegaly present.   Cardiovascular: Normal rate, regular rhythm and normal heart sounds.  Exam reveals no gallop and no friction rub.   No murmur heard. Pulmonary/Chest: Effort normal and breath sounds normal. She has no wheezes. She has no rales. Right breast exhibits no inverted nipple, no mass, no nipple discharge, no skin change and no tenderness. Left breast exhibits no inverted nipple, no mass, no nipple discharge, no skin change and no tenderness. Breasts are symmetrical.  Abdominal: Soft. Bowel sounds are normal. She exhibits no distension and no mass. There is no tenderness. There is no rebound and no guarding.  Musculoskeletal:       Right shoulder: Normal.       Left shoulder: Normal.       Cervical back: Normal.  Lymphadenopathy:    She has no cervical adenopathy.  Neurological: She is alert and oriented to person, place, and time. She has normal reflexes. No cranial nerve deficit. She exhibits normal muscle tone. Coordination normal.  Skin: Skin is warm and dry. No rash noted. She is not diaphoretic. No erythema. No pallor.  Psychiatric: She has a normal mood and affect. Her behavior is normal. Judgment and thought content normal.  Nursing note and vitals reviewed.  Fall Risk  06/13/2016 02/16/2016 02/09/2016 05/05/2015 04/01/2015  Falls in the past year? No No No No No  Number falls in past yr: - - - - -  Risk for fall due to : - - - - -   Depression screen [facility] 2/9 06/13/2016 02/16/2016 02/09/2016 08/14/2015 05/07/2015  Decreased Interest 0 0 0 0 0  Down, Depressed, Hopeless 0 0 0 0 0  PHQ - 2 Score 0 0 0 0 0   Functional Status Survey: Is the patient deaf or have difficulty hearing?: No Does the patient have difficulty seeing, even when wearing glasses/contacts?: No Does the patient have difficulty concentrating, remembering, or making decisions?: No Does the patient have difficulty walking or climbing stairs?: No Does the patient have difficulty dressing or bathing?: No Does the patient have  difficulty doing errands alone such as visiting a doctor's office or shopping?: No      Assessment & Plan:   1. Encounter for Medicare annual wellness exam   2. Routine physical examination   3. Age-related osteoporosis without current pathological fracture   4. Pure hypercholesterolemia   5. Arthralgia, unspecified joint   6. Chronic seasonal allergic rhinitis due to pollen   7. Mild intermittent asthma without complication   8. Atrophic vaginitis   9. Psychophysiological insomnia   10. Primary osteoarthritis  involving multiple joints    -progressively worsening osteoporosis; discussed treatment options with patient at length; recommend referral to endocrinology to rule out secondary causes of osteoporosis and to also discuss treatment options in detail with patient.  Continue calcium plus D, daily weight bearing exercise. -recommend trial of Gabapentin 300mg  1-3 at bedtime for insomnia and pain. -continue Meloxicam and try to alternate with Tylenol products for chronic arthritis pain. -obtain labs to rule out underlying autoimmune process to arthralgias. -if insomnia does not improve with Neurontin, consider Trazodone.   Orders Placed This Encounter  Procedures  . CBC with Differential/Platelet  . Comprehensive metabolic panel    Order Specific Question:   Has the patient fasted?    Answer:   Yes  . Lipid panel    Order Specific Question:   Has the patient fasted?    Answer:   Yes  . TSH  . VITAMIN D 25 Hydroxy (Vit-D Deficiency, Fractures)  . Sedimentation rate  . ANA  . Rheumatoid factor  . Rheumatoid factor  . ANA  . Sedimentation rate  . Ambulatory referral to Endocrinology    Referral Priority:   Routine    Referral Type:   Consultation    Referral Reason:   Specialty Services Required    Number of Visits Requested:   1  . POCT urinalysis dipstick   Meds ordered this encounter  Medications  . MAGNESIUM GLYCINATE PLUS PO    Sig: Take 400 mg by mouth.  .  diclofenac sodium (VOLTAREN) 1 % GEL    Sig: Apply 2 g topically 4 (four) times daily.    Dispense:  100 g    Refill:  5    Return in about 3 months (around 09/11/2016) for recheck.   Corianna Avallone Elayne Guerin, M.D. [facility] [address] [place], [place]  [address] [phone] phone [phone] fax

## 2016-06-13 NOTE — Patient Instructions (Addendum)
   IF you received an x-ray today, you will receive an invoice from [facility]. Please contact [facility] at [phone] with questions or concerns regarding your invoice.   IF you received labwork today, you will receive an invoice from [facility]. Please contact [facility] at [phone] with questions or concerns regarding your invoice.   Our billing staff will not be able to assist you with questions regarding bills from these companies.  You will be contacted with the lab results as soon as they are available. The fastest way to get your results is to activate your My Chart account. Instructions are located on the last page of this paperwork. If you have not heard from us regarding the results in 2 weeks, please contact this office.     Keeping You Healthy  Get These Tests  Blood Pressure- Have your blood pressure checked by your healthcare provider at least once a year.  Normal blood pressure is 120/80.  Weight- Have your body mass index (BMI) calculated to screen for obesity.  BMI is a measure of body fat based on height and weight.  You can calculate your own BMI at www.nhlbisupport.com/bmi/  Cholesterol- Have your cholesterol checked every year.  Diabetes- Have your blood sugar checked every year if you have high blood pressure, high cholesterol, a family history of diabetes or if you are overweight.  Pap Test - Have a pap test every 1 to 5 years if you have been sexually active.  If you are older than 65 and recent pap tests have been normal you may not need additional pap tests.  In addition, if you have had a hysterectomy  for benign disease additional pap tests are not necessary.  Mammogram-Yearly mammograms are essential for early detection of breast cancer  Screening for Colon Cancer- Colonoscopy starting at age 50. Screening may begin sooner depending on your family history and other health conditions.  Follow up colonoscopy as directed by your  Gastroenterologist.  Screening for Osteoporosis- Screening begins at age 65 with bone density scanning, sooner if you are at higher risk for developing Osteoporosis.  Get these medicines  Calcium with Vitamin D- Your body requires 1200-1500 mg of Calcium a day and 800-1000 IU of Vitamin D a day.  You can only absorb 500 mg of Calcium at a time therefore Calcium must be taken in 2 or 3 separate doses throughout the day.  Hormones- Hormone therapy has been associated with increased risk for certain cancers and heart disease.  Talk to your healthcare provider about if you need relief from menopausal symptoms.  Aspirin- Ask your healthcare provider about taking Aspirin to prevent Heart Disease and Stroke.  Get these Immuniztions  Flu shot- Every fall  Pneumonia shot- Once after the age of 65; if you are younger ask your healthcare provider if you need a pneumonia shot.  Tetanus- Every ten years.  Zostavax- Once after the age of 60 to prevent shingles.  Take these steps  Don't smoke- Your healthcare provider can help you quit. For tips on how to quit, ask your healthcare provider or go to www.smokefree.gov or call 1-800 QUIT-NOW.  Be physically active- Exercise 5 days a week for a minimum of 30 minutes.  If you are not already physically active, start slow and gradually work up to 30 minutes of moderate physical activity.  Try walking, dancing, bike riding, swimming, etc.  Eat a healthy diet- Eat a variety of healthy foods such as fruits, vegetables, whole grains, low   fat milk, low fat cheeses, yogurt, lean meats, chicken, fish, eggs, dried beans, tofu, etc.  For more information go to www.thenutritionsource.org  Dental visit- Brush and floss teeth twice daily; visit your dentist twice a year.  Eye exam- Visit your Optometrist or Ophthalmologist yearly.  Drink alcohol in moderation- Limit alcohol intake to one drink or less a day.  Never drink and drive.  Depression- Your emotional  health is as important as your physical health.  If you're feeling down or losing interest in things you normally enjoy, please talk to your healthcare provider.  Seat Belts- can save your life; always wear one  Smoke/Carbon Monoxide detectors- These detectors need to be installed on the appropriate level of your home.  Replace batteries at least once a year.  Violence- If anyone is threatening or hurting you, please tell your healthcare provider.  Living Will/ Health care power of attorney- Discuss with your healthcare provider and family.  

## 2016-06-14 LAB — CBC WITH DIFFERENTIAL/PLATELET
BASOS ABS: 0 10*3/uL (ref 0.0–0.2)
Basos: 0 %
EOS (ABSOLUTE): 0.1 10*3/uL (ref 0.0–0.4)
Eos: 1 %
Hematocrit: 40.9 % (ref 34.0–46.6)
Hemoglobin: 13.6 g/dL (ref 11.1–15.9)
IMMATURE GRANS (ABS): 0 10*3/uL (ref 0.0–0.1)
Immature Granulocytes: 0 %
LYMPHS: 23 %
Lymphocytes Absolute: 1.1 10*3/uL (ref 0.7–3.1)
MCH: 32.2 pg (ref 26.6–33.0)
MCHC: 33.3 g/dL (ref 31.5–35.7)
MCV: 97 fL (ref 79–97)
MONOS ABS: 0.4 10*3/uL (ref 0.1–0.9)
Monocytes: 8 %
NEUTROS PCT: 68 %
Neutrophils Absolute: 3.3 10*3/uL (ref 1.4–7.0)
PLATELETS: 205 10*3/uL (ref 150–379)
RBC: 4.23 x10E6/uL (ref 3.77–5.28)
RDW: 13.2 % (ref 12.3–15.4)
WBC: 4.8 10*3/uL (ref 3.4–10.8)

## 2016-06-14 LAB — LIPID PANEL
CHOL/HDL RATIO: 2.5 ratio (ref 0.0–4.4)
CHOLESTEROL TOTAL: 203 mg/dL — AB (ref 100–199)
HDL: 81 mg/dL (ref >39)
LDL CALC: 107 mg/dL — AB (ref 0–99)
Triglycerides: 73 mg/dL (ref 0–149)
VLDL Cholesterol Cal: 15 mg/dL (ref 5–40)

## 2016-06-14 LAB — COMPREHENSIVE METABOLIC PANEL
ALT: 29 IU/L (ref 0–32)
AST: 29 IU/L (ref 0–40)
Albumin/Globulin Ratio: 2.2 (ref 1.2–2.2)
Albumin: 4.4 g/dL (ref 3.5–4.8)
Alkaline Phosphatase: 34 IU/L — ABNORMAL LOW (ref 39–117)
BILIRUBIN TOTAL: 0.3 mg/dL (ref 0.0–1.2)
BUN/Creatinine Ratio: 18 (ref 12–28)
BUN: 15 mg/dL (ref 8–27)
CHLORIDE: 98 mmol/L (ref 96–106)
CO2: 30 mmol/L — ABNORMAL HIGH (ref 18–29)
Calcium: 9.5 mg/dL (ref 8.7–10.3)
Creatinine, Ser: 0.85 mg/dL (ref 0.57–1.00)
GFR calc non Af Amer: 70 mL/min/{1.73_m2} (ref >59)
GFR, EST AFRICAN AMERICAN: 80 mL/min/{1.73_m2} (ref >59)
GLUCOSE: 90 mg/dL (ref 65–99)
Globulin, Total: 2 g/dL (ref 1.5–4.5)
POTASSIUM: 4.1 mmol/L (ref 3.5–5.2)
Sodium: 140 mmol/L (ref 134–144)
TOTAL PROTEIN: 6.4 g/dL (ref 6.0–8.5)

## 2016-06-14 LAB — ANA: Anti Nuclear Antibody(ANA): NEGATIVE

## 2016-06-14 LAB — SEDIMENTATION RATE: Sed Rate: 4 mm/hr (ref 0–40)

## 2016-06-14 LAB — RHEUMATOID FACTOR

## 2016-06-14 LAB — TSH: TSH: 1.58 u[IU]/mL (ref 0.450–4.500)

## 2016-06-14 LAB — VITAMIN D 25 HYDROXY (VIT D DEFICIENCY, FRACTURES): VIT D 25 HYDROXY: 39.5 ng/mL (ref 30.0–100.0)

## 2016-06-15 ENCOUNTER — Telehealth: Payer: Self-pay

## 2016-06-15 NOTE — Telephone Encounter (Signed)
PA completed on covermymeds for diclofenac gel. Pt has arthralgias in mult joints including knee, foot and ankle of left leg, and elbows. Pt has tried oral ibuprofen, mobic and gabapentin in the past. Pending.

## 2016-06-20 NOTE — Telephone Encounter (Signed)
Received fax from [facility] to complete. Denial on Covermymeds d/t "appeals not being completed on covermymeds". Completed form and faxed back.

## 2016-06-23 NOTE — Telephone Encounter (Signed)
PA approved through 06/25/17. Notified pharm.

## 2016-06-26 ENCOUNTER — Encounter: Payer: Self-pay | Admitting: Family Medicine

## 2016-06-26 DIAGNOSIS — F5104 Psychophysiologic insomnia: Secondary | ICD-10-CM | POA: Insufficient documentation

## 2016-07-04 ENCOUNTER — Encounter: Payer: Self-pay | Admitting: Family Medicine

## 2016-07-10 MED ORDER — GABAPENTIN 300 MG PO CAPS: 300.0000 mg | ORAL_CAPSULE | Freq: Three times a day (TID) | ORAL | 5 refills | Status: DC | PRN

## 2016-07-10 NOTE — Telephone Encounter (Signed)
PATIENT WOULD LIKE DR. Tamala Julian TO KNOW THAT SHE SENT HER A DETAILED MY-CHART MESSAGE ON Tuesday AND SHE HAS NOT HEARD ANYTHING BACK YET. THIS IS REGARDING THE GABAPENTIN 300 mg PRESCRIBED BY DR. Oneida Alar. SHE SAID SHE AND DR. Tamala Julian DISCUSSED IT. SHE WILL TAKE HER LAST DOSE ON Wednesday AND WOULD LIKE TO GET IT FILLED BEFORE SHE RUNS OUT. BEST PHONE [phone] (CELL)  PHARMACY CHOICE IS [facility] [address].  [place]

## 2016-07-23 ENCOUNTER — Encounter: Payer: Self-pay | Admitting: Family Medicine

## 2016-07-24 NOTE — Telephone Encounter (Signed)
Referrals, please check status of patient's referral to endocrinology; initial information sent to [facility] on 06/29/16?

## 2016-08-16 ENCOUNTER — Ambulatory Visit (INDEPENDENT_AMBULATORY_CARE_PROVIDER_SITE_OTHER): Payer: Medicare Other | Admitting: Endocrinology

## 2016-08-16 ENCOUNTER — Encounter: Payer: Self-pay | Admitting: Endocrinology

## 2016-08-16 VITALS — BP 110/70 | HR 84 | Ht 64.0 in | Wt 137.0 lb

## 2016-08-16 DIAGNOSIS — M8580 Other specified disorders of bone density and structure, unspecified site: Secondary | ICD-10-CM

## 2016-08-16 NOTE — Progress Notes (Signed)
Subjective:    Patient ID: Kristin Tapia, female    DOB: 04/16/1946, 71 y.o.   MRN: [id number]  HPI Pt is referred by Dr Tamala Julian, for osteoporosis.  Pt was noted to have osteoporosis in 2006.  She took fosamax approx 2007-2010.  Only bony fractures are right ankle and left wrist, both many years ago.  She now takes estradiol vag suppository.  She has no history of any of the following: early menopause, multiple myeloma, renal dz, hyperthyroidism, prolonged bedrest, alcoholism, smoking, liver dz, vid-d deficiency, or primary hyperparathyroidism.  She does not take heparin or anticonvulsants.  She took several short courses for asthma, many years ago.  She fears side effects of fosamax.   Past Medical History:  Diagnosis Date  . Abnormality of gait 03/11/2009  . Allergy    immunotherapy/Sharma Hugoton.  Marland Kitchen Anxiety   . Arthritis   . Asthma    childhood  . Cataract   . Cavus deformity of foot, acquired 03/11/2009  . Cystocele   . Detached retina   . GERD (gastroesophageal reflux disease)   . Hip pain 01/17/2013  . Metatarsalgia 03/11/2009  . Osteoporosis   . Postmenopausal atrophic vaginitis   . Rectocele   . Restless leg syndrome    L leg; s/p consultation by Dohmeier/Neurology.  . Sciatica of left side 01/17/2010  . Squamous cell carcinoma of skin 01/25/2015   L shoulder  . Thyroid disease     Past Surgical History:  Procedure Laterality Date  . APPENDECTOMY    . COLONOSCOPY  04/2009   Int & Ext Hemorrhoids, L sided diverticula.  Magod.  Repeat 5 years  . ESOPHAGOGASTRODUODENOSCOPY  04/2009   tiny HH, minimal antritis, gastric polyps. Magod.  Marland Kitchen EYE SURGERY    . MYOMECTOMY     [place].  . TONSILLECTOMY      Social History   Social History  . Marital status: Married    Spouse name: N/A  . Number of children: 2  . Years of education: N/A   Occupational History  . 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   Social History Main Topics  . Smoking status:  Never Smoker  . Smokeless tobacco: Never Used  . Alcohol use No  . Drug use: No  . Sexual activity: Yes    Partners: Male    Birth control/ protection: Post-menopausal   Other Topics Concern  . Not on file   Social History Narrative   Marital status:  Married x 47 years; happily married      Children:  2 children; 1 grandchild Berline Lopes)      Lives: with husband.      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      Tobacco: never      Alcohol: none      Drugs: none      Exercise: daily.  6 miles walking daily; water aerobics three days per week warm water.  1-5-2.0 hours per day. Walks atleast 10,000 steps per day; warm water exercises three days per week.      Advanced Directives:  +Living Will; FULL CODE; no prolonged measures.       ADLs: independent with all ADLs; no assistant devices for ambulation.       Current Outpatient Prescriptions on File Prior to Visit  Medication Sig Dispense Refill  . acetaminophen (TYLENOL) 325 MG tablet Take 325 mg by mouth every 6 (  six) hours as needed.    . Calcium Carbonate-Vitamin D (CALCIUM 600+D3) 600-400 MG-UNIT per tablet Take 1 tablet by mouth 3 (three) times a week.    . cholecalciferol (VITAMIN D) 400 UNITS TABS Take 2,000 Units by mouth daily.     . cycloSPORINE (RESTASIS) 0.05 % ophthalmic emulsion Place 1 drop into both eyes once.     . diclofenac sodium (VOLTAREN) 1 % GEL Apply 2 g topically 4 (four) times daily. 100 g 5  . fexofenadine (ALLEGRA) 180 MG tablet Take 180 mg by mouth every morning.    . fluconazole (DIFLUCAN) 150 MG tablet Take 1 tablet (150 mg total) by mouth once a week. 12 tablet 1  . gabapentin (NEURONTIN) 300 MG capsule Take 1-2 capsules (300-600 mg total) by mouth 3 (three) times daily as needed. 90 capsule 5  . ipratropium (ATROVENT) 0.03 % nasal spray Place 2 sprays into the nose 3 (three) times daily.    Marland Kitchen MAGNESIUM GLYCINATE PLUS PO Take 400 mg by mouth.     . meloxicam (MOBIC) 7.5 MG tablet Take 1 tablet (7.5 mg total) by mouth 2 (two) times daily. (Patient taking differently: Take 7.5 mg by mouth once. ) 60 tablet 6  . Omega-3 Fatty Acids (FISH OIL) 1200 MG CAPS Take 1,000 mg by mouth daily. Patient takes 2 gel caps daily    . XOPENEX HFA 45 MCG/ACT inhaler     . Estradiol 10 MCG TABS vaginal tablet Place 1 tablet (10 mcg total) vaginally 2 (two) times a week. (Patient not taking: Reported on 08/16/2016) 8 tablet 11   No current facility-administered medications on file prior to visit.     Allergies  Allergen Reactions  . Sulfonamide Derivatives     Family History  Problem Relation Age of Onset  . Arthritis Mother     mild  . Hypertension Mother   . Dementia Mother   . Cancer Mother 49    breast cancer  . COPD Father   . Stroke Father   . Seizures Brother   . Osteoporosis Neg Hx     BP 110/70   Pulse 84   Ht 5' 4" (1.626 m)   Wt 137 lb (62.1 kg)   SpO2 98%   BMI 23.52 kg/m   Review of Systems denies weight loss, heartburn, edema, skin rash, falls, cramps, memory loss, back pain, and easy bruising.  She had neg w/u for hematuria. She has cold intolerance, rhinorrhea, and insomnia.     Objective:   Physical Exam VS: see vs page GEN: no distress HEAD: head: no deformity eyes: no periorbital swelling, no proptosis external nose and ears are normal mouth: no lesion seen NECK: supple, thyroid is not enlarged CHEST WALL: no deformity.  No kyphosis.   LUNGS: clear to auscultation. CV: reg rate and rhythm, no murmur. ABD: abdomen is soft, nontender.  no hepatosplenomegaly.  not distended.  no hernia MUSCULOSKELETAL: muscle bulk and strength are grossly normal.  no obvious joint swelling.  gait is normal and steady EXTEMITIES: no deformity.  no ulcer on the feet.  feet are of normal color and temp.  no edema PULSES: dorsalis pedis intact bilat.  no carotid bruit NEURO:  cn 2-12 grossly intact.   readily moves all 4's.   sensation is intact to touch on the feet SKIN:  Normal texture and temperature.  No rash or suspicious lesion is visible.   NODES:  None palpable at the neck.   PSYCH: alert, well-oriented.  Does not appear anxious nor depressed.    Our office has requested records of 2017 DEXA result.    25-OH vit-D=39  Lab Results  Component Value Date   TSH 1.580 06/13/2016      Assessment & Plan:  Osteoporosis, new to me.  She should resume fosamax.    Patient is advised the following: Patient Instructions  It is critically important to prevent falling down (keep floor areas well-lit, dry, and free of loose objects.  If you have a cane, walker, or wheelchair, you should use it, even for short trips around the house.  Wear flat-soled shoes.  Also, try not to rush).   Take calcium 1200 mg per day, and vitamin-D, 400 units per day.   Please consider resuming the fosamax, and let us know if you decide to take.

## 2016-08-16 NOTE — Patient Instructions (Addendum)
It is critically important to prevent falling down (keep floor areas well-lit, dry, and free of loose objects.  If you have a cane, walker, or wheelchair, you should use it, even for short trips around the house.  Wear flat-soled shoes.  Also, try not to rush).   Take calcium 1200 mg per day, and vitamin-D, 400 units per day.   Please consider resuming the fosamax, and let us know if you decide to take.

## 2016-08-23 ENCOUNTER — Encounter: Payer: Self-pay | Admitting: Family Medicine

## 2016-08-23 ENCOUNTER — Ambulatory Visit (INDEPENDENT_AMBULATORY_CARE_PROVIDER_SITE_OTHER): Payer: Medicare Other | Admitting: Family Medicine

## 2016-08-23 VITALS — BP 105/60 | HR 65 | Temp 98.7°F | Resp 16 | Ht 64.0 in | Wt 138.4 lb

## 2016-08-23 DIAGNOSIS — N3941 Urge incontinence: Secondary | ICD-10-CM

## 2016-08-23 DIAGNOSIS — M150 Primary generalized (osteo)arthritis: Secondary | ICD-10-CM

## 2016-08-23 DIAGNOSIS — M159 Polyosteoarthritis, unspecified: Secondary | ICD-10-CM

## 2016-08-23 DIAGNOSIS — F5104 Psychophysiologic insomnia: Secondary | ICD-10-CM | POA: Diagnosis not present

## 2016-08-23 DIAGNOSIS — M2550 Pain in unspecified joint: Secondary | ICD-10-CM | POA: Diagnosis not present

## 2016-08-23 DIAGNOSIS — M8589 Other specified disorders of bone density and structure, multiple sites: Secondary | ICD-10-CM

## 2016-08-23 MED ORDER — DICLOFENAC SODIUM 1 % TD GEL: 2.0000 g | Freq: Four times a day (QID) | TRANSDERMAL | 5 refills | Status: DC

## 2016-08-23 NOTE — Progress Notes (Signed)
Subjective:    Patient ID: Kristin Tapia, female    DOB: July 04, 1945, 71 y.o.   MRN: [id number]  08/23/2016  Follow-up (medication and referrals from last visit)   HPI This 71 y.o. female presents for evaluation of annual wellness exam on 05/2016.  Osteoarthritis: Gabapentin does take care of the pain; after six nights of taking Gabapentin, however develops insomnia.  Tried to take 2 at night which causes drowsiness the following day. Has been alternating Diazepam and Gabapentin for four nights with good results.  Diazepam increases urinary urgency.  Also participating in Dynamic reintegration at [facility].  Works with fascia.  Twice per month if lucky. Helping with osteoarthritis pain.   Kristin Tapia.    Osteoporosis: s/p endocrinology consultation regarding osteoporosis.  Dr. Loanne Tapia recommended restarting bisphosphonate due to history of fracture.  Pt slipped on ice; very slight break.  Other fall, broke wrist during dance class.  Spin until get dizzy and ran. Reluctant to take medication. Dr. Loanne Tapia stated that significant variation in bone density results is likely due to lack of reliability of testing.    Meloxican and Voltaren gel as needed for chronic arthralgias.    Review of Systems  Constitutional: Negative for chills, diaphoresis, fatigue and fever.  Eyes: Negative for visual disturbance.  Respiratory: Negative for cough and shortness of breath.   Cardiovascular: Negative for chest pain, palpitations and leg swelling.  Gastrointestinal: Negative for abdominal pain, constipation, diarrhea, nausea and vomiting.  Endocrine: Negative for cold intolerance, heat intolerance, polydipsia, polyphagia and polyuria.  Musculoskeletal: Positive for arthralgias, back pain, joint swelling and myalgias.  Neurological: Negative for dizziness, tremors, seizures, syncope, facial asymmetry, speech difficulty, weakness, light-headedness, numbness and headaches.    Psychiatric/Behavioral: Positive for sleep disturbance. Negative for dysphoric mood, self-injury and suicidal ideas. The patient is nervous/anxious.     Past Medical History:  Diagnosis Date  . Abnormality of gait 03/11/2009  . Allergy    immunotherapy/Kristin Tapia.  Marland Kitchen Anxiety   . Arthritis   . Asthma    childhood  . Cataract   . Cavus deformity of foot, acquired 03/11/2009  . Cystocele   . Detached retina   . GERD (gastroesophageal reflux disease)   . Hip pain 01/17/2013  . Metatarsalgia 03/11/2009  . Osteoporosis   . Postmenopausal atrophic vaginitis   . Rectocele   . Restless leg syndrome    L leg; s/p consultation by Kristin Tapia/Neurology.  . Sciatica of left side 01/17/2010  . Squamous cell carcinoma of skin 01/25/2015   L shoulder  . Thyroid disease    Past Surgical History:  Procedure Laterality Date  . APPENDECTOMY    . COLONOSCOPY  04/2009   Int & Ext Hemorrhoids, L sided diverticula.  Magod.  Repeat 5 years  . ESOPHAGOGASTRODUODENOSCOPY  04/2009   tiny HH, minimal antritis, gastric polyps. Magod.  Marland Kitchen EYE SURGERY    . MYOMECTOMY     Kristin Tapia.  . TONSILLECTOMY     Allergies  Allergen Reactions  . Sulfonamide Derivatives    Current Outpatient Prescriptions  Medication Sig Dispense Refill  . acetaminophen (TYLENOL) 325 MG tablet Take 325 mg by mouth every 6 (six) hours as needed.    . Calcium Carbonate-Vitamin D (CALCIUM 600+D3) 600-400 MG-UNIT per tablet Take 1 tablet by mouth daily.     . cholecalciferol (VITAMIN D) 400 UNITS TABS Take 2,000 Units by mouth daily.     . cycloSPORINE (RESTASIS) 0.05 % ophthalmic emulsion Place 1 drop into both eyes  once.     Marland Kitchen DIAZEPAM PO Take 1 mg by mouth.    . diclofenac sodium (VOLTAREN) 1 % GEL Apply 2 g topically 4 (four) times daily. 200 g 5  . Estradiol 10 MCG TABS vaginal tablet Place 1 tablet (10 mcg total) vaginally 2 (two) times a week. 8 tablet 11  . fexofenadine (ALLEGRA) 180 MG tablet Take 180 mg by mouth  every morning.    . fluconazole (DIFLUCAN) 150 MG tablet Take 1 tablet (150 mg total) by mouth once a week. 12 tablet 1  . gabapentin (NEURONTIN) 300 MG capsule Take 1-2 capsules (300-600 mg total) by mouth 3 (three) times daily as needed. (Patient taking differently: Take 300 mg by mouth once. 6 night on, 4 off) 90 capsule 5  . ipratropium (ATROVENT) 0.03 % nasal spray Place 2 sprays into the nose 3 (three) times daily.    Marland Kitchen lidocaine (LMX) 4 % cream Apply 1 application topically as needed.    Marland Kitchen MAGNESIUM GLYCINATE PLUS PO Take 400 mg by mouth.    . meloxicam (MOBIC) 7.5 MG tablet Take 1 tablet (7.5 mg total) by mouth 2 (two) times daily. (Patient taking differently: Take 7.5 mg by mouth as needed. ) 60 tablet 6  . Omega-3 Fatty Acids (FISH OIL) 1200 MG CAPS Take 1,200 mg by mouth daily. Patient takes 2 gel caps daily    . XOPENEX HFA 45 MCG/ACT inhaler      No current facility-administered medications for this visit.    Social History   Social History  . Marital status: Married    Spouse name: N/A  . Number of children: 2  . Years of education: N/A   Occupational History  . 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   Social History Main Topics  . Smoking status: Never Smoker  . Smokeless tobacco: Never Used  . Alcohol use No  . Drug use: No  . Sexual activity: Yes    Partners: Male    Birth control/ protection: Post-menopausal   Other Topics Concern  . Not on file   Social History Narrative   Marital status:  Married x 47 years; happily married      Children:  2 children; 1 grandchild Kristin Tapia)      Lives: with husband.      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      Tobacco: never      Alcohol: none      Drugs: none      Exercise: daily.  6 miles walking daily; water aerobics three days per week warm water.  1-5-2.0 hours per day. Walks atleast 10,000 steps per day; warm water exercises three days  per week.      Advanced Directives:  +Living Will; FULL CODE; no prolonged measures.       ADLs: independent with all ADLs; no assistant devices for ambulation.      Family History  Problem Relation Age of Onset  . Arthritis Mother     mild  . Hypertension Mother   . Dementia Mother   . Cancer Mother 10    breast cancer  . COPD Father   . Stroke Father   . Seizures Brother   . Osteoporosis Neg Hx        Objective:    BP 105/60   Pulse 65   Temp 98.7 F (37.1 C) (Oral)   Resp 16  Ht 5\' 4"  (1.626 m)   Wt 138 lb 6.4 oz (62.8 kg)   SpO2 98%   BMI 23.76 kg/m  Physical Exam  Constitutional: She is oriented to person, place, and time. She appears well-developed and well-nourished. No distress.  HENT:  Head: Normocephalic and atraumatic.  Right Ear: External ear normal.  Left Ear: External ear normal.  Nose: Nose normal.  Mouth/Throat: Oropharynx is clear and moist.  Eyes: Conjunctivae and EOM are normal. Pupils are equal, round, and reactive to light.  Neck: Normal range of motion. Neck supple. Carotid bruit is not present. No thyromegaly present.  Cardiovascular: Normal rate, regular rhythm, normal heart sounds and intact distal pulses.  Exam reveals no gallop and no friction rub.   No murmur heard. Pulmonary/Chest: Effort normal and breath sounds normal. She has no wheezes. She has no rales.  Abdominal: Soft. Bowel sounds are normal. She exhibits no distension and no mass. There is no tenderness. There is no rebound and no guarding.  Lymphadenopathy:    She has no cervical adenopathy.  Neurological: She is alert and oriented to person, place, and time. No cranial nerve deficit.  Skin: Skin is warm and dry. No rash noted. She is not diaphoretic. No erythema. No pallor.  Psychiatric: She has a normal mood and affect. Her behavior is normal.   Results for orders placed or performed in visit on 08/03/16  HM DEXA SCAN  Result Value Ref Range   HM Dexa Scan R femur T  -2.60 osteoporosis        Assessment & Plan:   1. Urge incontinence   2. Arthralgia, unspecified joint   3. Primary osteoarthritis involving multiple joints   4. Psychophysiological insomnia   5. Osteopenia of multiple sites    -agreeable to referral to physical therapy for urinary incontinence -s/p endocrinology consultation; pt does not desire to start bisphosphonate at this time; support decision to proceed with daily exercise, calcium supplementation, vitamin D supplementation,and repeat bone density scan in two years. If bone density continues to decline at that time, will warrant treatment. -insomnia improved with Gabapentin yet must alternate with Valium. -alternating Diclofenac gel with Meloxicam for arthritis pain; also undergoing physical therapy and followed closely by sports medicine.   Orders Placed This Encounter  Procedures  . Ambulatory referral to Physical Therapy    Referral Priority:   Routine    Referral Type:   Physical Medicine    Referral Reason:   Specialty Services Required    Requested Specialty:   Physical Therapy    Number of Visits Requested:   1   Meds ordered this encounter  Medications  . diclofenac sodium (VOLTAREN) 1 % GEL    Sig: Apply 2 g topically 4 (four) times daily.    Dispense:  200 g    Refill:  5    No Follow-up on file.   Semiah Konczal Elayne Guerin, M.D. Primary Care at [facility] [place] [address] [place], [place]  [phone] phone [phone] fax

## 2016-08-23 NOTE — Patient Instructions (Signed)
     IF you received an x-ray today, you will receive an invoice from [facility]. Please contact [facility] at [phone] with questions or concerns regarding your invoice.   IF you received labwork today, you will receive an invoice from [facility]. Please contact [facility] at [phone] with questions or concerns regarding your invoice.   Our billing staff will not be able to assist you with questions regarding bills from these companies.  You will be contacted with the lab results as soon as they are available. The fastest way to get your results is to activate your My Chart account. Instructions are located on the last page of this paperwork. If you have not heard from us regarding the results in 2 weeks, please contact this office.     

## 2016-08-24 ENCOUNTER — Ambulatory Visit
Admission: RE | Admit: 2016-08-24 | Discharge: 2016-08-24 | Disposition: A | Discharge status: Home / Self Care | Payer: Medicare Other | Source: Ambulatory Visit | Attending: Sports Medicine | Admitting: Sports Medicine

## 2016-08-24 ENCOUNTER — Encounter: Payer: Self-pay | Admitting: Sports Medicine

## 2016-08-24 ENCOUNTER — Ambulatory Visit (INDEPENDENT_AMBULATORY_CARE_PROVIDER_SITE_OTHER): Payer: Medicare Other | Admitting: Sports Medicine

## 2016-08-24 VITALS — BP 103/65 | HR 72 | Ht 65.0 in | Wt 132.0 lb

## 2016-08-24 DIAGNOSIS — M545 Low back pain: Secondary | ICD-10-CM

## 2016-08-24 DIAGNOSIS — M5432 Sciatica, left side: Secondary | ICD-10-CM

## 2016-08-24 DIAGNOSIS — G8929 Other chronic pain: Secondary | ICD-10-CM

## 2016-08-24 DIAGNOSIS — M7711 Lateral epicondylitis, right elbow: Secondary | ICD-10-CM

## 2016-08-24 DIAGNOSIS — M25552 Pain in left hip: Secondary | ICD-10-CM | POA: Diagnosis not present

## 2016-08-24 NOTE — Assessment & Plan Note (Signed)
This is likely related to her findings of grade 2 spondylolithesis with L5/S1 disk loss  Will cont her gabapentin and diazepam as tolerated Start flexion exercise series

## 2016-08-24 NOTE — Assessment & Plan Note (Addendum)
Patient with left hip pain.Felt buckling sound on standing from sitting position about a month ago which is concerning for labral tear. However, she has full range of motion in her hip. Positive FABER and tenderness of SI joint concerning for SI joint arthropathy. She has a significant step off in her back at L5 that looks like a tubercle with bending forward concerning for spondylolisthesis. Wondering if this is contributing to her pain. Radiation of the pain is along the L5 and S1 distribution.   Recommended exercise regimen to strengthen her abductor muscles.   Can continue alternating gabapentin and diazepam as needed for pain. It is odd that she has insomnia with gabapentin   Obtained an x-ray of lumbar spine for steps off.   XRay reveals grade 2 spondylolithesis of L5/S1 and this mackes hip pain likely referred  We will work to control sxs and strengthen weak mm

## 2016-08-24 NOTE — Progress Notes (Signed)
Subjective:    Kristin Tapia is a 71 y.o. old female here left hip pain  HPI Left hip pain: Patient with  history listed below. She reports feeling a buckling sound in her left hip when she stood up from sitting position about a month ago that scared her. She denies further buckling sound. However, she reports pain in right hip with climbing stairs. She have history of left leg sciatica. She was seen here about 3 months ago. She was started on diazepam 2 mg at bedtime. She took diazepam for 2 weeks and had urinary issues. She went to PCP who gave her gabapentin. Gabapentin worked well but caused insomnia after a week. Then she started alternating gabapentin and diazepam.  Now she reports having a lot of interrupted sleep due to pain in the back. She reports radiation of her pain along the lateral aspect of her leg all the way to her foot. When asked where she hurts the most, she points over the sacroiliac joint, piriformis and fibular head.  Patient is planning a trip in about a month and half. planning to hike.. She is worried because she hasn't done arm strengthening exercises in a while due to her right tennis elbow.   PMH/Problem List: has Sciatica of left side; Enthesopathy of ankle and tarsus; CAVUS DEFORMITY OF FOOT, ACQUIRED; ABNORMALITY OF GAIT; Left hip pain; Pain in joint, ankle and foot; Right tennis elbow; Primary osteoarthritis involving multiple joints; Osteopenia; Tendinopathy of right rotator cuff; Right hamstring muscle strain; Seasonal allergic rhinitis due to pollen; Asthma, mild intermittent; Atrophic vaginitis; Vaginal moniliasis; and Psychophysiological insomnia on her problem list.   has a past medical history of Abnormality of gait (03/11/2009); Allergy; Anxiety; Arthritis; Asthma; Cataract; Cavus deformity of foot, acquired (03/11/2009); Cystocele; Detached retina; GERD (gastroesophageal reflux disease); Hip pain (01/17/2013); Metatarsalgia (03/11/2009); Osteoporosis; Postmenopausal  atrophic vaginitis; Rectocele; Restless leg syndrome; Sciatica of left side (01/17/2010); Squamous cell carcinoma of skin (01/25/2015); and Thyroid disease.  FH:  Family History  Problem Relation Age of Onset  . Arthritis Mother     mild  . Hypertension Mother   . Dementia Mother   . Cancer Mother 27    breast cancer  . COPD Father   . Stroke Father   . Seizures Brother   . Osteoporosis Neg Hx     SH Social History  Substance Use Topics  . Smoking status: Never Smoker  . Smokeless tobacco: Never Used  . Alcohol use No    Review of Systems Review of systems negative except for pertinent positives and negatives in history of present illness above.     Objective:     Vitals:   08/24/16 1506  BP: 103/65  Pulse: 72  Weight: 132 lb (59.9 kg)  Height: 5\' 5"  (1.651 m)    Physical Exam GEN: appears well, no apparent distress. CVS: 2+ DP & PT pulses bilaterally, cap refills < 2 secs RESP: speaks in full sentence, no IWOB MSK and Neuro:  Back: noted a significant step off in her back at L5 that looks like a tubercle with bending forward. It is not mobile or tender to palpation. No tenderness to palpation over spines or paraspinal muscles. Limited extension and side to side flexion 2/2 pain.   Hip: tenderness to palpation over left sacroiliac joint, piriformis and fibular head, no tenderness to palpation over trochanters,  post FABER on the left, no FADIR, weakness with abduction in left hip compared to right.  Motor 5/5 in ankle, knee  and hip except for abduction in left hip, light sensation intact in all dermatomes, patellar and plantar reflexes 2+ bilaterally.  SKIN: no apparent skin lesion PSYCH: euthymic mood with congruent affect    Assessment and Plan:  Left hip pain Patient with left hip pain.Felt buckling sound on standing from sitting position about a month ago which is concerning for labral tear. However, she has full range of motion. Positive FABER and tenderness of  SI joint concerning for SI joint arthropathy. She has a significant step off in her back at L5 that looks like a tubercle with bending forward concerning for spondylolisthesis. Wondering if this is contributing to her pain. Radiation of the pain is along the L5 and S1 distribution.   Recommended exercise regimen to strengthen her abductor muscles.   Can continue alternating gabapentin and diazepam as needed for pain. It is odd that she has insomnia with gabapentin   Obtained an x-ray of lumbar spine for steps off.    Right tennis elbow Recommended light home exercise regimen  Orders Placed This Encounter  Procedures  . DG Lumbar Spine 2-3 Views    Standing Status:   Future    Number of Occurrences:   1    Standing Expiration Date:   10/24/2017    Scheduling Instructions:     AP, LATERAL. ADDITIONAL VIEW OF BONE SPUR IF NEEDED.    Order Specific Question:   Reason for Exam (SYMPTOM  OR DIAGNOSIS REQUIRED)    Answer:   BACK PAIN    Order Specific Question:   Preferred imaging location?    Answer:   [facility]   Mercy Riding, MD 08/24/16 Pager: [phone]

## 2016-08-24 NOTE — Assessment & Plan Note (Signed)
Recommended light home exercise regimen

## 2016-09-18 ENCOUNTER — Other Ambulatory Visit: Payer: Self-pay | Admitting: Family Medicine

## 2016-09-18 DIAGNOSIS — M4317 Spondylolisthesis, lumbosacral region: Secondary | ICD-10-CM

## 2016-09-18 DIAGNOSIS — M5432 Sciatica, left side: Secondary | ICD-10-CM

## 2016-09-19 NOTE — Addendum Note (Signed)
Addended by: Wardell Honour on: 09/19/2016 11:24 AM   Modules accepted: Orders

## 2016-09-26 ENCOUNTER — Ambulatory Visit
Admission: RE | Admit: 2016-09-26 | Discharge: 2016-09-26 | Disposition: A | Discharge status: Home / Self Care | Payer: Medicare Other | Source: Ambulatory Visit | Attending: Family Medicine | Admitting: Family Medicine

## 2016-09-26 DIAGNOSIS — M5432 Sciatica, left side: Secondary | ICD-10-CM

## 2016-09-26 DIAGNOSIS — M4317 Spondylolisthesis, lumbosacral region: Secondary | ICD-10-CM

## 2016-11-02 ENCOUNTER — Ambulatory Visit (INDEPENDENT_AMBULATORY_CARE_PROVIDER_SITE_OTHER): Payer: Medicare Other | Admitting: Sports Medicine

## 2016-11-02 DIAGNOSIS — M5432 Sciatica, left side: Secondary | ICD-10-CM

## 2016-11-02 DIAGNOSIS — M5136 Other intervertebral disc degeneration, lumbar region: Secondary | ICD-10-CM | POA: Diagnosis not present

## 2016-11-02 NOTE — Progress Notes (Signed)
CC; LBP  Patient noted to have spondylolithesis LBP limiting and since my last visit with her had difficulty walking and increased pain Saw Dr Lynann Bologna (spine ortho) and he reviewed MRI This shows foraminal stenosis/ DDD and spondylolithesis Scheduled to try CSI of spine by DR Jacelyn Grip next week  Has stopped doing back extension Walking but no back exercise routine  ROS ? occassional sciatica w radiation of pain to lateral calf and into foot Fequentt radiation into left hip  PE Thin F in NAD BP 117/71   Ht 5' 4.5" (1.638 m)   Wt 132 lb (59.9 kg)   BMI 22.31 kg/m   Pain w back extension Good back flexion SLR neg Able to do situp Able to do crunches with modifications  Walking gait is normal

## 2016-11-02 NOTE — Assessment & Plan Note (Signed)
Start on series of abdominal exercises w crunches and situps Keep up walking and HEP Avoid back extension  I will follow up once she has tried injections

## 2016-11-02 NOTE — Assessment & Plan Note (Signed)
Cont on gabapentin  Voltaren seems to help  Holding until she tries CSI

## 2016-11-08 ENCOUNTER — Encounter: Payer: Self-pay | Admitting: Gynecology

## 2016-12-20 ENCOUNTER — Encounter: Payer: Self-pay | Admitting: Gynecology

## 2016-12-20 ENCOUNTER — Ambulatory Visit (INDEPENDENT_AMBULATORY_CARE_PROVIDER_SITE_OTHER): Payer: Medicare Other | Admitting: Gynecology

## 2016-12-20 VITALS — BP 110/62

## 2016-12-20 DIAGNOSIS — N760 Acute vaginitis: Secondary | ICD-10-CM

## 2016-12-20 DIAGNOSIS — L298 Other pruritus: Secondary | ICD-10-CM

## 2016-12-20 DIAGNOSIS — N898 Other specified noninflammatory disorders of vagina: Secondary | ICD-10-CM

## 2016-12-20 DIAGNOSIS — B373 Candidiasis of vulva and vagina: Secondary | ICD-10-CM

## 2016-12-20 DIAGNOSIS — B3731 Acute candidiasis of vulva and vagina: Secondary | ICD-10-CM

## 2016-12-20 DIAGNOSIS — B9689 Other specified bacterial agents as the cause of diseases classified elsewhere: Secondary | ICD-10-CM

## 2016-12-20 LAB — WET PREP FOR TRICH, YEAST, CLUE: TRICH WET PREP: NONE SEEN

## 2016-12-20 MED ORDER — MICONAZOLE NITRATE 2 % EX CREA: TOPICAL_CREAM | CUTANEOUS | 6 refills | Status: DC

## 2016-12-20 MED ORDER — TERCONAZOLE 0.4 % VA CREA: TOPICAL_CREAM | VAGINAL | 6 refills | Status: DC

## 2016-12-20 MED ORDER — METRONIDAZOLE 500 MG PO TABS: 500.0000 mg | ORAL_TABLET | Freq: Two times a day (BID) | ORAL | 0 refills | Status: DC

## 2016-12-20 NOTE — Addendum Note (Signed)
Addended by: Burnett Kanaris on: 12/20/2016 09:54 AM   Modules accepted: Orders

## 2016-12-20 NOTE — Progress Notes (Signed)
   Patient is a 71 year old that presented to the office today complaining of vulvar pruritus and vaginal discharge. Review of her record indicated in 6 months ago she was referred to her practice from her PCP is a result of her recurrent yeast vaginitis. We had done a vaginal culture for yeast species specific and Candida albicans was identified. She was started on boric acid suppository 6 mg daily at bedtime for 2 weeks and then she was started on Diflucan 150 mg once a week for 6 months. When she stopped approximately 2 weeks ago her symptoms restarted. She denies any history of blood transfusion or any STD she is on Lanoxin relationship she denies any diabetes. Her PCP did her blood work December 2017 and she had a normal blood sugar. She is sexually active and uses lubricants but no condom. She denied any vaginal bleeding.  Exam: External genitalia slightly irritated but no gross lesions on inspection Speculum exam vagina no gross lesions on inspection Cervix: No gross lesions on inspection Bimanual exam not done Rectal exam: Not done  Wet prep moderate yeast, few clue cell, few white blood cell and many bacteria  Assessment/plan: Patient with recurrent yeast infection with BV component. We discussed different treatment options for 6 more months M decided to proceed with the following: For her bacterial vaginosis she'll be started on Flagyl 500 mg twice a day for 7 days. We'll also start her on Terazol vaginal antifungal cream to apply inside her vagina daily at bedtime for one week then weekly. Since she had been on Diflucan for 6 months which checking an SGOT and SGPT today. Because of recurrent yeast infection were going to check also a hemoglobin A1c. Patient when she finishes the weekly regimen of the Terazol she can continue to use vaginal estrogen twice a week inside the vagina and the external genitalia and 1 day week using antifungal cream.

## 2016-12-20 NOTE — Patient Instructions (Addendum)
Vaginal Yeast infection, Adult Vaginal yeast infection is a condition that causes soreness, swelling, and redness (inflammation) of the vagina. It also causes vaginal discharge. This is a common condition. Some women get this infection frequently. What are the causes? This condition is caused by a change in the normal balance of the yeast (candida) and bacteria that live in the vagina. This change causes an overgrowth of yeast, which causes the inflammation. What increases the risk? This condition is more likely to develop in:  Women who take antibiotic medicines.  Women who have diabetes.  Women who take birth control pills.  Women who are pregnant.  Women who douche often.  Women who have a weak defense (immune) system.  Women who have been taking steroid medicines for a long time.  Women who frequently wear tight clothing.  What are the signs or symptoms? Symptoms of this condition include:  White, thick vaginal discharge.  Swelling, itching, redness, and irritation of the vagina. The lips of the vagina (vulva) may be affected as well.  Pain or a burning feeling while urinating.  Pain during sex.  How is this diagnosed? This condition is diagnosed with a medical history and physical exam. This will include a pelvic exam. Your health care provider will examine a sample of your vaginal discharge under a microscope. Your health care provider may send this sample for testing to confirm the diagnosis. How is this treated? This condition is treated with medicine. Medicines may be over-the-counter or prescription. You may be told to use one or more of the following:  Medicine that is taken orally.  Medicine that is applied as a cream.  Medicine that is inserted directly into the vagina (suppository).  Follow these instructions at home:  Take or apply over-the-counter and prescription medicines only as told by your health care provider.  Do not have sex until your health  care provider has approved. Tell your sex partner that you have a yeast infection. That person should go to his or her health care provider if he or she develops symptoms.  Do not wear tight clothes, such as pantyhose or tight pants.  Avoid using tampons until your health care provider approves.  Eat more yogurt. This may help to keep your yeast infection from returning.  Try taking a sitz bath to help with discomfort. This is a warm water bath that is taken while you are sitting down. The water should only come up to your hips and should cover your buttocks. Do this 3-4 times per day or as told by your health care provider.  Do not douche.  Wear breathable, cotton underwear.  If you have diabetes, keep your blood sugar levels under control. Contact a health care provider if:  You have a fever.  Your symptoms go away and then return.  Your symptoms do not get better with treatment.  Your symptoms get worse.  You have new symptoms.  You develop blisters in or around your vagina.  You have blood coming from your vagina and it is not your menstrual period.  You develop pain in your abdomen. This information is not intended to replace advice given to you by your health care provider. Make sure you discuss any questions you have with your health care provider. Document Released: 03/22/2005 Document Revised: 11/24/2015 Document Reviewed: 12/14/2014 Elsevier Interactive Patient Education  2018 [employer]. Bacterial Vaginosis Bacterial vaginosis is a vaginal infection that occurs when the normal balance of bacteria in the vagina is disrupted.   It results from an overgrowth of certain bacteria. This is the most common vaginal infection among women ages 75-44. Because bacterial vaginosis increases your risk for STIs (sexually transmitted infections), getting treated can help reduce your risk for chlamydia, gonorrhea, herpes, and HIV (human immunodeficiency virus). Treatment is also  important for preventing complications in pregnant women, because this condition can cause an early (premature) delivery. What are the causes? This condition is caused by an increase in harmful bacteria that are normally present in small amounts in the vagina. However, the reason that the condition develops is not fully understood. What increases the risk? The following factors may make you more likely to develop this condition:  Having a new sexual partner or multiple sexual partners.  Having unprotected sex.  Douching.  Having an intrauterine device (IUD).  Smoking.  Drug and alcohol abuse.  Taking certain antibiotic medicines.  Being pregnant.  You cannot get bacterial vaginosis from toilet seats, bedding, swimming pools, or contact with objects around you. What are the signs or symptoms? Symptoms of this condition include:  Grey or white vaginal discharge. The discharge can also be watery or foamy.  A fish-like odor with discharge, especially after sexual intercourse or during menstruation.  Itching in and around the vagina.  Burning or pain with urination.  Some women with bacterial vaginosis have no signs or symptoms. How is this diagnosed? This condition is diagnosed based on:  Your medical history.  A physical exam of the vagina.  Testing a sample of vaginal fluid under a microscope to look for a large amount of bad bacteria or abnormal cells. Your health care provider may use a cotton swab or a small wooden spatula to collect the sample.  How is this treated? This condition is treated with antibiotics. These may be given as a pill, a vaginal cream, or a medicine that is put into the vagina (suppository). If the condition comes back after treatment, a second round of antibiotics may be needed. Follow these instructions at home: Medicines  Take over-the-counter and prescription medicines only as told by your health care provider.  Take or use your antibiotic  as told by your health care provider. Do not stop taking or using the antibiotic even if you start to feel better. General instructions  If you have a female sexual partner, tell her that you have a vaginal infection. She should see her health care provider and be treated if she has symptoms. If you have a female sexual partner, he does not need treatment.  During treatment: ? Avoid sexual activity until you finish treatment. ? Do not douche. ? Avoid alcohol as directed by your health care provider. ? Avoid breastfeeding as directed by your health care provider.  Drink enough water and fluids to keep your urine clear or pale yellow.  Keep the area around your vagina and rectum clean. ? Wash the area daily with warm water. ? Wipe yourself from front to back after using the toilet.  Keep all follow-up visits as told by your health care provider. This is important. How is this prevented?  Do not douche.  Wash the outside of your vagina with warm water only.  Use protection when having sex. This includes latex condoms and dental dams.  Limit how many sexual partners you have. To help prevent bacterial vaginosis, it is best to have sex with just one partner (monogamous).  Make sure you and your sexual partner are tested for STIs.  Wear cotton or cotton-lined  underwear.  Avoid wearing tight pants and pantyhose, especially during summer.  Limit the amount of alcohol that you drink.  Do not use any products that contain nicotine or tobacco, such as cigarettes and e-cigarettes. If you need help quitting, ask your health care provider.  Do not use illegal drugs. Where to find more information:  Centers for Disease Control and Prevention: AppraiserFraud.fi  American Sexual Health Association (ASHA): www.ashastd.org  [place] Department of Health and Financial controller, Office on Women's Health: DustingSprays.pl or SecuritiesCard.it Contact a  health care provider if:  Your symptoms do not improve, even after treatment.  You have more discharge or pain when urinating.  You have a fever.  You have pain in your abdomen.  You have pain during sex.  You have vaginal bleeding between periods. Summary  Bacterial vaginosis is a vaginal infection that occurs when the normal balance of bacteria in the vagina is disrupted.  Because bacterial vaginosis increases your risk for STIs (sexually transmitted infections), getting treated can help reduce your risk for chlamydia, gonorrhea, herpes, and HIV (human immunodeficiency virus). Treatment is also important for preventing complications in pregnant women, because the condition can cause an early (premature) delivery.  This condition is treated with antibiotic medicines. These may be given as a pill, a vaginal cream, or a medicine that is put into the vagina (suppository). This information is not intended to replace advice given to you by your health care provider. Make sure you discuss any questions you have with your health care provider. Document Released: 06/12/2005 Document Revised: 02/26/2016 Document Reviewed: 02/26/2016 Elsevier Interactive Patient Education  2017 [employer]. Metronidazole tablets or capsules What is this medicine? METRONIDAZOLE (me troe NI da zole) is an antiinfective. It is used to treat certain kinds of bacterial and protozoal infections. It will not work for colds, flu, or other viral infections. This medicine may be used for other purposes; ask your health care provider or pharmacist if you have questions. COMMON BRAND NAME(S): Flagyl What should I tell my health care provider before I take this medicine? They need to know if you have any of these conditions: -anemia or other blood disorders -disease of the nervous system -fungal or yeast infection -if you drink alcohol containing drinks -liver disease -seizures -an unusual or allergic reaction to  metronidazole, or other medicines, foods, dyes, or preservatives -pregnant or trying to get pregnant -breast-feeding How should I use this medicine? Take this medicine by mouth with a full glass of water. Follow the directions on the prescription label. Take your medicine at regular intervals. Do not take your medicine more often than directed. Take all of your medicine as directed even if you think you are better. Do not skip doses or stop your medicine early. Talk to your pediatrician regarding the use of this medicine in children. Special care may be needed. Overdosage: If you think you have taken too much of this medicine contact a poison control center or emergency room at once. NOTE: This medicine is only for you. Do not share this medicine with others. What if I miss a dose? If you miss a dose, take it as soon as you can. If it is almost time for your next dose, take only that dose. Do not take double or extra doses. What may interact with this medicine? Do not take this medicine with any of the following medications: -alcohol or any product that contains alcohol -amprenavir oral solution -cisapride -disulfiram -dofetilide -dronedarone -paclitaxel injection -pimozide -ritonavir oral  solution -sertraline oral solution -sulfamethoxazole-trimethoprim injection -thioridazine -ziprasidone This medicine may also interact with the following medications: -birth control pills -cimetidine -lithium -other medicines that prolong the QT interval (cause an abnormal heart rhythm) -phenobarbital -phenytoin -warfarin This list may not describe all possible interactions. Give your health care provider a list of all the medicines, herbs, non-prescription drugs, or dietary supplements you use. Also tell them if you smoke, drink alcohol, or use illegal drugs. Some items may interact with your medicine. What should I watch for while using this medicine? Tell your doctor or health care  professional if your symptoms do not improve or if they get worse. You may get drowsy or dizzy. Do not drive, use machinery, or do anything that needs mental alertness until you know how this medicine affects you. Do not stand or sit up quickly, especially if you are an older patient. This reduces the risk of dizzy or fainting spells. Avoid alcoholic drinks while you are taking this medicine and for three days afterward. Alcohol may make you feel dizzy, sick, or flushed. If you are being treated for a sexually transmitted disease, avoid sexual contact until you have finished your treatment. Your sexual partner may also need treatment. What side effects may I notice from receiving this medicine? Side effects that you should report to your doctor or health care professional as soon as possible: -allergic reactions like skin rash or hives, swelling of the face, lips, or tongue -confusion, clumsiness -difficulty speaking -discolored or sore mouth -dizziness -fever, infection -numbness, tingling, pain or weakness in the hands or feet -trouble passing urine or change in the amount of urine -redness, blistering, peeling or loosening of the skin, including inside the mouth -seizures -unusually weak or tired -vaginal irritation, dryness, or discharge Side effects that usually do not require medical attention (report to your doctor or health care professional if they continue or are bothersome): -diarrhea -headache -irritability -metallic taste -nausea -stomach pain or cramps -trouble sleeping This list may not describe all possible side effects. Call your doctor for medical advice about side effects. You may report side effects to FDA at 1-800-FDA-1088. Where should I keep my medicine? Keep out of the reach of children. Store at room temperature below 25 degrees C (77 degrees F). Protect from light. Keep container tightly closed. Throw away any unused medicine after the expiration date. NOTE:  This sheet is a summary. It may not cover all possible information. If you have questions about this medicine, talk to your doctor, pharmacist, or health care provider.  2018 Elsevier/Gold Standard (2013-01-17 14:08:39) Terconazole vaginal cream What is this medicine? TERCONAZOLE (ter KON a zole) is an antifungal medicine. It is used to treat yeast infections of the vagina. This medicine may be used for other purposes; ask your health care provider or pharmacist if you have questions. COMMON BRAND NAME(S): Terazol 3, Terazol 7, Zazole What should I tell my health care provider before I take this medicine? They need to know if you have any of these conditions: -an unusual or allergic reaction to terconazole, other antifungals, other medicines, foods, dyes or preservatives -pregnant or trying to get pregnant -breast-feeding How should I use this medicine? This medicine is only for use in the vagina. Do not take by mouth. Wash hands before and after use. Read package directions carefully before using. Use this medicine at bedtime, unless otherwise directed by your doctor or health care professional. Screw the applicator onto the end of the tube and squeeze the tube  to fill the applicator. Remove the applicator from the tube. Lie on your back. Gently insert the applicator tip high in the vagina and push the plunger to release the cream into the vagina. Gently remove the applicator. Wash the applicator well with warm water and soap. Use at regular intervals. Do not get this medicine in your eyes. If you do, rinse out with plenty of cool tap water. Finish the full course prescribed by your doctor or health care professional even if you think your condition is better. Do not stop using this medicine if your menstrual period starts during the time of treatment. Talk to your pediatrician regarding the use of this medicine in children. Special care may be needed. Overdosage: If you think you have taken too  much of this medicine contact a poison control center or emergency room at once. NOTE: This medicine is only for you. Do not share this medicine with others. What if I miss a dose? If you miss a dose, use it as soon as you can. If it is almost time for your next dose, use only that dose. Do not use double or extra doses. What may interact with this medicine? Interactions are not expected. Do not use any other vaginal products without telling your doctor or health care professional. This list may not describe all possible interactions. Give your health care provider a list of all the medicines, herbs, non-prescription drugs, or dietary supplements you use. Also tell them if you smoke, drink alcohol, or use illegal drugs. Some items may interact with your medicine. What should I watch for while using this medicine? Tell your doctor or health care professional if your symptoms do not start to get better within a few days. It is better not to have sex until you have finished your treatment. If you have sex, your partner should use a condom during sex to help prevent transfer of the infection. Your sexual partner may also need treatment. Vaginal medicines usually will come out of the vagina during treatment. To keep the medicine from getting on your clothing, wear a mini-pad or sanitary napkin. The use of tampons is not recommended since they may soak up the medicine. To help clear up the infection, wear freshly washed cotton, not synthetic, underwear. What side effects may I notice from receiving this medicine? Side effects that you should report to your doctor or health care professional as soon as possible: -painful or difficult urination -vaginal pain Side effects that usually do not require medical attention (report to your doctor or health care professional if they continue or are bothersome): -headache -menstrual pain -stomach upset -vaginal irritation, itching or burning This list may not  describe all possible side effects. Call your doctor for medical advice about side effects. You may report side effects to FDA at 1-800-FDA-1088. Where should I keep my medicine? Keep out of the reach of children. Store at room temperature between 15 and 30 degrees C (59 and 86 degrees F). Throw away any unused medicine after the expiration date. NOTE: This sheet is a summary. It may not cover all possible information. If you have questions about this medicine, talk to your doctor, pharmacist, or health care provider.  2018 Elsevier/Gold Standard (2008-02-26 13:51:27)

## 2016-12-21 LAB — HEMOGLOBIN A1C
Hgb A1c MFr Bld: 5.3 % (ref <5.7)
MEAN PLASMA GLUCOSE: 105 mg/dL

## 2016-12-21 LAB — AST: AST: 22 U/L (ref 10–35)

## 2016-12-21 LAB — ALT: ALT: 24 U/L (ref 6–29)

## 2016-12-25 ENCOUNTER — Ambulatory Visit (INDEPENDENT_AMBULATORY_CARE_PROVIDER_SITE_OTHER): Payer: Medicare Other | Admitting: Family Medicine

## 2016-12-25 ENCOUNTER — Encounter: Payer: Self-pay | Admitting: Family Medicine

## 2016-12-25 VITALS — BP 103/63 | HR 73 | Temp 98.0°F | Resp 18 | Ht 64.17 in | Wt 136.0 lb

## 2016-12-25 DIAGNOSIS — R1013 Epigastric pain: Secondary | ICD-10-CM | POA: Diagnosis not present

## 2016-12-25 DIAGNOSIS — M150 Primary generalized (osteo)arthritis: Secondary | ICD-10-CM | POA: Diagnosis not present

## 2016-12-25 DIAGNOSIS — Z23 Encounter for immunization: Secondary | ICD-10-CM | POA: Diagnosis not present

## 2016-12-25 DIAGNOSIS — M5136 Other intervertebral disc degeneration, lumbar region: Secondary | ICD-10-CM | POA: Diagnosis not present

## 2016-12-25 DIAGNOSIS — M159 Polyosteoarthritis, unspecified: Secondary | ICD-10-CM

## 2016-12-25 DIAGNOSIS — F5104 Psychophysiologic insomnia: Secondary | ICD-10-CM | POA: Diagnosis not present

## 2016-12-25 DIAGNOSIS — N952 Postmenopausal atrophic vaginitis: Secondary | ICD-10-CM | POA: Diagnosis not present

## 2016-12-25 DIAGNOSIS — M5432 Sciatica, left side: Secondary | ICD-10-CM

## 2016-12-25 LAB — POCT URINALYSIS DIP (MANUAL ENTRY)
BILIRUBIN UA: NEGATIVE
BILIRUBIN UA: NEGATIVE mg/dL
Glucose, UA: NEGATIVE mg/dL
Nitrite, UA: NEGATIVE
PH UA: 6.5 (ref 5.0–8.0)
Protein Ur, POC: NEGATIVE mg/dL
Spec Grav, UA: 1.01 (ref 1.010–1.025)
Urobilinogen, UA: 0.2 E.U./dL

## 2016-12-25 MED ORDER — ZOSTER VAC RECOMB ADJUVANTED 50 MCG/0.5ML IM SUSR: 0.5000 mL | Freq: Once | INTRAMUSCULAR | 1 refills | Status: AC

## 2016-12-25 MED ORDER — DULOXETINE HCL 20 MG PO CPEP: 20.0000 mg | ORAL_CAPSULE | Freq: Every day | ORAL | 5 refills | Status: DC

## 2016-12-25 MED ORDER — TRAMADOL HCL 50 MG PO TABS: 50.0000 mg | ORAL_TABLET | Freq: Three times a day (TID) | ORAL | 2 refills | Status: DC | PRN

## 2016-12-25 NOTE — Progress Notes (Signed)
Subjective:    Patient ID: Kristin Tapia, female    DOB: Feb 15, 1946, 71 y.o.   MRN: [id number]  12/25/2016  Back Pain; Insomnia; and Arthritis   HPI  This 71 y.o. female presents for four month follow-up of lower back pain, insomnia, osteoarthritis pain. Saw dr. Elson Clan; referred for injection; provided relief for 48 hours; follow-up with Dimonski who recommended surgery; sounds horrible; anterior approach and vascular surgeon present; four hour surgery.  Back brace for six weeks; bad pain for four weeks; for three months could not bend or lift.  In six to nine months, 90% of people have relief of pain.  Does not feel that bad yet.  As long as can take Meloxicam, Gabapentin, Tylenol, TENS unit.   Then a couple of weeks ago, awoke with nausea, epigastric pain.  Felt miserable.  Called and offered appointment for today.  Discussed with daughter who suggested Meloxicam induced reflux/gastritis.  Did not want to take PPI; took Zantac for a couple of days.  Rechallenged Meloxicam with acute recurrent epiastritc pain.    Meloxicam helps hand pain, L shoulder pain, tennis elbow.  Does not know what else there is. Getting deep muscle massage.  Taking Meloxicam 7.5mg  bid.  Not horrible pain for any one time.  Must change positions.   Tylenol qhs with 3 Gabapentin. If has pain during the day, will take it again. No further bid.  Last trial of Meloxicam  Stopped Gabapentin during the day.  Now taking all three at night.  Sleeping better.    Recurrent vaginitis: once per day for one week and then once per week for six months.    Review of Systems  Constitutional: Negative for chills, diaphoresis, fatigue and fever.  Eyes: Negative for visual disturbance.  Respiratory: Negative for cough and shortness of breath.   Cardiovascular: Negative for chest pain, palpitations and leg swelling.  Gastrointestinal: Negative for abdominal distention, abdominal pain, anal bleeding, blood in stool, constipation,  diarrhea, nausea, rectal pain and vomiting.  Endocrine: Negative for cold intolerance, heat intolerance, polydipsia, polyphagia and polyuria.  Genitourinary: Negative for vaginal discharge and vaginal pain.  Musculoskeletal: Positive for arthralgias and back pain. Negative for gait problem, joint swelling, myalgias, neck pain and neck stiffness.  Neurological: Negative for dizziness, tremors, seizures, syncope, facial asymmetry, speech difficulty, weakness, light-headedness, numbness and headaches.  Psychiatric/Behavioral: Positive for sleep disturbance.    Past Medical History:  Diagnosis Date  . Abnormality of gait 03/11/2009  . Allergy    immunotherapy/[facility]arma [place].  Marland Kitchen Anxiety   . Arthritis   . Asthma    childhood  . Cataract   . Cavus deformity of foot, acquired 03/11/2009  . Cystocele   . Detached retina   . GERD (gastroesophageal reflux disease)   . Hip pain 01/17/2013  . Metatarsalgia 03/11/2009  . Osteoporosis   . Postmenopausal atrophic vaginitis   . Rectocele   . Restless leg syndrome    L leg; s/p consultation by Dohmeier/Neurology.  . Sciatica of left side 01/17/2010  . Squamous cell carcinoma of skin 01/25/2015   L shoulder  . Thyroid disease    Past Surgical History:  Procedure Laterality Date  . APPENDECTOMY    . COLONOSCOPY  04/2009   Int & Ext Hemorrhoids, L sided diverticula.  Magod.  Repeat 5 years  . ESOPHAGOGASTRODUODENOSCOPY  04/2009   tiny HH, minimal antritis, gastric polyps. Magod.  Marland Kitchen EYE SURGERY    . MYOMECTOMY     [place].  Marland Kitchen  TONSILLECTOMY     Allergies  Allergen Reactions  . Sulfonamide Derivatives     Social History   Social History  . Marital status: Married    Spouse name: N/A  . Number of children: 2  . Years of education: N/A   Occupational History  . 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   Social History Main Topics  . Smoking status: Never Smoker  . Smokeless tobacco: Never Used  . Alcohol use No  .  Drug use: No  . Sexual activity: Yes    Partners: Male    Birth control/ protection: Post-menopausal   Other Topics Concern  . Not on file   Social History Narrative   Marital status:  Married x 47 years; happily married      Children:  2 children; 1 grandchild Berline Lopes)      Lives: with husband.      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      Tobacco: never      Alcohol: none      Drugs: none      Exercise: daily.  6 miles walking daily; water aerobics three days per week warm water.  1-5-2.0 hours per day. Walks atleast 10,000 steps per day; warm water exercises three days per week.      Advanced Directives:  +Living Will; FULL CODE; no prolonged measures.       ADLs: independent with all ADLs; no assistant devices for ambulation.      Family History  Problem Relation Age of Onset  . Arthritis Mother        mild  . Hypertension Mother   . Dementia Mother   . Cancer Mother 1       breast cancer  . COPD Father   . Stroke Father   . Seizures Brother   . Osteoporosis Neg Hx        Objective:    BP 103/63   Pulse 73   Temp 98 F (36.7 C) (Oral)   Resp 18   Ht 5' 4.17" (1.63 m)   Wt 136 lb (61.7 kg)   SpO2 98%   BMI 23.22 kg/m  Physical Exam  Constitutional: She is oriented to person, place, and time. She appears well-developed and well-nourished. No distress.  HENT:  Head: Normocephalic and atraumatic.  Right Ear: External ear normal.  Left Ear: External ear normal.  Nose: Nose normal.  Mouth/Throat: Oropharynx is clear and moist.  Eyes: Conjunctivae and EOM are normal. Pupils are equal, round, and reactive to light.  Neck: Normal range of motion. Neck supple. Carotid bruit is not present. No thyromegaly present.  Cardiovascular: Normal rate, regular rhythm, normal heart sounds and intact distal pulses.  Exam reveals no gallop and no friction rub.   No murmur heard. Pulmonary/Chest: Effort normal  and breath sounds normal. She has no wheezes. She has no rales.  Abdominal: Soft. Bowel sounds are normal. She exhibits no distension and no mass. There is no tenderness. There is no rebound and no guarding.  Lymphadenopathy:    She has no cervical adenopathy.  Neurological: She is alert and oriented to person, place, and time. No cranial nerve deficit.  Skin: Skin is warm and dry. No rash noted. She is not diaphoretic. No erythema. No pallor.  Psychiatric: She has a normal mood and affect. Her behavior is normal.   Depression screen [facility] 2/9 12/25/2016 08/24/2016 08/23/2016  06/13/2016 02/16/2016  Decreased Interest 0 0 0 0 0  Down, Depressed, Hopeless 0 0 0 0 0  PHQ - 2 Score 0 0 0 0 0   Fall Risk  12/25/2016 08/24/2016 08/23/2016 06/13/2016 02/16/2016  Falls in the past year? No No No No No  Number falls in past yr: - - - - -  Risk for fall due to : - - - - -        Assessment & Plan:   1. Abdominal pain, epigastric   2. Sciatica of left side   3. Degenerative lumbar disc   4. Primary osteoarthritis involving multiple joints   5. Atrophic vaginitis   6. Psychophysiological insomnia   7. Need for shingles vaccine    -new onset abdominal pain epigastric with Meloxicam use daily; hold Meloxicam for two to four more weeks and then rechallenge with Meloxicam. -rx for Tramadol provided for moderate pain. -rx for Cymbalta provided for chronic osteoarthritis pain and underlying anxiety due to chronic daily pain. -increase Tylenol to tid. -continue gabapentin and xanax qhs for insomnia.   Orders Placed This Encounter  Procedures  . CBC with Differential/Platelet  . Comprehensive metabolic panel  . POCT urinalysis dipstick   Meds ordered this encounter  Medications  . traMADol (ULTRAM) 50 MG tablet    Sig: Take 1 tablet (50 mg total) by mouth every 8 (eight) hours as needed.    Dispense:  60 tablet    Refill:  2  . DULoxetine (CYMBALTA) 20 MG capsule    Sig: Take 1 capsule (20 mg total)  by mouth daily.    Dispense:  30 capsule    Refill:  5  . Zoster Vac Recomb Adjuvanted [facility]) injection    Sig: Inject 0.5 mLs into the muscle once.    Dispense:  0.5 mL    Refill:  1    Return in about 2 months (around 02/25/2017) for recheck.   Deronte Solis Elayne Guerin, M.D. Primary Care at [facility] [address] [place], [place]  [address] [phone] phone [phone] fax

## 2016-12-25 NOTE — Patient Instructions (Addendum)
  1. Hold Meloxicam for one month. 2.  Start Cymbalta 20mg  one every morning. 3.  Increase Tylenol to three times daily. 4.  You can take Tramadol in addition to Tylenol if needed. 5.  You can continue Gabapentin and Xanax at night for sleep.     IF you received an x-ray today, you will receive an invoice from [facility]. Please contact [facility] at [phone] with questions or concerns regarding your invoice.   IF you received labwork today, you will receive an invoice from [facility]. Please contact [facility] at [phone] with questions or concerns regarding your invoice.   Our billing staff will not be able to assist you with questions regarding bills from these companies.  You will be contacted with the lab results as soon as they are available. The fastest way to get your results is to activate your My Chart account. Instructions are located on the last page of this paperwork. If you have not heard from [place] regarding the results in 2 weeks, please contact this office.

## 2016-12-26 LAB — CBC WITH DIFFERENTIAL/PLATELET
Basophils Absolute: 0 10*3/uL (ref 0.0–0.2)
Basos: 1 %
EOS (ABSOLUTE): 0.1 10*3/uL (ref 0.0–0.4)
EOS: 2 %
HEMATOCRIT: 41.1 % (ref 34.0–46.6)
HEMOGLOBIN: 14 g/dL (ref 11.1–15.9)
IMMATURE GRANS (ABS): 0 10*3/uL (ref 0.0–0.1)
IMMATURE GRANULOCYTES: 0 %
LYMPHS ABS: 1.7 10*3/uL (ref 0.7–3.1)
LYMPHS: 30 %
MCH: 32.7 pg (ref 26.6–33.0)
MCHC: 34.1 g/dL (ref 31.5–35.7)
MCV: 96 fL (ref 79–97)
MONOCYTES: 8 %
Monocytes Absolute: 0.4 10*3/uL (ref 0.1–0.9)
NEUTROS PCT: 59 %
Neutrophils Absolute: 3.4 10*3/uL (ref 1.4–7.0)
Platelets: 273 10*3/uL (ref 150–379)
RBC: 4.28 x10E6/uL (ref 3.77–5.28)
RDW: 13.1 % (ref 12.3–15.4)
WBC: 5.7 10*3/uL (ref 3.4–10.8)

## 2016-12-26 LAB — COMPREHENSIVE METABOLIC PANEL
ALBUMIN: 4.6 g/dL (ref 3.5–4.8)
ALT: 25 IU/L (ref 0–32)
AST: 27 IU/L (ref 0–40)
Albumin/Globulin Ratio: 2 (ref 1.2–2.2)
Alkaline Phosphatase: 46 IU/L (ref 39–117)
BUN / CREAT RATIO: 18 (ref 12–28)
BUN: 18 mg/dL (ref 8–27)
Bilirubin Total: <0.2 mg/dL (ref 0.0–1.2)
CALCIUM: 9.7 mg/dL (ref 8.7–10.3)
CO2: 26 mmol/L (ref 20–29)
CREATININE: 1 mg/dL (ref 0.57–1.00)
Chloride: 96 mmol/L (ref 96–106)
GFR calc Af Amer: 65 mL/min/{1.73_m2} (ref >59)
GFR, EST NON AFRICAN AMERICAN: 57 mL/min/{1.73_m2} — AB (ref >59)
GLOBULIN, TOTAL: 2.3 g/dL (ref 1.5–4.5)
Glucose: 88 mg/dL (ref 65–99)
Potassium: 4.3 mmol/L (ref 3.5–5.2)
SODIUM: 141 mmol/L (ref 134–144)
TOTAL PROTEIN: 6.9 g/dL (ref 6.0–8.5)

## 2017-01-19 ENCOUNTER — Ambulatory Visit (INDEPENDENT_AMBULATORY_CARE_PROVIDER_SITE_OTHER): Payer: Medicare Other | Admitting: Family Medicine

## 2017-01-19 ENCOUNTER — Encounter: Payer: Self-pay | Admitting: Family Medicine

## 2017-01-19 VITALS — BP 107/68 | HR 77 | Temp 97.4°F | Resp 17 | Ht 64.17 in | Wt 135.6 lb

## 2017-01-19 DIAGNOSIS — R319 Hematuria, unspecified: Secondary | ICD-10-CM

## 2017-01-19 DIAGNOSIS — N3001 Acute cystitis with hematuria: Secondary | ICD-10-CM | POA: Diagnosis not present

## 2017-01-19 LAB — POCT URINALYSIS DIP (MANUAL ENTRY)
Glucose, UA: NEGATIVE mg/dL
Nitrite, UA: POSITIVE — AB
PH UA: 6 (ref 5.0–8.0)
Protein Ur, POC: >=300 mg/dL — AB
Spec Grav, UA: 1.025 (ref 1.010–1.025)
Urobilinogen, UA: 1 E.U./dL

## 2017-01-19 MED ORDER — CEPHALEXIN 500 MG PO CAPS: 500.0000 mg | ORAL_CAPSULE | Freq: Two times a day (BID) | ORAL | 0 refills | Status: AC

## 2017-01-19 NOTE — Patient Instructions (Addendum)
   IF you received an x-ray today, you will receive an invoice from [facility]. Please contact [facility] at [phone] with questions or concerns regarding your invoice.   IF you received labwork today, you will receive an invoice from [facility]. Please contact [facility] at [phone] with questions or concerns regarding your invoice.   Our billing staff will not be able to assist you with questions regarding bills from these companies.  You will be contacted with the lab results as soon as they are available. The fastest way to get your results is to activate your My Chart account. Instructions are located on the last page of this paperwork. If you have not heard from us regarding the results in 2 weeks, please contact this office.     Urinary Tract Infection, Adult A urinary tract infection (UTI) is an infection of any part of the urinary tract, which includes the kidneys, ureters, bladder, and urethra. These organs make, store, and get rid of urine in the body. UTI can be a bladder infection (cystitis) or kidney infection (pyelonephritis). What are the causes? This infection may be caused by fungi, viruses, or bacteria. Bacteria are the most common cause of UTIs. This condition can also be caused by repeated incomplete emptying of the bladder during urination. What increases the risk? This condition is more likely to develop if:  You ignore your need to urinate or hold urine for long periods of time.  You do not empty your bladder completely during urination.  You wipe back to front after urinating or having a bowel movement, if you are female.  You are uncircumcised, if you are female.  You are constipated.  You have a urinary catheter that stays in place (indwelling).  You have a weak defense (immune) system.  You have a medical condition that affects your bowels, kidneys, or bladder.  You have diabetes.  You take antibiotic medicines frequently or  for long periods of time, and the antibiotics no longer work well against certain types of infections (antibiotic resistance).  You take medicines that irritate your urinary tract.  You are exposed to chemicals that irritate your urinary tract.  You are female. What are the signs or symptoms? Symptoms of this condition include:  Fever.  Frequent urination or passing small amounts of urine frequently.  Needing to urinate urgently.  Pain or burning with urination.  Urine that smells bad or unusual.  Cloudy urine.  Pain in the lower abdomen or back.  Trouble urinating.  Blood in the urine.  Vomiting or being less hungry than normal.  Diarrhea or abdominal pain.  Vaginal discharge, if you are female. How is this diagnosed? This condition is diagnosed with a medical history and physical exam. You will also need to provide a urine sample to test your urine. Other tests may be done, including:  Blood tests.  Sexually transmitted disease (STD) testing. If you have had more than one UTI, a cystoscopy or imaging studies may be done to determine the cause of the infections. How is this treated? Treatment for this condition often includes a combination of two or more of the following:  Antibiotic medicine.  Other medicines to treat less common causes of UTI.  Over-the-counter medicines to treat pain.  Drinking enough water to stay hydrated. Follow these instructions at home:  Take over-the-counter and prescription medicines only as told by your health care provider.  If you were prescribed an antibiotic, take it as told by your health care   provider. Do not stop taking the antibiotic even if you start to feel better.  Avoid alcohol, caffeine, tea, and carbonated beverages. They can irritate your bladder.  Drink enough fluid to keep your urine clear or pale yellow.  Keep all follow-up visits as told by your health care provider. This is important.  Make sure  to:  Empty your bladder often and completely. Do not hold urine for long periods of time.  Empty your bladder before and after sex.  Wipe from front to back after a bowel movement if you are female. Use each tissue one time when you wipe. Contact a health care provider if:  You have back pain.  You have a fever.  You feel nauseous or vomit.  Your symptoms do not get better after 3 days.  Your symptoms go away and then return. Get help right away if:  You have severe back pain or lower abdominal pain.  You are vomiting and cannot keep down any medicines or water. This information is not intended to replace advice given to you by your health care provider. Make sure you discuss any questions you have with your health care provider. Document Released: 03/22/2005 Document Revised: 11/24/2015 Document Reviewed: 05/03/2015 Elsevier Interactive Patient Education  2017 Elsevier Inc.  

## 2017-01-19 NOTE — Progress Notes (Signed)
Chief Complaint  Patient presents with  . Urinary Tract Infection    possible uti, fullness in bladder, blobs of blood in urine, onset: this morning.    HPI  Urinary Tract Infection: Patient complains of burning with urination, dysuria, foul smelling urine, hematuria, suprapubic pressure and urgency She has had symptoms for 1 days. Patient also complains of bladder fullness. Patient denies back pain, fever and stomach ache. Patient does not have a history of recurrent UTI.  Patient does not have a history of pyelonephritis.   Past Medical History:  Diagnosis Date  . Abnormality of gait 03/11/2009  . Allergy    immunotherapy/[facility] [place].  Marland Kitchen Anxiety   . Arthritis   . Asthma    childhood  . Cataract   . Cavus deformity of foot, acquired 03/11/2009  . Cystocele   . Detached retina   . GERD (gastroesophageal reflux disease)   . Hip pain 01/17/2013  . Metatarsalgia 03/11/2009  . Osteoporosis   . Postmenopausal atrophic vaginitis   . Rectocele   . Restless leg syndrome    L leg; s/p consultation by Dohmeier/Neurology.  . Sciatica of left side 01/17/2010  . Squamous cell carcinoma of skin 01/25/2015   L shoulder  . Thyroid disease     Current Outpatient Prescriptions  Medication Sig Dispense Refill  . acetaminophen (TYLENOL) 325 MG tablet Take 500 mg by mouth 2 (two) times daily.     Marland Kitchen ALPRAZolam (XANAX) 1 MG tablet TAKE 1/2 TABLET BY MOUTH AT BEDTIME AS NEEDED FOR SLEEP 30 tablet 3  . Calcium Carbonate-Vitamin D (CALCIUM 600+D3) 600-400 MG-UNIT per tablet Take 1 tablet by mouth daily.     . cholecalciferol (VITAMIN D) 400 UNITS TABS Take 2,000 Units by mouth daily.     . cycloSPORINE (RESTASIS) 0.05 % ophthalmic emulsion Place 1 drop into both eyes once.     . DULoxetine (CYMBALTA) 20 MG capsule Take 1 capsule (20 mg total) by mouth daily. 30 capsule 5  . Estradiol 10 MCG TABS vaginal tablet Place 1 tablet (10 mcg total) vaginally 2 (two) times a week. 8 tablet 11  .  fexofenadine (ALLEGRA) 180 MG tablet Take 180 mg by mouth every morning.    . gabapentin (NEURONTIN) 300 MG capsule Take 1-2 capsules (300-600 mg total) by mouth 3 (three) times daily as needed. (Patient taking differently: Take 300 mg by mouth at bedtime. ) 90 capsule 5  . ipratropium (ATROVENT) 0.03 % nasal spray Place 2 sprays into the nose 3 (three) times daily.    Marland Kitchen lidocaine (LMX) 4 % cream Apply 1 application topically as needed.    Marland Kitchen MAGNESIUM GLYCINATE PLUS PO Take 400 mg by mouth.    . Omega-3 Fatty Acids (FISH OIL) 1200 MG CAPS Take 1,200 mg by mouth daily. Patient takes 2 gel caps daily    . terconazole (TERAZOL 7) 0.4 % vaginal cream Apply qhs for 1 week then once a week 45 g 6  . traMADol (ULTRAM) 50 MG tablet Take 1 tablet (50 mg total) by mouth every 8 (eight) hours as needed. 60 tablet 2  . XOPENEX HFA 45 MCG/ACT inhaler     . cephALEXin (KEFLEX) 500 MG capsule Take 1 capsule (500 mg total) by mouth 2 (two) times daily. 14 capsule 0  . metroNIDAZOLE (FLAGYL) 500 MG tablet Take 1 tablet (500 mg total) by mouth 2 (two) times daily. (Patient not taking: Reported on 01/19/2017) 14 tablet 0   No current facility-administered medications for this  visit.     Allergies:  Allergies  Allergen Reactions  . Sulfonamide Derivatives     Past Surgical History:  Procedure Laterality Date  . APPENDECTOMY    . COLONOSCOPY  04/2009   Int & Ext Hemorrhoids, L sided diverticula.  Magod.  Repeat 5 years  . ESOPHAGOGASTRODUODENOSCOPY  04/2009   tiny HH, minimal antritis, gastric polyps. Magod.  Marland Kitchen EYE SURGERY    . MYOMECTOMY     [place].  . TONSILLECTOMY      Social History   Social History  . Marital status: Married    Spouse name: N/A  . Number of children: 2  . Years of education: N/A   Occupational History  . 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   Social History Main Topics  . Smoking status: Never Smoker  . Smokeless tobacco: Never Used  . Alcohol use  No  . Drug use: No  . Sexual activity: Yes    Partners: Male    Birth control/ protection: Post-menopausal   Other Topics Concern  . Not on file   Social History Narrative   Marital status:  Married x 47 years; happily married      Children:  2 children; 1 grandchild Berline Lopes)      Lives: with husband.      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      Tobacco: never      Alcohol: none      Drugs: none      Exercise: daily.  6 miles walking daily; water aerobics three days per week warm water.  1-5-2.0 hours per day. Walks atleast 10,000 steps per day; warm water exercises three days per week.      Advanced Directives:  +Living Will; FULL CODE; no prolonged measures.       ADLs: independent with all ADLs; no assistant devices for ambulation.       ROS See hpi  Objective: Vitals:   01/19/17 1027  BP: 107/68  Pulse: 77  Resp: 17  Temp: (!) 97.4 F (36.3 C)  TempSrc: Oral  SpO2: 98%  Weight: 135 lb 9.6 oz (61.5 kg)  Height: 5' 4.17" (1.63 m)    Physical Exam   Physical Exam  Constitutional: She is oriented to person, place, and time. She appears well-developed and well-nourished.  HENT:  Head: Normocephalic and atraumatic.  Eyes: Conjunctivae and EOM are normal.  Cardiovascular: Normal rate, regular rhythm and normal heart sounds.   Pulmonary/Chest: Effort normal and breath sounds normal. No respiratory distress. She has no wheezes.  Abdominal: Normal appearance and bowel sounds are normal. There is no tenderness. There is no CVA tenderness.  Neurological: She is alert and oriented to person, place, and time.    Component     Latest Ref Rng & Units 01/19/2017 01/19/2017        10:39 AM 10:39 AM  Color, UA     yellow brown (A)   Clarity, UA     clear cloudy (A)   Glucose     negative mg/dL negative   Bilirubin, UA     negative moderate (A) small (15) (A)  Specific Gravity, UA     1.010 - 1.025 1.025    RBC, UA     negative large (A)   pH, UA     5.0 - 8.0 6.0   Protein, UA     negative mg/dL >=300 (A)  Urobilinogen, UA     0.2 or 1.0 E.U./dL 1.0   Nitrite, UA     Negative Positive (A)   Leukocytes, UA     Negative Large (3+) (A)    Assessment and Plan Haleigh was seen today for urinary tract infection.  Diagnoses and all orders for this visit:  Hematuria, unspecified type- most likely due to UTI -     POCT urinalysis dipstick  Acute cystitis with hematuria  Reviewed allergy profile Will use keflex Advised to rtc if hematuria persists to evaluate for other causes -     cephALEXin (KEFLEX) 500 MG capsule; Take 1 capsule (500 mg total) by mouth 2 (two) times daily.     [place]

## 2017-01-25 ENCOUNTER — Other Ambulatory Visit: Payer: Self-pay

## 2017-01-25 MED ORDER — MELOXICAM 7.5 MG PO TABS: 7.5000 mg | ORAL_TABLET | Freq: Two times a day (BID) | ORAL | 3 refills | Status: DC

## 2017-01-30 ENCOUNTER — Telehealth: Payer: Self-pay | Admitting: Family Medicine

## 2017-01-30 NOTE — Telephone Encounter (Signed)
Called patient. She states that Cephalexin 500mg  has caused Diarrhea. Finished medication last Thursday. Diarrhea started the day before finishing (Last Wednesday), Diarrhea has continued, with urgency. Please Advise.

## 2017-01-30 NOTE — Telephone Encounter (Signed)
Pt saw [facility] on 01/19/17 and the medication she was given is causing diaherra  Best number [phone]

## 2017-02-01 NOTE — Telephone Encounter (Signed)
Let the patient know to come in for an office visit.

## 2017-02-03 ENCOUNTER — Ambulatory Visit (INDEPENDENT_AMBULATORY_CARE_PROVIDER_SITE_OTHER): Payer: Medicare Other | Admitting: Family Medicine

## 2017-02-05 NOTE — Telephone Encounter (Signed)
Appointment made to see Dr. Tamala Julian on Saturday 02/03/17

## 2017-02-11 ENCOUNTER — Other Ambulatory Visit: Payer: Self-pay | Admitting: Family Medicine

## 2017-02-26 NOTE — Progress Notes (Signed)
Subjective:    Patient ID: Kristin Tapia, female    DOB: 1946/02/21, 71 y.o.   MRN: [id number]  02/27/2017  Pain (pain management for siatica )   HPI This 71 y.o. female presents for evaluation of chronic pain syndrome for sciatica and DDD lumbar spine and osteoarthritis.  Cymbatla caused nausea; pain improved.  Pain greatly improved. Then started alternating Meloxicam and Cymbalta.  Then did not have nausea with Cymbalta this week.  Pain has been really low this week; had 1-2 bad days and night per week. Much better than daily pain. Tylenol twice daily. Never took Tramadol.   Took Cymbalta 20mg  daily for three weeks.  Tendonitis of elbow improved but changed activity.  Insomnia continues to be an issue.   BP Readings from Last 3 Encounters:  02/27/17 105/65  01/19/17 107/68  12/25/16 103/63   Wt Readings from Last 3 Encounters:  02/27/17 137 lb (62.1 kg)  01/19/17 135 lb 9.6 oz (61.5 kg)  12/25/16 136 lb (61.7 kg)   Immunization History  Administered Date(s) Administered  . Hepatitis A 09/09/2012  . Hepatitis A, Adult 04/16/2013  . Hepatitis B 09/09/2012, 10/14/2012  . Hepatitis B, adult 04/16/2013  . Influenza Split 03/26/2012  . Influenza,inj,Quad PF,6+ Mos 04/16/2013, 03/16/2014, 03/11/2015, 02/27/2017  . Influenza-Unspecified 02/09/2016  . Pneumococcal Conjugate-13 05/18/2014  . Pneumococcal Polysaccharide-23 03/03/2011  . Td 07/13/2008  . Zoster 08/23/2007  . Zoster Recombinat (Shingrix) 12/27/2016    Review of Systems  Constitutional: Negative for chills, diaphoresis, fatigue and fever.  Eyes: Negative for visual disturbance.  Respiratory: Negative for cough and shortness of breath.   Cardiovascular: Negative for chest pain, palpitations and leg swelling.  Gastrointestinal: Positive for nausea. Negative for abdominal pain, constipation, diarrhea and vomiting.  Endocrine: Negative for cold intolerance, heat intolerance, polydipsia, polyphagia and  polyuria.  Musculoskeletal: Positive for arthralgias, back pain, myalgias, neck pain and neck stiffness. Negative for joint swelling.  Neurological: Negative for dizziness, tremors, seizures, syncope, facial asymmetry, speech difficulty, weakness, light-headedness, numbness and headaches.    Past Medical History:  Diagnosis Date  . Abnormality of gait 03/11/2009  . Allergy    immunotherapy/Sharma Rembrandt.  Marland Kitchen Anxiety   . Arthritis   . Asthma    childhood  . Cataract   . Cavus deformity of foot, acquired 03/11/2009  . Cystocele   . Detached retina   . GERD (gastroesophageal reflux disease)   . Hip pain 01/17/2013  . Metatarsalgia 03/11/2009  . Osteoporosis   . Postmenopausal atrophic vaginitis   . Rectocele   . Restless leg syndrome    L leg; s/p consultation by Dohmeier/Neurology.  . Sciatica of left side 01/17/2010  . Squamous cell carcinoma of skin 01/25/2015   L shoulder  . Thyroid disease    Past Surgical History:  Procedure Laterality Date  . APPENDECTOMY    . COLONOSCOPY  04/2009   Int & Ext Hemorrhoids, L sided diverticula.  [facility].  Repeat 5 years  . ESOPHAGOGASTRODUODENOSCOPY  04/2009   tiny HH, minimal antritis, gastric polyps. Magod.  Marland Kitchen EYE SURGERY    . MYOMECTOMY     [place].  . TONSILLECTOMY     Allergies  Allergen Reactions  . Sulfonamide Derivatives     Social History   Social History  . Marital status: Married    Spouse name: N/A  . Number of children: 2  . Years of education: N/A   Occupational History  . 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   Social History Main Topics  . Smoking status: Never Smoker  . Smokeless tobacco: Never Used  . Alcohol use No  . Drug use: No  . Sexual activity: Yes    Partners: Male    Birth control/ protection: Post-menopausal   Other Topics Concern  . Not on file   Social History Narrative   Marital status:  Married x 47 years; happily married      Children:  2 children; 1 grandchild  Berline Lopes)      Lives: with husband.      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      Tobacco: never      Alcohol: none      Drugs: none      Exercise: daily.  6 miles walking daily; water aerobics three days per week warm water.  1-5-2.0 hours per day. Walks atleast 10,000 steps per day; warm water exercises three days per week.      Advanced Directives:  +Living Will; FULL CODE; no prolonged measures.       ADLs: independent with all ADLs; no assistant devices for ambulation.      Family History  Problem Relation Age of Onset  . Arthritis Mother        mild  . Hypertension Mother   . Dementia Mother   . Cancer Mother 59       breast cancer  . COPD Father   . Stroke Father   . Seizures Brother   . Osteoporosis Neg Hx        Objective:    BP 105/65   Pulse 69   Temp 98.2 F (36.8 C) (Oral)   Resp 18   Ht 5' 4.17" (1.63 m)   Wt 137 lb (62.1 kg)   SpO2 98%   BMI 23.39 kg/m  Physical Exam  Constitutional: She is oriented to person, place, and time. She appears well-developed and well-nourished. No distress.  HENT:  Head: Normocephalic and atraumatic.  Right Ear: External ear normal.  Left Ear: External ear normal.  Nose: Nose normal.  Mouth/Throat: Oropharynx is clear and moist.  Eyes: Pupils are equal, round, and reactive to light. Conjunctivae and EOM are normal.  Neck: Normal range of motion. Neck supple. Carotid bruit is not present. No thyromegaly present.  Cardiovascular: Normal rate, regular rhythm, normal heart sounds and intact distal pulses.  Exam reveals no gallop and no friction rub.   No murmur heard. Pulmonary/Chest: Effort normal and breath sounds normal. She has no wheezes. She has no rales.  Abdominal: Soft. Bowel sounds are normal. She exhibits no distension and no mass. There is no tenderness. There is no rebound and no guarding.  Musculoskeletal:       Cervical back: Normal.       Lumbar  back: Normal.  Lymphadenopathy:    She has no cervical adenopathy.  Neurological: She is alert and oriented to person, place, and time. No cranial nerve deficit.  Skin: Skin is warm and dry. No rash noted. She is not diaphoretic. No erythema. No pallor.  Psychiatric: She has a normal mood and affect. Her behavior is normal.    No results found.  Fall Risk  02/27/2017 01/19/2017 01/19/2017 12/25/2016 08/24/2016  Falls in the past year? No No No No No  Number falls in past yr: - - - - -  Risk for fall due to : - - - - -  Assessment & Plan:   1. Degenerative lumbar disc   2. Primary osteoarthritis involving multiple joints   3. Tendinopathy of right rotator cuff   4. Right tennis elbow   5. Psychophysiological insomnia   6. Need for prophylactic vaccination and inoculation against influenza    -pain much improved with Cymbalta 20mg  daily yet suffered with significant nausea which has improved; recommend rechallenge with daily Cymbalta 20mg ; recommend Meloxicam in addition to Cymbalta when having more severe pain; continue Tylenol 2-3 times daily as well. -if nausea recurs and is intolerable, consider switch to [facility].    Orders Placed This Encounter  Procedures  . Flu Vaccine QUAD 36+ mos IM   No orders of the defined types were placed in this encounter.   Return in about 3 months (around 05/29/2017) for complete physical examiniation.   Jaretzy Lhommedieu Elayne Guerin, M.D. Primary [facility] at [facility] [address] [place], [place]  [address] [phone] phone [phone] fax

## 2017-02-27 ENCOUNTER — Ambulatory Visit (INDEPENDENT_AMBULATORY_CARE_PROVIDER_SITE_OTHER): Payer: Medicare Other | Admitting: Family Medicine

## 2017-02-27 ENCOUNTER — Ambulatory Visit: Payer: Medicare Other

## 2017-02-27 ENCOUNTER — Encounter: Payer: Self-pay | Admitting: Family Medicine

## 2017-02-27 VITALS — BP 105/65 | HR 69 | Temp 98.2°F | Resp 18 | Ht 64.17 in | Wt 137.0 lb

## 2017-02-27 DIAGNOSIS — Z23 Encounter for immunization: Secondary | ICD-10-CM

## 2017-02-27 DIAGNOSIS — M150 Primary generalized (osteo)arthritis: Secondary | ICD-10-CM | POA: Diagnosis not present

## 2017-02-27 DIAGNOSIS — F5104 Psychophysiologic insomnia: Secondary | ICD-10-CM | POA: Diagnosis not present

## 2017-02-27 DIAGNOSIS — M5136 Other intervertebral disc degeneration, lumbar region: Secondary | ICD-10-CM

## 2017-02-27 DIAGNOSIS — M7711 Lateral epicondylitis, right elbow: Secondary | ICD-10-CM

## 2017-02-27 DIAGNOSIS — M159 Polyosteoarthritis, unspecified: Secondary | ICD-10-CM

## 2017-02-27 DIAGNOSIS — M67911 Unspecified disorder of synovium and tendon, right shoulder: Secondary | ICD-10-CM | POA: Diagnosis not present

## 2017-02-27 NOTE — Patient Instructions (Signed)
     IF you received an x-ray today, you will receive an invoice from [facility]. Please contact [facility] at [phone] with questions or concerns regarding your invoice.   IF you received labwork today, you will receive an invoice from [facility]. Please contact [facility] at [phone] with questions or concerns regarding your invoice.   Our billing staff will not be able to assist you with questions regarding bills from these companies.  You will be contacted with the lab results as soon as they are available. The fastest way to get your results is to activate your My Chart account. Instructions are located on the last page of this paperwork. If you have not heard from us regarding the results in 2 weeks, please contact this office.     

## 2017-05-04 ENCOUNTER — Telehealth: Payer: Self-pay | Admitting: Family Medicine

## 2017-05-04 NOTE — Telephone Encounter (Signed)
Rx for estradiol

## 2017-05-04 NOTE — Telephone Encounter (Signed)
Copied from [place] #[id number]. Topic: Quick Communication - See Telephone Encounter >> May 04, 2017  9:29 AM Ebony Hail wrote: CRM for notification. See Telephone encounter for: pt states she was informed by pharm that rx would not be refilled. Pt is asking for call back from clinical explaining why rx will not be refilled as it has helped with preventing UTI. Estradiol (1 ML) cream apply vaginally 2x/week. Please call [phone]   05/04/17.

## 2017-05-07 NOTE — Telephone Encounter (Signed)
Patient states [facility] called and needs the Estradiol cream filled.   Patient states they were told we could not fill.       Please advise

## 2017-05-08 NOTE — Telephone Encounter (Signed)
Patient called and is very frustrated that the pharmacy is saying that DR Tamala Julian would not renew the script for estradiol cream. It is not a tablet. Per Patient. She said she does not understand why she would not renew it as she really needs this.

## 2017-05-08 NOTE — Telephone Encounter (Signed)
1. Please call [facility] and provide one year of refills for Estrogen cream as has previously been prescribed for patient by me.  2.  Then call patient; it appears that Dr. Toney Rakes changed her from Estrogen vaginal cream to Estrogen vaginal tablets in 04/2016;thus, that is why the vaginal tablet is on her medication list.

## 2017-05-10 ENCOUNTER — Telehealth: Payer: Self-pay

## 2017-05-10 NOTE — Telephone Encounter (Signed)
Called in Rx for Estrogen vaginal cream to pharmacy and informed pt.

## 2017-05-10 NOTE — Telephone Encounter (Signed)
Please advise.   Copied from [place] #[id number]. Topic: Quick Communication - See Telephone Encounter >> May 04, 2017  9:29 AM Ebony Hail wrote: CRM for notification. See Telephone encounter for: pt states she was informed by pharm that rx would not be refilled. Pt is asking for call back from clinical explaining why rx will not be refilled as it has helped with preventing UTI. Estradiol (1 ML) cream apply vaginally 2x/week. Please call [phone]   05/04/17. >> May 09, 2017  3:12 PM Boyd Kerbs wrote: Patient call again regarding Hormone Replacement [facility].  This was from Dr. Reginia Forts, her name is on the prescription.  Patient will be out as of Thursday night.  And the pharmacy sent in request and was declined but no explanation. Patient believes this is a mistake, doctor told her to continue taking it. This has not resolved

## 2017-05-10 NOTE — Telephone Encounter (Signed)
Pt advised that rx will be ready for pickup tomorrow.

## 2017-05-10 NOTE — Telephone Encounter (Signed)
Please review chart and telephone message from 05/04/17.  Per message, I recommended that clinical staff to call [facility].

## 2017-05-22 ENCOUNTER — Encounter: Payer: Self-pay | Admitting: Sports Medicine

## 2017-05-24 ENCOUNTER — Encounter: Payer: Self-pay | Admitting: Sports Medicine

## 2017-05-24 ENCOUNTER — Ambulatory Visit: Payer: Medicare Other | Admitting: Sports Medicine

## 2017-05-24 DIAGNOSIS — Q667 Congenital pes cavus, unspecified foot: Secondary | ICD-10-CM

## 2017-05-24 DIAGNOSIS — M25572 Pain in left ankle and joints of left foot: Secondary | ICD-10-CM

## 2017-05-25 ENCOUNTER — Encounter: Payer: Self-pay | Admitting: Sports Medicine

## 2017-05-25 NOTE — Assessment & Plan Note (Signed)
Ankles OK today  Keep using orthotics  Modify walking pattern as we discussed - does well at up to 12000 steps but gets pain past this

## 2017-05-25 NOTE — Assessment & Plan Note (Signed)
She has responded well to orthotics  Current ones are wearing down  New orthtoics made today  Test these for 1 month and modify if needed

## 2017-05-25 NOTE — Progress Notes (Signed)
CC: Chronic foot and ankle pain  Patient does a lot of walking She has osteopenia and wants to keep up weight bearing I first saw her for foot and ankle issues in 2010 We have made orthotics for her walking since 2013 These have helped lessen her pain  Currently bilateral forefoot pain Worse on left with some tingling into toes Orthotics are several years old and have lost cushion  Past med Hx Sciatica with a step off at lumbar level Does get some relief with gabapentin - takes 300 tid  ROS Gets color changes in feet Get cold easily No cramping  PE Pleasant F in NAD BP 96/62   Ht 5\' 5"  (1.651 m)   Wt 130 lb (59 kg)   BMI 21.63 kg/m   Feet show splaying on left callusing over MT bases Mod high arches bilaterally No TTP at AT or PF  Good ankle motion Ankle ligaments stable  Patient was fitted for a : standard, cushioned, semi-rigid orthotic. The orthotic was heated and afterward the patient stood on the orthotic blank positioned on the orthotic stand. The patient was positioned in subtalar neutral position and 10 degrees of ankle dorsiflexion in a weight bearing stance. After completion of molding, a stable base was applied to the orthotic blank. The blank was ground to a stable position for weight bearing. Size: 7 red EVA Base: blue medium EVA Posting: neuroma pad under forefoot Additional orthotic padding: heel wedge medially  After completion of new orthotics patient had better comfort We were able to give her a neutral foot strike on gait assessment

## 2017-06-13 ENCOUNTER — Encounter: Payer: Medicare Other | Admitting: Family Medicine

## 2017-06-18 ENCOUNTER — Encounter: Payer: Medicare Other | Admitting: Family Medicine

## 2017-06-20 ENCOUNTER — Ambulatory Visit (INDEPENDENT_AMBULATORY_CARE_PROVIDER_SITE_OTHER): Payer: Medicare Other | Admitting: Family Medicine

## 2017-06-20 ENCOUNTER — Other Ambulatory Visit: Payer: Self-pay

## 2017-06-20 ENCOUNTER — Encounter: Payer: Self-pay | Admitting: Family Medicine

## 2017-06-20 VITALS — BP 116/72 | HR 80 | Temp 98.1°F | Resp 16 | Ht 64.37 in | Wt 134.4 lb

## 2017-06-20 DIAGNOSIS — Z0000 Encounter for general adult medical examination without abnormal findings: Secondary | ICD-10-CM

## 2017-06-20 DIAGNOSIS — M8589 Other specified disorders of bone density and structure, multiple sites: Secondary | ICD-10-CM

## 2017-06-20 DIAGNOSIS — J301 Allergic rhinitis due to pollen: Secondary | ICD-10-CM

## 2017-06-20 DIAGNOSIS — F5104 Psychophysiologic insomnia: Secondary | ICD-10-CM | POA: Diagnosis not present

## 2017-06-20 DIAGNOSIS — N952 Postmenopausal atrophic vaginitis: Secondary | ICD-10-CM

## 2017-06-20 DIAGNOSIS — J4520 Mild intermittent asthma, uncomplicated: Secondary | ICD-10-CM

## 2017-06-20 DIAGNOSIS — M5432 Sciatica, left side: Secondary | ICD-10-CM

## 2017-06-20 DIAGNOSIS — M5136 Other intervertebral disc degeneration, lumbar region: Secondary | ICD-10-CM | POA: Diagnosis not present

## 2017-06-20 DIAGNOSIS — E7800 Pure hypercholesterolemia, unspecified: Secondary | ICD-10-CM

## 2017-06-20 LAB — POCT URINALYSIS DIP (MANUAL ENTRY)
Bilirubin, UA: NEGATIVE
GLUCOSE UA: NEGATIVE mg/dL
Ketones, POC UA: NEGATIVE mg/dL
NITRITE UA: NEGATIVE
Protein Ur, POC: NEGATIVE mg/dL
Spec Grav, UA: 1.015 (ref 1.010–1.025)
UROBILINOGEN UA: 0.2 U/dL
pH, UA: 7 (ref 5.0–8.0)

## 2017-06-20 MED ORDER — ALPRAZOLAM 0.5 MG PO TABS: 0.2500 mg | ORAL_TABLET | Freq: Every evening | ORAL | 5 refills | Status: DC | PRN

## 2017-06-20 MED ORDER — GABAPENTIN 300 MG PO CAPS: 300.0000 mg | ORAL_CAPSULE | Freq: Three times a day (TID) | ORAL | 5 refills | Status: DC | PRN

## 2017-06-20 NOTE — Progress Notes (Signed)
Subjective:    Patient ID: Kristin Tapia, female    DOB: 1946-01-26, 71 y.o.   MRN: [id number]  06/20/2017  Annual Exam    HPI This 71 y.o. female presents for Complete Physical Examination and follow-up of chronic medical conditions.  Last physical: Pap smear: Mammogram:  [facility];  Colonoscopy:  2016 Bone density: 2017 Eye exam:  2018; glasses Dental exam:   Every six months.  Estradiol HCL 4ml .02% twice weekly.  Chronic arthritis pain/DDD lumbar: moderate pain 2-3 days per week; the other days per week, pain is manageable.  Did stop taking Cymbalta due to inability to have orgasm. Got new orthotics with Dr. Oneida Alar.  Dr. Oneida Alar talked pt into taking Tramadol; filled three weeks ago; has taken one Tramadol per week and really helpful.  Taking Alprazolam once per week for insomnia.  Wants Xanax 0.5mg  to 1/2 at a time.  Tried CBD oil; tasted like straight fish oil.  Knows nutritionist.  Has several other friends Also taking Gabapentin.  Naltrexone 4mg .?    Having some constipation issues.  GERD with chest pain and nausea nad belching in past six weeks; tried Tums, Zantac.  Then took Omeprazole 14 days with improvement.  Low acid diet.  Taking additional fiber supplement; eats tons of vegetables and high fiber.  Prunes make reflux worse.   Taking Gabapentin qhs and sleeping well most nights; wakes up every 2 hours. Will get up and stretch after two hours. Prolonged sitting/standing/supine causes pain.  Keeps pt moving.   Management changes made at last visit include the following:  -pain much improved with Cymbalta 20mg  daily yet suffered with significant nausea which has improved; recommend rechallenge with daily Cymbalta 20mg ; recommend Meloxicam in addition to Cymbalta when having more severe pain; continue Tylenol 2-3 times daily as well. -if nausea recurs and is intolerable, consider switch to [facility].    Visual Acuity Screening   Right eye Left eye Both eyes  Without correction:      With correction: 20/25 20/25 20/25     BP Readings from Last 3 Encounters:  06/23/17 98/60  06/20/17 116/72  05/24/17 96/62   Wt Readings from Last 3 Encounters:  06/23/17 136 lb 2 oz (61.7 kg)  06/20/17 134 lb 6.4 oz (61 kg)  05/24/17 130 lb (59 kg)   Immunization History  Administered Date(s) Administered  . Hepatitis A 09/09/2012  . Hepatitis A, Adult 04/16/2013  . Hepatitis B 09/09/2012, 10/14/2012  . Hepatitis B, adult 04/16/2013  . Influenza Split 03/26/2012  . Influenza,inj,Quad PF,6+ Mos 04/16/2013, 03/16/2014, 03/11/2015, 02/27/2017  . Influenza-Unspecified 02/09/2016  . Pneumococcal Conjugate-13 05/18/2014  . Pneumococcal Polysaccharide-23 03/03/2011  . Td 07/13/2008  . Zoster 08/23/2007  . Zoster Recombinat (Shingrix) 12/27/2016, 02/24/2017    Review of Systems  Constitutional: Negative for activity change, appetite change, chills, diaphoresis, fatigue, fever and unexpected weight change.  HENT: Negative for congestion, dental problem, drooling, ear discharge, ear pain, facial swelling, hearing loss, mouth sores, nosebleeds, postnasal drip, rhinorrhea, sinus pressure, sneezing, sore throat, tinnitus, trouble swallowing and voice change.   Eyes: Negative for photophobia, pain, discharge, redness, itching and visual disturbance.  Respiratory: Positive for cough and wheezing. Negative for apnea, choking, chest tightness, shortness of breath and stridor.   Cardiovascular: Negative for chest pain, palpitations and leg swelling.  Gastrointestinal: Positive for constipation. Negative for abdominal distention, abdominal pain, anal bleeding, blood in stool, diarrhea, nausea, rectal pain and vomiting.  Endocrine: Negative for cold intolerance, heat intolerance, polydipsia, polyphagia and  polyuria.  Genitourinary: Positive for urgency. Negative for decreased urine volume, difficulty urinating, dyspareunia, dysuria, enuresis, flank pain, frequency, genital sores, hematuria,  menstrual problem, pelvic pain, vaginal bleeding, vaginal discharge and vaginal pain.  Musculoskeletal: Positive for arthralgias and back pain. Negative for gait problem, joint swelling, myalgias, neck pain and neck stiffness.  Skin: Negative for color change, pallor, rash and wound.  Allergic/Immunologic: Positive for environmental allergies. Negative for food allergies and immunocompromised state.  Neurological: Negative for dizziness, tremors, seizures, syncope, facial asymmetry, speech difficulty, weakness, light-headedness, numbness and headaches.  Hematological: Negative for adenopathy. Does not bruise/bleed easily.  Psychiatric/Behavioral: Positive for sleep disturbance. Negative for agitation, behavioral problems, confusion, decreased concentration, dysphoric mood, hallucinations, self-injury and suicidal ideas. The patient is not nervous/anxious and is not hyperactive.     Past Medical History:  Diagnosis Date  . Abnormality of gait 03/11/2009  . Allergy    immunotherapy/[facility] [place].  Marland Kitchen Anxiety   . Arthritis   . Asthma    childhood  . Cataract   . Cavus deformity of foot, acquired 03/11/2009  . Cystocele   . Detached retina   . GERD (gastroesophageal reflux disease)   . Hip pain 01/17/2013  . Metatarsalgia 03/11/2009  . Osteoporosis   . Postmenopausal atrophic vaginitis   . Rectocele   . Restless leg syndrome    L leg; s/p consultation by D[facility]meier/Neurology.  . Sciatica of left side 01/17/2010  . Squamous cell carcinoma of skin 01/25/2015   L shoulder  . Thyroid disease    Past Surgical History:  Procedure Laterality Date  . APPENDECTOMY    . COLONOSCOPY  04/2009   Int & Ext Hemorrhoids, L sided diverticula.  [place].  Repeat 5 years  . ESOPHAGOGASTRODUODENOSCOPY  04/2009   tiny HH, minimal antritis, gastric polyps. [place].  Marland Kitchen EYE SURGERY    . MYOMECTOMY     [place].  . TONSILLECTOMY     Allergies  Allergen Reactions  . Sulfonamide Derivatives    Current  Outpatient Medications on File Prior to Visit  Medication Sig Dispense Refill  . acetaminophen (TYLENOL) 325 MG tablet Take 750 mg by mouth 2 (two) times daily.     . Calcium Carbonate-Vitamin D (CALCIUM 600+D3) 600-400 MG-UNIT per tablet Take 1 tablet by mouth daily.     . cholecalciferol (VITAMIN D) 400 UNITS TABS Take 2,000 Units by mouth daily.     . clobetasol (TEMOVATE) 0.05 % external solution APPLY TO AFFECTED AREA 1 TO 3 TIMES A DAY FOR 10 DAYS  1  . cycloSPORINE (RESTASIS) 0.05 % ophthalmic emulsion Place 1 drop into both eyes once.     . Estradiol 10 MCG TABS vaginal tablet Place 1 tablet (10 mcg total) vaginally 2 (two) times a week. (Patient taking differently: Place 1 each vaginally 2 (two) times a week. ) 8 tablet 11  . fexofenadine (ALLEGRA) 180 MG tablet Take 180 mg by mouth every morning.    Marland Kitchen ipratropium (ATROVENT) 0.03 % nasal spray Place 2 sprays into the nose 3 (three) times daily.    Marland Kitchen lidocaine (LMX) 4 % cream Apply 1 application topically as needed.    Marland Kitchen MAGNESIUM GLYCINATE PLUS PO Take 400 mg by mouth.    . meloxicam (MOBIC) 7.5 MG tablet Take 1 tablet (7.5 mg total) by mouth 2 (two) times daily. (Patient taking differently: Take 7.5 mg by mouth every other day. ) 60 tablet 3  . Omega-3 Fatty Acids (FISH OIL) 1200 MG CAPS Take 1,200 mg by  mouth daily. Patient takes 2 gel caps daily    . terconazole (TERAZOL 7) 0.4 % vaginal cream Apply qhs for 1 week then once a week 45 g 6  . traMADol (ULTRAM) 50 MG tablet Take 50 mg by mouth every 8 (eight) hours as needed.  2   No current facility-administered medications on file prior to visit.    Social History   Socioeconomic History  . Marital status: Married    Spouse name: Not on file  . Number of children: 2  . Years of education: Not on file  . Highest education level: Not on file  Social Needs  . Financial resource strain: Not on file  . Food insecurity - worry: Not on file  . Food insecurity - inability: Not on  file  . Transportation needs - medical: Not on file  . Transportation needs - non-medical: Not on file  Occupational History  . 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  Tobacco Use  . Smoking status: Never Smoker  . Smokeless tobacco: Never Used  Substance and Sexual Activity  . Alcohol use: No  . Drug use: No  . Sexual activity: Yes    Partners: Male    Birth control/protection: Post-menopausal  Other Topics Concern  . Not on file  Social History Narrative   Marital status:  Married x 48 years; happily married      Children:  2 children; 1 grandchild Berline Lopes)      Lives: with husband.      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      Tobacco: never      Alcohol: none      Drugs: none      Exercise: daily.  3-4 miles walking daily; water aerobics three days per week warm water.  1-5-2.0 hours per day. Walks atleast 10,000 steps per day; warm water exercises three days per week.      Advanced Directives:  +Living Will; FULL CODE; no prolonged measures.       ADLs: independent with all ADLs; no assistant devices for ambulation.   Family History  Problem Relation Age of Onset  . Arthritis Mother        mild  . Hypertension Mother   . Dementia Mother   . Cancer Mother 50       breast cancer  . Hyperlipidemia Mother   . Heart disease Mother   . COPD Father   . Stroke Father   . Heart disease Father   . Seizures Brother   . Cancer Brother   . Osteoporosis Neg Hx        Objective:    BP 116/72   Pulse 80   Temp 98.1 F (36.7 C) (Oral)   Resp 16   Ht 5' 4.37" (1.635 m)   Wt 134 lb 6.4 oz (61 kg)   SpO2 98%   BMI 22.81 kg/m  Physical Exam  Constitutional: She is oriented to person, place, and time. She appears well-developed and well-nourished. No distress.  HENT:  Head: Normocephalic and atraumatic.  Right Ear: External ear normal.  Left Ear: External  ear normal.  Nose: Nose normal.  Mouth/Throat: Oropharynx is clear and moist.  Eyes: Conjunctivae and EOM are normal. Pupils are equal, round, and reactive to light.  Neck: Normal range of motion and full passive range of motion without pain. Neck supple. No  JVD present. Carotid bruit is not present. No thyromegaly present.  Cardiovascular: Normal rate, regular rhythm and normal heart sounds. Exam reveals no gallop and no friction rub.  No murmur heard. Pulmonary/Chest: Effort normal and breath sounds normal. She has no wheezes. She has no rales. Right breast exhibits no inverted nipple, no mass, no nipple discharge, no skin change and no tenderness. Left breast exhibits no inverted nipple, no mass, no nipple discharge, no skin change and no tenderness. Breasts are symmetrical.  Abdominal: Soft. Bowel sounds are normal. She exhibits no distension and no mass. There is no tenderness. There is no rebound and no guarding.  Musculoskeletal:       Right shoulder: Normal.       Left shoulder: Normal.       Cervical back: Normal.  Lymphadenopathy:    She has no cervical adenopathy.  Neurological: She is alert and oriented to person, place, and time. She has normal reflexes. No cranial nerve deficit. She exhibits normal muscle tone. Coordination normal.  Skin: Skin is warm and dry. No rash noted. She is not diaphoretic. No erythema. No pallor.  Psychiatric: She has a normal mood and affect. Her behavior is normal. Judgment and thought content normal.  Nursing note and vitals reviewed.  No results found. Depression screen [facility] 2/9 06/20/2017 02/27/2017 01/19/2017 12/25/2016 08/24/2016  Decreased Interest 0 0 0 0 0  Down, Depressed, Hopeless 0 0 0 0 0  PHQ - 2 Score 0 0 0 0 0   Fall Risk  06/20/2017 02/27/2017 01/19/2017 01/19/2017 12/25/2016  Falls in the past year? No No No No No  Number falls in past yr: - - - - -  Risk for fall due to : - - - - -        Assessment & Plan:   1. Routine physical  examination   2. Pure hypercholesterolemia   3. Mild intermittent asthma without complication   4. Seasonal allergic rhinitis due to pollen   5. Sciatica of left side   6. Degenerative lumbar disc   7. Osteopenia of multiple sites   8. Atrophic vaginitis   9. Psychophysiological insomnia     -anticipatory guidance provided --- exercise, weight loss, safe driving practices, aspirin 81mg  daily, calcium 600mg  twice daily or three servings of dairy daily. -obtain age appropriate screening labs and labs for chronic disease management. -moderate fall risk; no evidence of depression; no evidence of hearing loss.  Discussed advanced directives and living will; also discussed end of life issues including code status.  -Highly encourage patient to focus on improved pain control in the upcoming year so that quality of life is not negatively impacted.  Consider using tramadol once to twice daily.  I also discussed that there is an extended release tramadol to provide longer pain control throughout the day.  Patient leads a very active lifestyle and arthritis related pain is limiting her activity level.   Orders Placed This Encounter  Procedures  . CBC with Differential/Platelet  . Comprehensive metabolic panel    Order Specific Question:   Has the patient fasted?    Answer:   No  . Lipid panel    Order Specific Question:   Has the patient fasted?    Answer:   No  . POCT urinalysis dipstick   Meds ordered this encounter  Medications  . ALPRAZolam (XANAX) 0.5 MG tablet    Sig: Take 0.5 tablets (0.25 mg total) by mouth at bedtime as needed for anxiety.  Dispense:  30 tablet    Refill:  5    This request is for a new prescription for a controlled substance as required by Federal/State law.  . gabapentin (NEURONTIN) 300 MG capsule    Sig: Take 1-2 capsules (300-600 mg total) by mouth 3 (three) times daily as needed.    Dispense:  90 capsule    Refill:  5    No Follow-up on file.   Hopelynn Gartland  Elayne Guerin, M.D. Primary Care at [facility] previously [facility] [address] [place], [place]  [address] [phone] phone [phone] fax

## 2017-06-20 NOTE — Patient Instructions (Addendum)
   IF you received an x-ray today, you will receive an invoice from [facility]. Please contact [facility] at [phone] with questions or concerns regarding your invoice.   IF you received labwork today, you will receive an invoice from [facility]. Please contact [facility] at [phone] with questions or concerns regarding your invoice.   Our billing staff will not be able to assist you with questions regarding bills from these companies.  You will be contacted with the lab results as soon as they are available. The fastest way to get your results is to activate your My Chart account. Instructions are located on the last page of this paperwork. If you have not heard from us regarding the results in 2 weeks, please contact this office.      Preventive Care 65 Years and Older, Female Preventive care refers to lifestyle choices and visits with your health care provider that can promote health and wellness. What does preventive care include?  A yearly physical exam. This is also called an annual well check.  Dental exams once or twice a year.  Routine eye exams. Ask your health care provider how often you should have your eyes checked.  Personal lifestyle choices, including: ? Daily care of your teeth and gums. ? Regular physical activity. ? Eating a healthy diet. ? Avoiding tobacco and drug use. ? Limiting alcohol use. ? Practicing safe sex. ? Taking low-dose aspirin every day. ? Taking vitamin and mineral supplements as recommended by your health care provider. What happens during an annual well check? The services and screenings done by your health care provider during your annual well check will depend on your age, overall health, lifestyle risk factors, and family history of disease. Counseling Your health care provider may ask you questions about your:  Alcohol use.  Tobacco use.  Drug use.  Emotional well-being.  Home and relationship  well-being.  Sexual activity.  Eating habits.  History of falls.  Memory and ability to understand (cognition).  Work and work environment.  Reproductive health.  Screening You may have the following tests or measurements:  Height, weight, and BMI.  Blood pressure.  Lipid and cholesterol levels. These may be checked every 5 years, or more frequently if you are over 50 years old.  Skin check.  Lung cancer screening. You may have this screening every year starting at age 55 if you have a 30-pack-year history of smoking and currently smoke or have quit within the past 15 years.  Fecal occult blood test (FOBT) of the stool. You may have this test every year starting at age 50.  Flexible sigmoidoscopy or colonoscopy. You may have a sigmoidoscopy every 5 years or a colonoscopy every 10 years starting at age 50.  Hepatitis C blood test.  Hepatitis B blood test.  Sexually transmitted disease (STD) testing.  Diabetes screening. This is done by checking your blood sugar (glucose) after you have not eaten for a while (fasting). You may have this done every 1-3 years.  Bone density scan. This is done to screen for osteoporosis. You may have this done starting at age 65.  Mammogram. This may be done every 1-2 years. Talk to your health care provider about how often you should have regular mammograms.  Talk with your health care provider about your test results, treatment options, and if necessary, the need for more tests. Vaccines Your health care provider may recommend certain vaccines, such as:  Influenza vaccine. This is recommended every year.    Tetanus, diphtheria, and acellular pertussis (Tdap, Td) vaccine. You may need a Td booster every 10 years.  Varicella vaccine. You may need this if you have not been vaccinated.  Zoster vaccine. You may need this after age 60.  Measles, mumps, and rubella (MMR) vaccine. You may need at least one dose of MMR if you were born in  1957 or later. You may also need a second dose.  Pneumococcal 13-valent conjugate (PCV13) vaccine. One dose is recommended after age 65.  Pneumococcal polysaccharide (PPSV23) vaccine. One dose is recommended after age 65.  Meningococcal vaccine. You may need this if you have certain conditions.  Hepatitis A vaccine. You may need this if you have certain conditions or if you travel or work in places where you may be exposed to hepatitis A.  Hepatitis B vaccine. You may need this if you have certain conditions or if you travel or work in places where you may be exposed to hepatitis B.  Haemophilus influenzae type b (Hib) vaccine. You may need this if you have certain conditions.  Talk to your health care provider about which screenings and vaccines you need and how often you need them. This information is not intended to replace advice given to you by your health care provider. Make sure you discuss any questions you have with your health care provider. Document Released: 07/09/2015 Document Revised: 03/01/2016 Document Reviewed: 04/13/2015 Elsevier Interactive Patient Education  2018 [employer]  

## 2017-06-21 LAB — CBC WITH DIFFERENTIAL/PLATELET
Basophils Absolute: 0 10*3/uL (ref 0.0–0.2)
Basos: 0 %
EOS (ABSOLUTE): 0.1 10*3/uL (ref 0.0–0.4)
EOS: 2 %
HEMATOCRIT: 37.6 % (ref 34.0–46.6)
HEMOGLOBIN: 12.7 g/dL (ref 11.1–15.9)
IMMATURE GRANS (ABS): 0 10*3/uL (ref 0.0–0.1)
Immature Granulocytes: 0 %
LYMPHS ABS: 0.9 10*3/uL (ref 0.7–3.1)
Lymphs: 21 %
MCH: 32.6 pg (ref 26.6–33.0)
MCHC: 33.8 g/dL (ref 31.5–35.7)
MCV: 97 fL (ref 79–97)
MONOCYTES: 9 %
Monocytes Absolute: 0.4 10*3/uL (ref 0.1–0.9)
NEUTROS ABS: 3 10*3/uL (ref 1.4–7.0)
Neutrophils: 68 %
Platelets: 273 10*3/uL (ref 150–379)
RBC: 3.89 x10E6/uL (ref 3.77–5.28)
RDW: 13.7 % (ref 12.3–15.4)
WBC: 4.4 10*3/uL (ref 3.4–10.8)

## 2017-06-21 LAB — COMPREHENSIVE METABOLIC PANEL
ALBUMIN: 4.2 g/dL (ref 3.5–4.8)
ALK PHOS: 41 IU/L (ref 39–117)
ALT: 24 IU/L (ref 0–32)
AST: 25 IU/L (ref 0–40)
Albumin/Globulin Ratio: 1.6 (ref 1.2–2.2)
BILIRUBIN TOTAL: 0.2 mg/dL (ref 0.0–1.2)
BUN / CREAT RATIO: 16 (ref 12–28)
BUN: 15 mg/dL (ref 8–27)
CHLORIDE: 102 mmol/L (ref 96–106)
CO2: 27 mmol/L (ref 20–29)
Calcium: 9.5 mg/dL (ref 8.7–10.3)
Creatinine, Ser: 0.95 mg/dL (ref 0.57–1.00)
GFR calc Af Amer: 70 mL/min/{1.73_m2} (ref >59)
GFR calc non Af Amer: 60 mL/min/{1.73_m2} (ref >59)
GLOBULIN, TOTAL: 2.6 g/dL (ref 1.5–4.5)
Glucose: 91 mg/dL (ref 65–99)
POTASSIUM: 4.2 mmol/L (ref 3.5–5.2)
SODIUM: 142 mmol/L (ref 134–144)
Total Protein: 6.8 g/dL (ref 6.0–8.5)

## 2017-06-21 LAB — LIPID PANEL
CHOLESTEROL TOTAL: 191 mg/dL (ref 100–199)
Chol/HDL Ratio: 2.9 ratio (ref 0.0–4.4)
HDL: 67 mg/dL (ref >39)
LDL CALC: 112 mg/dL — AB (ref 0–99)
TRIGLYCERIDES: 62 mg/dL (ref 0–149)
VLDL Cholesterol Cal: 12 mg/dL (ref 5–40)

## 2017-06-23 ENCOUNTER — Encounter: Payer: Self-pay | Admitting: Emergency Medicine

## 2017-06-23 ENCOUNTER — Ambulatory Visit: Payer: Medicare Other | Admitting: Emergency Medicine

## 2017-06-23 VITALS — BP 98/60 | HR 67 | Temp 98.7°F | Ht 64.37 in | Wt 136.1 lb

## 2017-06-23 DIAGNOSIS — S61213A Laceration without foreign body of left middle finger without damage to nail, initial encounter: Secondary | ICD-10-CM | POA: Diagnosis not present

## 2017-06-23 NOTE — Progress Notes (Signed)
Kristin Tapia 71 y.o.   Chief Complaint  Patient presents with  . Finger Laceration    HISTORY OF PRESENT ILLNESS: This is a 71 y.o. female complaining of finger laceration sustained today with a knife.  HPI   Prior to Admission medications   Medication Sig Start Date End Date Taking? Authorizing Provider  acetaminophen (TYLENOL) 325 MG tablet Take 750 mg by mouth 2 (two) times daily.    Yes [provider]  ALPRAZolam Duanne Moron) 0.5 MG tablet Take 0.5 tablets (0.25 mg total) by mouth at bedtime as needed for anxiety. 06/20/17  Yes Wardell Honour, MD  Calcium Carbonate-Vitamin D (CALCIUM 600+D3) 600-400 MG-UNIT per tablet Take 1 tablet by mouth daily.    Yes [provider]  cholecalciferol (VITAMIN D) 400 UNITS TABS Take 2,000 Units by mouth daily.    Yes [provider]  clobetasol (TEMOVATE) 0.05 % external solution APPLY TO AFFECTED AREA 1 TO 3 TIMES A DAY FOR 10 DAYS 04/20/17  Yes [provider]  cycloSPORINE (RESTASIS) 0.05 % ophthalmic emulsion Place 1 drop into both eyes once.    Yes [provider]  Estradiol 10 MCG TABS vaginal tablet Place 1 tablet (10 mcg total) vaginally 2 (two) times a week. Patient taking differently: Place 1 each vaginally 2 (two) times a week.  06/15/16  Yes Terrance Mass, MD  fexofenadine (ALLEGRA) 180 MG tablet Take 180 mg by mouth every morning.   Yes [provider]  gabapentin (NEURONTIN) 300 MG capsule Take 1-2 capsules (300-600 mg total) by mouth 3 (three) times daily as needed. 06/20/17  Yes Wardell Honour, MD  ipratropium (ATROVENT) 0.03 % nasal spray Place 2 sprays into the nose 3 (three) times daily.   Yes [provider]  lidocaine (LMX) 4 % cream Apply 1 application topically as needed.   Yes [provider]  MAGNESIUM GLYCINATE PLUS PO Take 400 mg by mouth.   Yes [provider]  meloxicam (MOBIC) 7.5 MG tablet Take 1 tablet (7.5 mg total) by mouth 2  (two) times daily. Patient taking differently: Take 7.5 mg by mouth every other day.  01/25/17  Yes Stefanie Libel, MD  Omega-3 Fatty Acids (FISH OIL) 1200 MG CAPS Take 1,200 mg by mouth daily. Patient takes 2 gel caps daily   Yes [provider]  terconazole (TERAZOL 7) 0.4 % vaginal cream Apply qhs for 1 week then once a week 12/20/16  Yes Terrance Mass, MD  traMADol (ULTRAM) 50 MG tablet Take 50 mg by mouth every 8 (eight) hours as needed. 05/30/17  Yes [provider]    Allergies  Allergen Reactions  . Sulfonamide Derivatives     Patient Active Problem List   Diagnosis Date Noted  . Degenerative lumbar disc 11/02/2016  . Psychophysiological insomnia 06/26/2016  . Atrophic vaginitis 05/04/2016  . Vaginal moniliasis 05/04/2016  . Seasonal allergic rhinitis due to pollen 05/09/2015  . Asthma, mild intermittent 05/09/2015  . Right hamstring muscle strain 01/12/2015  . Tendinopathy of right rotator cuff 12/10/2014  . Primary osteoarthritis involving multiple joints 05/23/2014  . Osteopenia 05/23/2014  . Right tennis elbow 03/05/2014  . Pain in joint, ankle and foot 05/06/2013  . Left hip pain 01/17/2013  . Sciatica of left side 01/17/2010  . Enthesopathy of ankle and tarsus 03/11/2009  . Cavus deformity of foot 03/11/2009    Past Medical History:  Diagnosis Date  . Abnormality of gait 03/11/2009  . Allergy  immunotherapy/[place].  Marland Kitchen Anxiety   . Arthritis   . Asthma    childhood  . Cataract   . Cavus deformity of foot, acquired 03/11/2009  . Cystocele   . Detached retina   . GERD (gastroesophageal reflux disease)   . Hip pain 01/17/2013  . Metatarsalgia 03/11/2009  . Osteoporosis   . Postmenopausal atrophic vaginitis   . Rectocele   . Restless leg syndrome    L leg; s/p consultation by Dohmeier/Neurology.  . Sciatica of left side 01/17/2010  . Squamous cell carcinoma of skin 01/25/2015   L shoulder  . Thyroid disease     Past Surgical  History:  Procedure Laterality Date  . APPENDECTOMY    . COLONOSCOPY  04/2009   Int & Ext Hemorrhoids, L sided diverticula.  [place].  Repeat 5 years  . ESOPHAGOGASTRODUODENOSCOPY  04/2009   tiny HH, minimal antritis, gastric polyps. [facility].  Marland Kitchen EYE SURGERY    . MYOMECTOMY     [place].  . TONSILLECTOMY      Social History   Socioeconomic History  . Marital status: Married    Spouse name: Not on file  . Number of children: 2  . Years of education: Not on file  . Highest education level: Not on file  Social Needs  . Financial resource strain: Not on file  . Food insecurity - worry: Not on file  . Food insecurity - inability: Not on file  . Transportation needs - medical: Not on file  . Transportation needs - non-medical: Not on file  Occupational History  . 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  Tobacco Use  . Smoking status: Never Smoker  . Smokeless tobacco: Never Used  Substance and Sexual Activity  . Alcohol use: No  . Drug use: No  . Sexual activity: Yes    Partners: Male    Birth control/protection: Post-menopausal  Other Topics Concern  . Not on file  Social History Narrative   Marital status:  Married x 48 years; happily married      Children:  2 children; 1 grandchild Berline Lopes)      Lives: with husband.      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      Tobacco: never      Alcohol: none      Drugs: none      Exercise: daily.  3-4 miles walking daily; water aerobics three days per week warm water.  1-5-2.0 hours per day. Walks atleast 10,000 steps per day; warm water exercises three days per week.      Advanced Directives:  +Living Will; FULL CODE; no prolonged measures.       ADLs: independent with all ADLs; no assistant devices for ambulation.    Family History  Problem Relation Age of Onset  . Arthritis Mother        mild  . Hypertension Mother    . Dementia Mother   . Cancer Mother 31       breast cancer  . Hyperlipidemia Mother   . Heart disease Mother   . COPD Father   . Stroke Father   . Heart disease Father   . Seizures Brother   . Cancer Brother   . Osteoporosis Neg Hx      Review of Systems  Skin:       +laceration    Vitals:  06/23/17 1004  BP: 98/60  Pulse: 67  Temp: 98.7 F (37.1 C)  SpO2: 96%    Physical Exam  Constitutional: She is oriented to person, place, and time. She appears well-developed and well-nourished.  HENT:  Head: Normocephalic.  Eyes: Pupils are equal, round, and reactive to light.  Neck: Normal range of motion.  Cardiovascular: Normal rate.  Pulmonary/Chest: Effort normal.  Musculoskeletal:  Left middle finger: NVI with FROM; vertical 2.5 cm long distal laceration to palmar aspect.  Neurological: She is alert and oriented to person, place, and time.  Skin: Skin is warm and dry. Capillary refill takes less than 2 seconds.  Psychiatric: She has a normal mood and affect. Her behavior is normal.  Vitals reviewed.    ASSESSMENT & PLAN: Jazira was seen today for finger laceration.  Diagnoses and all orders for this visit:  Laceration of left middle finger without foreign body without damage to nail, initial encounter    Patient Instructions  Laceration Care, Adult A laceration is a cut that goes through all layers of the skin. The cut also goes into the tissue that is right under the skin. Some cuts heal on their own. Others need to be closed with stitches (sutures), staples, skin adhesive strips, or wound glue. Taking care of your cut lowers your risk of infection and helps your cut to heal better. How to take care of your cut For stitches or staples:  Keep the wound clean and dry.  If you were given a bandage (dressing), you should change it at least one time per day or as told by your doctor. You should also change it if it gets wet or dirty.  Keep the wound completely  dry for the first 24 hours or as told by your doctor. After that time, you may take a shower or a bath. However, make sure that the wound is not soaked in water until after the stitches or staples have been removed.  Clean the wound one time each day or as told by your doctor: ? Wash the wound with soap and water. ? Rinse the wound with water until all of the soap comes off. ? Pat the wound dry with a clean towel. Do not rub the wound.  After you clean the wound, put a thin layer of antibiotic ointment on it as told by your doctor. This ointment: ? Helps to prevent infection. ? Keeps the bandage from sticking to the wound.  Have your stitches or staples removed as told by your doctor. If your doctor used skin adhesive strips:  Keep the wound clean and dry.  If you were given a bandage, you should change it at least one time per day or as told by your doctor. You should also change it if it gets dirty or wet.  Do not get the skin adhesive strips wet. You can take a shower or a bath, but be careful to keep the wound dry.  If the wound gets wet, pat it dry with a clean towel. Do not rub the wound.  Skin adhesive strips fall off on their own. You can trim the strips as the wound heals. Do not remove any strips that are still stuck to the wound. They will fall off after a while. If your doctor used wound glue:  Try to keep your wound dry, but you may briefly wet it in the shower or bath. Do not soak the wound in water, such as by swimming.  After you  take a shower or a bath, gently pat the wound dry with a clean towel. Do not rub the wound.  Do not do any activities that will make you really sweaty until the skin glue has fallen off on its own.  Do not apply liquid, cream, or ointment medicine to your wound while the skin glue is still on.  If you were given a bandage, you should change it at least one time per day or as told by your doctor. You should also change it if it gets dirty or  wet.  If a bandage is placed over the wound, do not let the tape for the bandage touch the skin glue.  Do not pick at the glue. The skin glue usually stays on for 5-10 days. Then, it falls off of the skin. General Instructions  To help prevent scarring, make sure to cover your wound with sunscreen whenever you are outside after stitches are removed, after adhesive strips are removed, or when wound glue stays in place and the wound is healed. Make sure to wear a sunscreen of at least 30 SPF.  Take over-the-counter and prescription medicines only as told by your doctor.  If you were given antibiotic medicine or ointment, take or apply it as told by your doctor. Do not stop using the antibiotic even if your wound is getting better.  Do not scratch or pick at the wound.  Keep all follow-up visits as told by your doctor. This is important.  Check your wound every day for signs of infection. Watch for: ? Redness, swelling, or pain. ? Fluid, blood, or pus.  Raise (elevate) the injured area above the level of your heart while you are sitting or lying down, if possible. Get help if:  You got a tetanus shot and you have any of these problems at the injection site: ? Swelling. ? Very bad pain. ? Redness. ? Bleeding.  You have a fever.  A wound that was closed breaks open.  You notice a bad smell coming from your wound or your bandage.  You notice something coming out of the wound, such as wood or glass.  Medicine does not help your pain.  You have more redness, swelling, or pain at the site of your wound.  You have fluid, blood, or pus coming from your wound.  You notice a change in the color of your skin near your wound.  You need to change the bandage often because fluid, blood, or pus is coming from the wound.  You start to have a new rash.  You start to have numbness around the wound. Get help right away if:  You have very bad swelling around the wound.  Your pain  suddenly gets worse and is very bad.  You notice painful lumps near the wound or on skin that is anywhere on your body.  You have a red streak going away from your wound.  The wound is on your hand or foot and you cannot move a finger or toe like you usually can.  The wound is on your hand or foot and you notice that your fingers or toes look pale or bluish. This information is not intended to replace advice given to you by your health care provider. Make sure you discuss any questions you have with your health care provider. Document Released: 11/29/2007 Document Revised: 11/18/2015 Document Reviewed: 06/08/2014 Elsevier Interactive Patient Education  2018 Elsevier Inc.      Agustina Caroli, MD [facility]

## 2017-06-23 NOTE — Patient Instructions (Signed)

## 2017-07-09 ENCOUNTER — Encounter: Payer: Self-pay | Admitting: Family Medicine

## 2017-08-06 ENCOUNTER — Other Ambulatory Visit: Payer: Self-pay | Admitting: Family Medicine

## 2017-08-06 NOTE — Telephone Encounter (Signed)
Refill of tramadol  LOV 06/23/17  with Dr. Mitchel Honour  [facility] 05/30/17   #60   2 refills  To be filled at: [id number] - [place], [place]

## 2017-08-09 NOTE — Telephone Encounter (Signed)
Copied from [place]. Topic: Inquiry >> Aug 09, 2017 10:24 AM Neva Seat wrote: Tramadol 50 mg  Pt has 2 left on hand - has been waiting since Mon for refill.   [id number] [address] [place] [address] Phone: [phone] Fax: [phone]

## 2017-08-09 NOTE — Telephone Encounter (Signed)
Ultram refill request - controlled substance Last OV 12/26 with [facility] [address], [place].

## 2017-09-02 ENCOUNTER — Ambulatory Visit (HOSPITAL_COMMUNITY)
Admission: EM | Admit: 2017-09-02 | Discharge: 2017-09-02 | Disposition: A | Discharge status: Home / Self Care | Payer: Medicare Other

## 2017-09-02 ENCOUNTER — Other Ambulatory Visit: Payer: Self-pay

## 2017-09-02 ENCOUNTER — Encounter (HOSPITAL_COMMUNITY): Payer: Self-pay | Admitting: *Deleted

## 2017-09-02 DIAGNOSIS — Z23 Encounter for immunization: Secondary | ICD-10-CM | POA: Diagnosis not present

## 2017-09-02 DIAGNOSIS — S61012A Laceration without foreign body of left thumb without damage to nail, initial encounter: Secondary | ICD-10-CM | POA: Diagnosis not present

## 2017-09-02 DIAGNOSIS — W260XXA Contact with knife, initial encounter: Secondary | ICD-10-CM | POA: Diagnosis not present

## 2017-09-02 MED ORDER — TETANUS-DIPHTH-ACELL PERTUSSIS 5-2.5-18.5 LF-MCG/0.5 IM SUSP
0.5000 mL | Freq: Once | INTRAMUSCULAR | Status: AC
  Administered 2017-09-02: 0.5 mL via INTRAMUSCULAR

## 2017-09-02 MED ORDER — TETANUS-DIPHTH-ACELL PERTUSSIS 5-2.5-18.5 LF-MCG/0.5 IM SUSP
INTRAMUSCULAR | Status: AC
  Filled 2017-09-02: qty 0.5

## 2017-09-02 NOTE — ED Triage Notes (Signed)
Reports cutting left thumb @ approx 0900 this AM while cutting food.  Denies any numbness or tingling.  Continues to bleed per pt.

## 2017-09-02 NOTE — ED Provider Notes (Signed)
09/02/2017 4:07 PM   DOB: 01/02/1946 / MRN: [id number]  SUBJECTIVE:  Kristin Tapia is a 72 y.o. female presenting for laceration to the left medial distal thumb.  She was cutting up some vegetables and tells me that the knife slipped and cut her finger.  She is allergic to sulfonamide derivatives.   She  has a past medical history of Abnormality of gait (03/11/2009), Allergy, Anxiety, Arthritis, Asthma, Cataract, Cavus deformity of foot, acquired (03/11/2009), Cystocele, Detached retina, GERD (gastroesophageal reflux disease), Hip pain (01/17/2013), Metatarsalgia (03/11/2009), Osteoporosis, Postmenopausal atrophic vaginitis, Rectocele, Restless leg syndrome, Sciatica of left side (01/17/2010), Squamous cell carcinoma of skin (01/25/2015), and Thyroid disease.    She  reports that  has never smoked. she has never used smokeless tobacco. She reports that she does not drink alcohol or use drugs. She  has no sexual activity history on file. The patient  has a past surgical history that includes Appendectomy; Colonoscopy (04/2009); Esophagogastroduodenoscopy (04/2009); Tonsillectomy; Myomectomy; and Eye surgery.  Her family history includes Arthritis in her mother; COPD in her father; Cancer in her brother; Cancer (age of onset: 69) in her mother; Dementia in her mother; Heart disease in her father and mother; Hyperlipidemia in her mother; Hypertension in her mother; Seizures in her brother; Stroke in her father.  Review of Systems  Neurological: Negative for focal weakness.  Endo/Heme/Allergies: Does not bruise/bleed easily.    OBJECTIVE:  BP 123/64   Pulse 70   Temp 98.3 F (36.8 C) (Oral)   Resp 16   SpO2 100%   Physical Exam  Constitutional: She is active.  Non-toxic appearance.  Cardiovascular: Normal rate.  Pulmonary/Chest: Effort normal. No tachypnea.  Musculoskeletal:       Hands: Neurological: She is alert.  Skin: Skin is warm and dry. She is not diaphoretic. No pallor.      Risk and benefits discussed and verbal consent obtained.  Patient anesthetized with 1% lidocaine without epinephrine.  The wound was washed with soap and water.  Sterile drapes were placed.  2 horizontal mattress sutures using 4-0 Ethilon suture material for wound closure.  Wound well approximated and hemostasis largely achieved.  Patient tolerated the procedure without difficulty.  No results found for this or any previous visit (from the past 72 hour(s)).  No results found.  ASSESSMENT AND PLAN:  No orders of the defined types were placed in this encounter.    Laceration of left thumb without foreign body without damage to nail, initial encounter: Mild injury however given the location it will heal more quickly and with reduced risk of infection with suture.  Her daughter is a Journalist, newspaper and Ms. Hemberger will seek her care for suture removal.  Advised that if she has any complications with the plan to please come back in the next 10 days for further evaluation.      The patient is advised to call or return to clinic if she does not see an improvement in symptoms, or to seek the care of the closest emergency department if she worsens with the above plan.   Philis Fendt, MHS, PA-C 09/02/2017 4:07 PM    Tereasa Coop, PA-C 09/02/17 1609

## 2017-09-02 NOTE — Discharge Instructions (Signed)
Keep the wound covered for the next 24 hours.  After that it is reasonable just to wash the wound with soap and water once to twice a day.  Okay to keep the wound covered for longer if that makes you feel comfortable.  Please come back in about 10 days for suture removal if needed.

## 2017-11-19 ENCOUNTER — Encounter: Payer: Self-pay | Admitting: Family Medicine

## 2017-11-21 ENCOUNTER — Encounter: Payer: Self-pay | Admitting: Family Medicine

## 2017-11-27 ENCOUNTER — Other Ambulatory Visit: Payer: Self-pay | Admitting: Family Medicine

## 2017-12-14 ENCOUNTER — Encounter: Payer: Self-pay | Admitting: Family Medicine

## 2018-01-16 ENCOUNTER — Other Ambulatory Visit: Payer: Self-pay

## 2018-01-16 ENCOUNTER — Encounter: Payer: Self-pay | Admitting: Family Medicine

## 2018-01-16 ENCOUNTER — Ambulatory Visit: Payer: Medicare Other | Admitting: Family Medicine

## 2018-01-16 VITALS — BP 126/72 | HR 75 | Temp 98.8°F | Resp 16 | Ht 64.0 in | Wt 138.2 lb

## 2018-01-16 DIAGNOSIS — N3001 Acute cystitis with hematuria: Secondary | ICD-10-CM | POA: Diagnosis not present

## 2018-01-16 DIAGNOSIS — R300 Dysuria: Secondary | ICD-10-CM | POA: Diagnosis not present

## 2018-01-16 LAB — POCT URINALYSIS DIP (MANUAL ENTRY)
BILIRUBIN UA: NEGATIVE mg/dL
Bilirubin, UA: NEGATIVE
Glucose, UA: NEGATIVE mg/dL
NITRITE UA: NEGATIVE
PROTEIN UA: NEGATIVE mg/dL
Spec Grav, UA: <=1.005 — AB (ref 1.010–1.025)
Urobilinogen, UA: 0.2 E.U./dL
pH, UA: 5.5 (ref 5.0–8.0)

## 2018-01-16 MED ORDER — FLUCONAZOLE 150 MG PO TABS: 150.0000 mg | ORAL_TABLET | Freq: Once | ORAL | 0 refills | Status: AC

## 2018-01-16 MED ORDER — AMOXICILLIN-POT CLAVULANATE 875-125 MG PO TABS: 1.0000 | ORAL_TABLET | Freq: Two times a day (BID) | ORAL | 0 refills | Status: DC

## 2018-01-16 NOTE — Progress Notes (Signed)
Subjective:    Patient ID: Kristin Tapia, female    DOB: 1946-05-26, 72 y.o.   MRN: [id number]  01/16/2018  Dysuria (pt states she woke up this morning and notices burning and blood in her urine. ) and Urinary Frequency    HPI This 72 y.o. female presents for evaluation of UTI symptoms.   URGENCY. HEMATURIA, DYSURIA.  No fever.  No chills/sweats. nuasea or vomiting; no back pain. No vaginal irritation.  No vaginal discharge.   BP Readings from Last 3 Encounters:  01/16/18 126/72  09/02/17 123/64  06/23/17 98/60   Wt Readings from Last 3 Encounters:  01/16/18 138 lb 3.2 oz (62.7 kg)  06/23/17 136 lb 2 oz (61.7 kg)  06/20/17 134 lb 6.4 oz (61 kg)   Immunization History  Administered Date(s) Administered  . Hepatitis A 09/09/2012  . Hepatitis A, Adult 04/16/2013  . Hepatitis B 09/09/2012, 10/14/2012  . Hepatitis B, adult 04/16/2013  . Influenza Split 03/26/2012  . Influenza,inj,Quad PF,6+ Mos 04/16/2013, 03/16/2014, 03/11/2015, 02/27/2017  . Influenza-Unspecified 02/09/2016  . Pneumococcal Conjugate-13 05/18/2014  . Pneumococcal Polysaccharide-23 03/03/2011  . Td 07/13/2008  . Tdap 09/02/2017  . Zoster 08/23/2007  . Zoster Recombinat (Shingrix) 12/27/2016, 02/24/2017    Review of Systems  Constitutional: Negative for chills, diaphoresis, fatigue and fever.  Eyes: Negative for visual disturbance.  Respiratory: Negative for cough and shortness of breath.   Cardiovascular: Negative for chest pain, palpitations and leg swelling.  Gastrointestinal: Negative for abdominal pain, constipation, diarrhea, nausea and vomiting.  Endocrine: Negative for cold intolerance, heat intolerance, polydipsia, polyphagia and polyuria.  Genitourinary: Positive for dysuria, frequency, hematuria and urgency. Negative for decreased urine volume, difficulty urinating, dyspareunia, enuresis, flank pain, genital sores, menstrual problem, pelvic pain, vaginal bleeding, vaginal discharge and  vaginal pain.  Neurological: Negative for dizziness, tremors, seizures, syncope, facial asymmetry, speech difficulty, weakness, light-headedness, numbness and headaches.    Past Medical History:  Diagnosis Date  . Abnormality of gait 03/11/2009  . Allergy    immunotherapy/[facility] [place].  Marland Kitchen Anxiety   . Arthritis   . Asthma    childhood  . Cataract   . Cavus deformity of foot, acquired 03/11/2009  . Cystocele   . Detached retina   . GERD (gastroesophageal reflux disease)   . Hip pain 01/17/2013  . Metatarsalgia 03/11/2009  . Osteoporosis   . Postmenopausal atrophic vaginitis   . Rectocele   . Restless leg syndrome    L leg; s/p consultation by Dohmeier/Neurology.  . Sciatica of left side 01/17/2010  . Squamous cell carcinoma of skin 01/25/2015   L shoulder  . Thyroid disease    Past Surgical History:  Procedure Laterality Date  . APPENDECTOMY    . COLONOSCOPY  04/2009   Int & Ext Hemorrhoids, L sided diverticula.  Magod.  Repeat 5 years  . ESOPHAGOGASTRODUODENOSCOPY  04/2009   tiny HH, minimal antritis, gastric polyps. Magod.  Marland Kitchen EYE SURGERY    . MYOMECTOMY     [place].  . TONSILLECTOMY     Allergies  Allergen Reactions  . Sulfonamide Derivatives    Current Outpatient Medications on File Prior to Visit  Medication Sig Dispense Refill  . acetaminophen (TYLENOL) 325 MG tablet Take 750 mg by mouth 2 (two) times daily.     Marland Kitchen ALPRAZolam (XANAX) 0.5 MG tablet Take 0.5 tablets (0.25 mg total) by mouth at bedtime as needed for anxiety. 30 tablet 5  . Calcium Carbonate-Vitamin D (CALCIUM 600+D3) 600-400 MG-UNIT per tablet Take  1 tablet by mouth daily.     . cholecalciferol (VITAMIN D) 400 UNITS TABS Take 2,000 Units by mouth daily.     . clobetasol (TEMOVATE) 0.05 % external solution APPLY TO AFFECTED AREA 1 TO 3 TIMES A DAY FOR 10 DAYS  1  . cycloSPORINE (RESTASIS) 0.05 % ophthalmic emulsion Place 1 drop into both eyes once.     Marland Kitchen estradiol (ESTRACE) 0.1 MG/GM vaginal  cream Place 1 Applicatorful vaginally at bedtime.    . Estradiol 10 MCG TABS vaginal tablet Place 1 tablet (10 mcg total) vaginally 2 (two) times a week. (Patient taking differently: Place 1 each vaginally 2 (two) times a week. ) 8 tablet 11  . fexofenadine (ALLEGRA) 180 MG tablet Take 180 mg by mouth every morning.    . gabapentin (NEURONTIN) 300 MG capsule TAKE 1 TO 2 CAPSULES (300-600 MG TOTAL) BY MOUTH 3 (THREE) TIMES DAILY AS NEEDED. 90 capsule 5  . ipratropium (ATROVENT) 0.03 % nasal spray Place 2 sprays into the nose 3 (three) times daily.    Marland Kitchen lidocaine (LMX) 4 % cream Apply 1 application topically as needed.    Marland Kitchen MAGNESIUM GLYCINATE PLUS PO Take 400 mg by mouth.    . meloxicam (MOBIC) 7.5 MG tablet Take 1 tablet (7.5 mg total) by mouth 2 (two) times daily. (Patient taking differently: Take 7.5 mg by mouth every other day. ) 60 tablet 3  . Omega-3 Fatty Acids (FISH OIL) 1200 MG CAPS Take 1,200 mg by mouth daily. Patient takes 2 gel caps daily    . terconazole (TERAZOL 7) 0.4 % vaginal cream Apply qhs for 1 week then once a week 45 g 6  . traMADol (ULTRAM) 50 MG tablet TAKE 1 TABLET BY MOUTH EVERY 8 HOURS AS NEEDED. 60 tablet 5  . TURMERIC CURCUMIN PO Take by mouth.     No current facility-administered medications on file prior to visit.    Social History   Socioeconomic History  . Marital status: Married    Spouse name: Not on file  . Number of children: 2  . Years of education: Not on file  . Highest education level: Not on file  Occupational History  . 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  Social Needs  . Financial resource strain: Not on file  . Food insecurity:    Worry: Not on file    Inability: Not on file  . Transportation needs:    Medical: Not on file    Non-medical: Not on file  Tobacco Use  . Smoking status: Never Smoker  . Smokeless tobacco: Never Used  Substance and Sexual Activity  . Alcohol use:  No  . Drug use: No  . Sexual activity: Not on file  Lifestyle  . Physical activity:    Days per week: Not on file    Minutes per session: Not on file  . Stress: Not on file  Relationships  . Social connections:    Talks on phone: Not on file    Gets together: Not on file    Attends religious service: Not on file    Active member of club or organization: Not on file    Attends meetings of clubs or organizations: Not on file    Relationship status: Not on file  . Intimate partner violence:    Fear of current or ex partner: Not on file    Emotionally abused: Not on file  Physically abused: Not on file    Forced sexual activity: Not on file  Other Topics Concern  . Not on file  Social History Narrative   Marital status:  Married x 48 years; happily married      Children:  2 children; 1 grandchild Berline Lopes)      Lives: with husband.      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      Tobacco: never      Alcohol: none      Drugs: none      Exercise: daily.  3-4 miles walking daily; water aerobics three days per week warm water.  1-5-2.0 hours per day. Walks atleast 10,000 steps per day; warm water exercises three days per week.      Advanced Directives:  +Living Will; FULL CODE; no prolonged measures.       ADLs: independent with all ADLs; no assistant devices for ambulation.   Family History  Problem Relation Age of Onset  . Arthritis Mother        mild  . Hypertension Mother   . Dementia Mother   . Cancer Mother 5       breast cancer  . Hyperlipidemia Mother   . Heart disease Mother   . COPD Father   . Stroke Father   . Heart disease Father   . Seizures Brother   . Cancer Brother   . Osteoporosis Neg Hx        Objective:    BP 126/72   Pulse 75   Temp 98.8 F (37.1 C) (Oral)   Resp 16   Ht 5\' 4"  (1.626 m)   Wt 138 lb 3.2 oz (62.7 kg)   SpO2 98%   BMI 23.72 kg/m  Physical Exam  Constitutional:  She is oriented to person, place, and time. She appears well-developed and well-nourished. No distress.  HENT:  Head: Normocephalic and atraumatic.  Nose: Nose normal.  Mouth/Throat: Oropharynx is clear and moist.  Eyes: Pupils are equal, round, and reactive to light. Conjunctivae and EOM are normal.  Neck: Normal range of motion. Neck supple. Carotid bruit is not present. No thyromegaly present.  Cardiovascular: Normal rate, regular rhythm, normal heart sounds and intact distal pulses. Exam reveals no gallop and no friction rub.  No murmur heard. Pulmonary/Chest: Effort normal and breath sounds normal. She has no wheezes. She has no rales.  Abdominal: Soft. Bowel sounds are normal. She exhibits no distension and no mass. There is tenderness in the suprapubic area. There is no rebound, no guarding and no CVA tenderness. No hernia.  Lymphadenopathy:    She has no cervical adenopathy.  Neurological: She is alert and oriented to person, place, and time. No cranial nerve deficit.  Skin: Skin is warm and dry. No rash noted. She is not diaphoretic. No erythema. No pallor.  Psychiatric: She has a normal mood and affect. Her behavior is normal.   No results found. Depression screen [facility] 2/9 01/16/2018 06/20/2017 02/27/2017 01/19/2017 12/25/2016  Decreased Interest 0 0 0 0 0  Down, Depressed, Hopeless 0 0 0 0 0  PHQ - 2 Score 0 0 0 0 0   Fall Risk  01/16/2018 06/20/2017 02/27/2017 01/19/2017 01/19/2017  Falls in the past year? No No No No No  Number falls in past yr: - - - - -  Risk for fall due to : - - - - -   Results for orders  placed or performed in visit on 01/16/18  Urine Microscopic  Result Value Ref Range   WBC, UA >30 (A) 0 - 5 /hpf   RBC, UA 11-30 (A) 0 - 2 /hpf   Epithelial Cells (non renal) 0-10 0 - 10 /hpf   Casts None seen None seen /lpf   Bacteria, UA Few None seen/Few  POCT urinalysis dipstick  Result Value Ref Range   Color, UA yellow yellow   Clarity, UA cloudy (A) clear    Glucose, UA negative negative mg/dL   Bilirubin, UA negative negative   Ketones, POC UA negative negative mg/dL   Spec Grav, UA <=1.005 (A) 1.010 - 1.025   Blood, UA moderate (A) negative   pH, UA 5.5 5.0 - 8.0   Protein Ur, POC negative negative mg/dL   Urobilinogen, UA 0.2 0.2 or 1.0 E.U./dL   Nitrite, UA Negative Negative   Leukocytes, UA Small (1+) (A) Negative        Assessment & Plan:   1. Dysuria   2. Acute cystitis with hematuria     New onset. With gross hematuria to suggest cystitis.  Treat with Augmentin; send urine culture.  RTC for fever, vomiting, flank pain, recurrent hematuria.   Orders Placed This Encounter  Procedures  . Urine Culture  . Urine Microscopic  . POCT urinalysis dipstick   Meds ordered this encounter  Medications  . amoxicillin-clavulanate (AUGMENTIN) 875-125 MG tablet    Sig: Take 1 tablet by mouth 2 (two) times daily.    Dispense:  14 tablet    Refill:  0  . fluconazole (DIFLUCAN) 150 MG tablet    Sig: Take 1 tablet (150 mg total) by mouth once for 1 dose. Repeat if needed    Dispense:  2 tablet    Refill:  0    Return if symptoms worsen or fail to improve.   Trigg Delarocha Elayne Guerin, M.D. Primary Care at [facility] previously [facility] [place] [address] [place], [place]  [address] [phone] phone [phone] fax

## 2018-01-16 NOTE — Patient Instructions (Addendum)
     IF you received an x-ray today, you will receive an invoice from [facility]. Please contact [facility] at [phone] with questions or concerns regarding your invoice.   IF you received labwork today, you will receive an invoice from [facility]. Please contact [facility] at [phone] with questions or concerns regarding your invoice.   Our billing staff will not be able to assist you with questions regarding bills from these companies.  You will be contacted with the lab results as soon as they are available. The fastest way to get your results is to activate your My Chart account. Instructions are located on the last page of this paperwork. If you have not heard from us regarding the results in 2 weeks, please contact this office.     Urinary Tract Infection, Adult A urinary tract infection (UTI) is an infection of any part of the urinary tract. The urinary tract includes the:  Kidneys.  Ureters.  Bladder.  Urethra.  These organs make, store, and get rid of pee (urine) in the body. Follow these instructions at home:  Take over-the-counter and prescription medicines only as told by your doctor.  If you were prescribed an antibiotic medicine, take it as told by your doctor. Do not stop taking the antibiotic even if you start to feel better.  Avoid the following drinks: ? Alcohol. ? Caffeine. ? Tea. ? Carbonated drinks.  Drink enough fluid to keep your pee clear or pale yellow.  Keep all follow-up visits as told by your doctor. This is important.  Make sure to: ? Empty your bladder often and completely. Do not to hold pee for long periods of time. ? Empty your bladder before and after sex. ? Wipe from front to back after a bowel movement if you are female. Use each tissue one time when you wipe. Contact a doctor if:  You have back pain.  You have a fever.  You feel sick to your stomach (nauseous).  You throw up (vomit).  Your symptoms do  not get better after 3 days.  Your symptoms go away and then come back. Get help right away if:  You have very bad back pain.  You have very bad lower belly (abdominal) pain.  You are throwing up and cannot keep down any medicines or water. This information is not intended to replace advice given to you by your health care provider. Make sure you discuss any questions you have with your health care provider. Document Released: 11/29/2007 Document Revised: 11/18/2015 Document Reviewed: 05/03/2015 Elsevier Interactive Patient Education  2018 Elsevier Inc.  

## 2018-01-17 LAB — URINALYSIS, MICROSCOPIC ONLY
Casts: NONE SEEN /lpf
WBC, UA: >30 /hpf — AB (ref 0–5)

## 2018-01-17 LAB — URINE CULTURE

## 2018-01-21 ENCOUNTER — Other Ambulatory Visit: Payer: Self-pay | Admitting: Family Medicine

## 2018-01-21 DIAGNOSIS — R310 Gross hematuria: Secondary | ICD-10-CM

## 2018-01-21 NOTE — Progress Notes (Deleted)
Subjective:    Patient ID: Kristin Tapia, female    DOB: 14-Aug-1945, 72 y.o.   MRN: [id number]  01/21/2018  No chief complaint on file.    HPI This 72 y.o. female presents for evaluation of ***. BP Readings from Last 3 Encounters:  01/16/18 126/72  09/02/17 123/64  06/23/17 98/60   Wt Readings from Last 3 Encounters:  01/16/18 138 lb 3.2 oz (62.7 kg)  06/23/17 136 lb 2 oz (61.7 kg)  06/20/17 134 lb 6.4 oz (61 kg)   Immunization History  Administered Date(s) Administered  . Hepatitis A 09/09/2012  . Hepatitis A, Adult 04/16/2013  . Hepatitis B 09/09/2012, 10/14/2012  . Hepatitis B, adult 04/16/2013  . Influenza Split 03/26/2012  . Influenza,inj,Quad PF,6+ Mos 04/16/2013, 03/16/2014, 03/11/2015, 02/27/2017  . Influenza-Unspecified 02/09/2016  . Pneumococcal Conjugate-13 05/18/2014  . Pneumococcal Polysaccharide-23 03/03/2011  . Td 07/13/2008  . Tdap 09/02/2017  . Zoster 08/23/2007  . Zoster Recombinat (Shingrix) 12/27/2016, 02/24/2017    Review of Systems  Past Medical History:  Diagnosis Date  . Abnormality of gait 03/11/2009  . Allergy    immunotherapy/[facility] [place].  Marland Kitchen Anxiety   . Arthritis   . Asthma    childhood  . Cataract   . Cavus deformity of foot, acquired 03/11/2009  . Cystocele   . Detached retina   . GERD (gastroesophageal reflux disease)   . Hip pain 01/17/2013  . Metatarsalgia 03/11/2009  . Osteoporosis   . Postmenopausal atrophic vaginitis   . Rectocele   . Restless leg syndrome    L leg; s/p consultation by Dohmeier/Neurology.  . Sciatica of left side 01/17/2010  . Squamous cell carcinoma of skin 01/25/2015   L shoulder  . Thyroid disease    Past Surgical History:  Procedure Laterality Date  . APPENDECTOMY    . COLONOSCOPY  04/2009   Int & Ext Hemorrhoids, L sided diverticula.  Magod.  Repeat 5 years  . ESOPHAGOGASTRODUODENOSCOPY  04/2009   tiny HH, minimal antritis, gastric polyps. Magod.  Marland Kitchen EYE SURGERY    . MYOMECTOMY     [place].  . TONSILLECTOMY     Allergies  Allergen Reactions  . Sulfonamide Derivatives    Current Outpatient Medications on File Prior to Visit  Medication Sig Dispense Refill  . acetaminophen (TYLENOL) 325 MG tablet Take 750 mg by mouth 2 (two) times daily.     Marland Kitchen ALPRAZolam (XANAX) 0.5 MG tablet Take 0.5 tablets (0.25 mg total) by mouth at bedtime as needed for anxiety. 30 tablet 5  . amoxicillin-clavulanate (AUGMENTIN) 875-125 MG tablet Take 1 tablet by mouth 2 (two) times daily. 14 tablet 0  . Calcium Carbonate-Vitamin D (CALCIUM 600+D3) 600-400 MG-UNIT per tablet Take 1 tablet by mouth daily.     . cholecalciferol (VITAMIN D) 400 UNITS TABS Take 2,000 Units by mouth daily.     . clobetasol (TEMOVATE) 0.05 % external solution APPLY TO AFFECTED AREA 1 TO 3 TIMES A DAY FOR 10 DAYS  1  . cycloSPORINE (RESTASIS) 0.05 % ophthalmic emulsion Place 1 drop into both eyes once.     Marland Kitchen estradiol (ESTRACE) 0.1 MG/GM vaginal cream Place 1 Applicatorful vaginally at bedtime.    . Estradiol 10 MCG TABS vaginal tablet Place 1 tablet (10 mcg total) vaginally 2 (two) times a week. (Patient taking differently: Place 1 each vaginally 2 (two) times a week. ) 8 tablet 11  . fexofenadine (ALLEGRA) 180 MG tablet Take 180 mg by mouth every morning.    Marland Kitchen  gabapentin (NEURONTIN) 300 MG capsule TAKE 1 TO 2 CAPSULES (300-600 MG TOTAL) BY MOUTH 3 (THREE) TIMES DAILY AS NEEDED. 90 capsule 5  . ipratropium (ATROVENT) 0.03 % nasal spray Place 2 sprays into the nose 3 (three) times daily.    Marland Kitchen lidocaine (LMX) 4 % cream Apply 1 application topically as needed.    Marland Kitchen MAGNESIUM GLYCINATE PLUS PO Take 400 mg by mouth.    . meloxicam (MOBIC) 7.5 MG tablet Take 1 tablet (7.5 mg total) by mouth 2 (two) times daily. (Patient taking differently: Take 7.5 mg by mouth every other day. ) 60 tablet 3  . Omega-3 Fatty Acids (FISH OIL) 1200 MG CAPS Take 1,200 mg by mouth daily. Patient takes 2 gel caps daily    . terconazole (TERAZOL 7)  0.4 % vaginal cream Apply qhs for 1 week then once a week 45 g 6  . traMADol (ULTRAM) 50 MG tablet TAKE 1 TABLET BY MOUTH EVERY 8 HOURS AS NEEDED. 60 tablet 5  . TURMERIC CURCUMIN PO Take by mouth.     No current facility-administered medications on file prior to visit.    Social History   Socioeconomic History  . Marital status: Married    Spouse name: Not on file  . Number of children: 2  . Years of education: Not on file  . Highest education level: Not on file  Occupational History  . 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  Social Needs  . Financial resource strain: Not on file  . Food insecurity:    Worry: Not on file    Inability: Not on file  . Transportation needs:    Medical: Not on file    Non-medical: Not on file  Tobacco Use  . Smoking status: Never Smoker  . Smokeless tobacco: Never Used  Substance and Sexual Activity  . Alcohol use: No  . Drug use: No  . Sexual activity: Not on file  Lifestyle  . Physical activity:    Days per week: Not on file    Minutes per session: Not on file  . Stress: Not on file  Relationships  . Social connections:    Talks on phone: Not on file    Gets together: Not on file    Attends religious service: Not on file    Active member of club or organization: Not on file    Attends meetings of clubs or organizations: Not on file    Relationship status: Not on file  . Intimate partner violence:    Fear of current or ex partner: Not on file    Emotionally abused: Not on file    Physically abused: Not on file    Forced sexual activity: Not on file  Other Topics Concern  . Not on file  Social History Narrative   Marital status:  Married x 48 years; happily married      Children:  2 children; 1 grandchild Berline Lopes)      Lives: with husband.      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      Tobacco: never       Alcohol: none      Drugs: none      Exercise: daily.  3-4 miles walking daily; water aerobics three days per week warm water.  1-5-2.0 hours per day. Walks atleast 10,000 steps per day; warm water exercises  three days per week.      Advanced Directives:  +Living Will; FULL CODE; no prolonged measures.       ADLs: independent with all ADLs; no assistant devices for ambulation.   Family History  Problem Relation Age of Onset  . Arthritis Mother        mild  . Hypertension Mother   . Dementia Mother   . Cancer Mother 22       breast cancer  . Hyperlipidemia Mother   . Heart disease Mother   . COPD Father   . Stroke Father   . Heart disease Father   . Seizures Brother   . Cancer Brother   . Osteoporosis Neg Hx        Objective:    There were no vitals taken for this visit. Physical Exam No results found. Depression screen [facility] 2/9 01/16/2018 06/20/2017 02/27/2017 01/19/2017 12/25/2016  Decreased Interest 0 0 0 0 0  Down, Depressed, Hopeless 0 0 0 0 0  PHQ - 2 Score 0 0 0 0 0   Fall Risk  01/16/2018 06/20/2017 02/27/2017 01/19/2017 01/19/2017  Falls in the past year? No No No No No  Number falls in past yr: - - - - -  Risk for fall due to : - - - - -        Assessment & Plan:  No diagnosis found.  ***  No orders of the defined types were placed in this encounter.  No orders of the defined types were placed in this encounter.   No follow-ups on file.   Sharlyn Odonnel Elayne Guerin, M.D. Primary Care at [facility] previously [facility] [place] [address] [place], [place]  [address] [phone] phone [phone] fax

## 2018-01-23 ENCOUNTER — Telehealth: Payer: Self-pay | Admitting: Family Medicine

## 2018-01-23 ENCOUNTER — Ambulatory Visit (INDEPENDENT_AMBULATORY_CARE_PROVIDER_SITE_OTHER): Payer: Medicare Other | Admitting: Family Medicine

## 2018-01-23 ENCOUNTER — Encounter: Payer: Self-pay | Admitting: Family Medicine

## 2018-01-23 DIAGNOSIS — R310 Gross hematuria: Secondary | ICD-10-CM

## 2018-01-23 DIAGNOSIS — M8589 Other specified disorders of bone density and structure, multiple sites: Secondary | ICD-10-CM

## 2018-01-23 NOTE — Telephone Encounter (Signed)
Copied from [place] [phone]. Topic: Quick Communication - See Telephone Encounter >> Jan 23, 2018  8:46 AM Bea Graff, NT wrote: CRM for notification. See Telephone encounter for: 01/23/18. Pt states that she was unable to reply to mychart message from Dr. Tamala Julian but states that she is feeling better from what she was seen for. Pt also states she will be coming in for another urine test as recommended.

## 2018-01-24 LAB — MICROSCOPIC EXAMINATION
BACTERIA UA: NONE SEEN
CASTS: NONE SEEN /LPF

## 2018-01-24 LAB — URINALYSIS, ROUTINE W REFLEX MICROSCOPIC
BILIRUBIN UA: NEGATIVE
Glucose, UA: NEGATIVE
Ketones, UA: NEGATIVE
LEUKOCYTES UA: NEGATIVE
Nitrite, UA: NEGATIVE
PH UA: 7.5 (ref 5.0–7.5)
PROTEIN UA: NEGATIVE
Specific Gravity, UA: 1.019 (ref 1.005–1.030)
Urobilinogen, Ur: 0.2 mg/dL (ref 0.2–1.0)

## 2018-01-24 LAB — URINE CULTURE: Organism ID, Bacteria: NO GROWTH

## 2018-01-30 NOTE — Progress Notes (Signed)
Nurse visit only

## 2018-02-20 ENCOUNTER — Telehealth: Payer: Self-pay | Admitting: Family Medicine

## 2018-02-20 NOTE — Telephone Encounter (Signed)
Pt called back in to follow up on refill request. Pt says that she will be going out of town and need a refill as soon as possible. Pt says that she has had the pharmacy to send over several request (not showing).   Please assist further.    [phone]

## 2018-02-20 NOTE — Telephone Encounter (Addendum)
Copied from [place] [phone]. Topic: General - Other >> Feb 20, 2018  8:03 AM Lennox Solders wrote: Reason for CRM: pt has contacted the pharm [facility] [address]. Pt needs a refill on tramadol

## 2018-02-21 ENCOUNTER — Other Ambulatory Visit: Payer: Self-pay | Admitting: Family Medicine

## 2018-02-21 MED ORDER — TRAMADOL HCL 50 MG PO TABS: 50.0000 mg | ORAL_TABLET | Freq: Three times a day (TID) | ORAL | 3 refills | Status: DC | PRN

## 2018-02-28 NOTE — Telephone Encounter (Signed)
Medication refilled on 02/21/18 with 3 refills

## 2018-02-28 NOTE — Telephone Encounter (Signed)
Please advise. Dgaddy, CMA 

## 2018-03-04 ENCOUNTER — Ambulatory Visit: Payer: Self-pay | Admitting: *Deleted

## 2018-03-04 ENCOUNTER — Encounter: Payer: Self-pay | Admitting: Physician Assistant

## 2018-03-04 ENCOUNTER — Other Ambulatory Visit: Payer: Self-pay

## 2018-03-04 ENCOUNTER — Ambulatory Visit: Payer: Medicare Other | Admitting: Physician Assistant

## 2018-03-04 VITALS — BP 125/74 | HR 78 | Temp 98.4°F | Resp 18 | Ht 64.96 in | Wt 135.0 lb

## 2018-03-04 DIAGNOSIS — R42 Dizziness and giddiness: Secondary | ICD-10-CM | POA: Diagnosis not present

## 2018-03-04 DIAGNOSIS — R82998 Other abnormal findings in urine: Secondary | ICD-10-CM

## 2018-03-04 DIAGNOSIS — R110 Nausea: Secondary | ICD-10-CM

## 2018-03-04 DIAGNOSIS — R55 Syncope and collapse: Secondary | ICD-10-CM | POA: Diagnosis not present

## 2018-03-04 DIAGNOSIS — S060X9A Concussion with loss of consciousness of unspecified duration, initial encounter: Secondary | ICD-10-CM | POA: Diagnosis not present

## 2018-03-04 LAB — POCT CBC
Granulocyte percent: 69 %G (ref 37–80)
HCT, POC: 38.6 % (ref 37.7–47.9)
Hemoglobin: 13.1 g/dL (ref 12.2–16.2)
LYMPH, POC: 1.6 (ref 0.6–3.4)
MCH: 32.6 pg — AB (ref 27–31.2)
MCHC: 33.9 g/dL (ref 31.8–35.4)
MCV: 96.3 fL (ref 80–97)
MID (CBC): 0.3 (ref 0–0.9)
MPV: 6 fL (ref 0–99.8)
PLATELET COUNT, POC: 352 10*3/uL (ref 142–424)
POC Granulocyte: 4.2 (ref 2–6.9)
POC LYMPH %: 26.4 % (ref 10–50)
POC MID %: 4.6 %M (ref 0–12)
RBC: 4.01 M/uL — AB (ref 4.04–5.48)
RDW, POC: 12.8 %
WBC: 6.1 10*3/uL (ref 4.6–10.2)

## 2018-03-04 LAB — POCT URINALYSIS DIP (MANUAL ENTRY)
Bilirubin, UA: NEGATIVE
Glucose, UA: NEGATIVE mg/dL
Ketones, POC UA: NEGATIVE mg/dL
NITRITE UA: NEGATIVE
PROTEIN UA: NEGATIVE mg/dL
SPEC GRAV UA: 1.015 (ref 1.010–1.025)
UROBILINOGEN UA: 0.2 U/dL
pH, UA: 7.5 (ref 5.0–8.0)

## 2018-03-04 LAB — GLUCOSE, POCT (MANUAL RESULT ENTRY): POC Glucose: 94 mg/dl (ref 70–99)

## 2018-03-04 MED ORDER — ONDANSETRON HCL 4 MG PO TABS: 4.0000 mg | ORAL_TABLET | Freq: Three times a day (TID) | ORAL | 0 refills | Status: DC | PRN

## 2018-03-04 NOTE — Patient Instructions (Addendum)
I recommend complete brain rest for the next 2 to 3 days.  Once you are symptom-free for 24 hours you may start doing activities.  In the meantime, I have given you prescription for Zofran to use for nausea.  This can be constipating so make sure you use a stool softener if you are taking it.  Make sure you are drinking at least 64 ounces of fluids per day.  Continue eating light meals.  If no improvement after 1 week, please return office for further evaluation.  If any of your symptoms worsen or you develop new concerning symptoms, please return sooner.  It was a pleasure meeting you today.   If you have lab work done today you will be contacted with your lab results within the next 2 weeks.  If you have not heard from [place] then please contact us. The fastest way to get your results is to register for My Chart.  Vasovagal Syncope, Adult Syncope, which is commonly known as fainting or passing out, is a temporary loss of consciousness. It occurs when the blood flow to the brain is reduced. Vasovagal syncope, also called neurocardiogenic syncope, is a fainting spell that happens when blood flow to the brain is reduced because of a sudden drop in heart rate and blood pressure. Vasovagal syncope is usually harmless. However, you can get injured if you fall during a fainting spell. What are the causes? This condition is caused by a drop in heart rate and blood pressure, usually in response to a trigger. Many things and situations can trigger an episode, including:  Pain.  Fear.  The sight of blood. This may occur during medical procedures, such as when blood is being drawn from a vein.  Common activities, such as coughing, swallowing, stretching, or going to the bathroom.  Emotional stress.  Being in a confined space.  Prolonged standing, especially in a warm environment.  Lack of sleep or rest.  Not eating for a long time.  Not drinking enough liquids.  Recent illness.  Drinking  alcohol.  Taking drugs that affect blood pressure, such as marijuana, cocaine, opiates, or inhalants.  What are the signs or symptoms? Before a fainting episode, you may:  Feel dizzy or light-headed.  Become pale.  Sense that you are going to faint.  Feel like the room is spinning.  Only see directly ahead (tunnel vision).  Feel sick to your stomach (nauseous).  See spots.  Slowly lose vision.  Hear ringing in your ears.  Have a headache.  Feel warm and sweaty.  Feel a sensation of pins and needles.  During the fainting spell, you may twitch or make jerky movements. Fainting spells usually last no longer than a few minutes before you wake up. If you get up too quickly before your body can recover, you may faint again. How is this diagnosed? This condition is diagnosed based on your symptoms, your medical history, and a physical exam. Tests may be done to rule out other causes of fainting. Tests may include:  Blood tests.  Heart tests, such as an electrocardiogram (ECG), echocardiogram, or electrophysiology study.  A test to check your response to changes in position (tilt table test).  How is this treated? Usually, treatment is not needed for this condition. Your health care provider may suggest ways to help prevent fainting episodes. These may include:  Drinking additional fluids if you are exposed to a trigger.  Sitting or lying down if you notice signs that an episode  is coming.  If your fainting spells continue, your health care provider may recommend that you:  Take medicines to prevent fainting or to help reduce further episodes of fainting.  Do certain exercises.  Wear compression stockings.  Have surgery to place a pacemaker in your body (rare).  Follow these instructions at home:  Learn to identify the signs that an episode is coming.  Sit or lie down at the first sign of a fainting spell. If you sit down, put your head down between your legs. If  you lie down, swing your legs up in the air to increase blood flow to the brain.  Avoid hot tubs and saunas.  Avoid standing for a long time. If you have to stand for a long time, try: ? Crossing your legs. ? Flexing and stretching your leg muscles. ? Squatting. ? Moving your legs. ? Bending over.  Drink enough fluid to keep your urine clear or pale yellow.  Make changes to your diet that your health care provider recommends. You may be told to: ? Avoid caffeine. ? Eat more salt.  Take over-the-counter and prescription medicines only as told by your health care provider. Contact a health care provider if:  You continue to have fainting spells despite treatment.  You faint more often despite treatment.  You lose consciousness for more than a few minutes.  You faint during or after exercising or after being startled.  You have twitching or jerky movements for longer than a few seconds during a fainting spell.  You have an episode of twitching or jerky movements without fainting. Get help right away if:  A fainting spell leads to an injury or bleeding.  You have new symptoms that occur with the fainting spells, such as: ? Shortness of breath. ? Chest pain. ? Irregular heartbeat.  You twitch or make jerky movements for more than 5 minutes.  You twitch or make jerky movements during more than one fainting spell. This information is not intended to replace advice given to you by your health care provider. Make sure you discuss any questions you have with your health care provider. Document Released: 05/29/2012 Document Revised: 11/24/2015 Document Reviewed: 04/10/2015 Elsevier Interactive Patient Education  2018 [place] Syndrome Post-concussion syndrome is the symptoms that can occur after a head injury. These symptoms can last from weeks to months. Follow these instructions at home:  Take medicines only as told by your doctor.  Do not take  aspirin.  Sleep with your head raised to help with headaches.  Avoid activities that can cause another head injury. ? Do not play contact sports like football, hockey, soccer, or basketball. ? Do not do other risky activities like downhill skiing, martial arts, or horseback riding until your doctor says it is okay.  Keep all follow-up visits as told by your doctor. This is important. Contact a doctor if:  You have a harder time: ? Paying attention. ? Focusing. ? Remembering. ? Learning new information. ? Dealing with stress.  You need more time to complete tasks.  You are easily bothered (irritable).  You have more symptoms. Get help if you have any of these symptoms for more than two weeks after your injury:  Long-lasting (chronic) headaches.  Dizziness.  Trouble balancing.  Feeling sick to your stomach (nauseous).  Trouble with your vision.  Noise or light bothers you more.  Depression.  Mood swings.  Feeling worried (anxious).  Easily bothered.  Memory problems.  Trouble concentrating  or paying attention.  Sleep problems.  Feeling tired all of the time.  Get help right away if:  You feel confused.  You feel very sleepy.  You are hard to wake up.  You feel sick to your stomach.  You keep throwing up (vomiting).  You feel like you are moving when you are not (vertigo).  Your eyes move back and forth very quickly.  You start shaking (convulsing) or pass out (faint).  You have very bad headaches that do not get better with medicine.  You cannot use your arms or legs like normal.  One of the black centers of your eyes (pupils) is bigger than the other.  You have clear or bloody fluid coming from your nose or ears.  Your problems get worse, not better. This information is not intended to replace advice given to you by your health care provider. Make sure you discuss any questions you have with your health care provider. Document Released:  07/20/2004 Document Revised: 11/18/2015 Document Reviewed: 09/17/2013 Elsevier Interactive Patient Education  2018 [employer].  IF you received an x-ray today, you will receive an invoice from [facility]. Please contact [facility] at [phone] with questions or concerns regarding your invoice.   IF you received labwork today, you will receive an invoice from [place]. Please contact [facility] at [phone] with questions or concerns regarding your invoice.   Our billing staff will not be able to assist you with questions regarding bills from these companies.  You will be contacted with the lab results as soon as they are available. The fastest way to get your results is to activate your My Chart account. Instructions are located on the last page of this paperwork. If you have not heard from [place] regarding the results in 2 weeks, please contact this office.

## 2018-03-04 NOTE — Telephone Encounter (Addendum)
Patient (chelle's mom)  Has been experiencing lightheadedness with some brief nausea and fatigue for approximately one week. About 10 days ago she experienced cold symptoms of which have resolved except for mild runny nose. Tuesday of last week (02/26/18) she began having the lightheadedness, fell and hit her forehead, reports she had an indention for several days along with an abrasion. Since this fall she has been experiencing the fatigue and feeling "foggy around the edges" which tends to clear by the afternoon. She has used cough medication at hs but is no longer using it. Also used a nose spray but no longer using. Denies HA/fever/CP/SOB/vertigo. No availability with PCP today. Appointment made for this afternoon. Reason for Disposition . [1] MODERATE dizziness (e.g., interferes with normal activities) AND [2] has NOT been evaluated by physician for this  (Exception: dizziness caused by heat exposure, sudden standing, or poor fluid intake)  Answer Assessment - Initial Assessment Questions 1. DESCRIPTION: "Describe your dizziness."     Fuzzy around the edges 2. LIGHTHEADED: "Do you feel lightheaded?" (e.g., somewhat faint, woozy, weak upon standing)     Yes upon standing. 3. VERTIGO: "Do you feel like either you or the room is spinning or tilting?" (i.e. vertigo)     no 4. SEVERITY: "How bad is it?"  "Do you feel like you are going to faint?" "Can you stand and walk?"   - MILD - walking normally   - MODERATE - interferes with normal activities (e.g., work, school)    - SEVERE - unable to stand, requires support to walk, feels like passing out now.      moderate 5. ONSET:  "When did the dizziness begin?"    About 10 days ago she had a cold then 1 week ago the lightheadedness came on 6. AGGRAVATING FACTORS: "Does anything make it worse?" (e.g., standing, change in head position)     Standing up early in the morning gets better in the afternoon. 7. HEART RATE: "Can you tell me your heart rate?"  "How many beats in 15 seconds?"  (Note: not all patients can do this)      no 8. CAUSE: "What do you think is causing the dizziness?"     Post cold symtoms 9. RECURRENT SYMPTOM: "Have you had dizziness before?" If so, ask: "When was the last time?" "What happened that time?"    Many years ago orthostatic hypotension. 10. OTHER SYMPTOMS: "Do you have any other symptoms?" (e.g., fever, chest pain, vomiting, diarrhea, bleeding)       nausea 11. PREGNANCY: "Is there any chance you are pregnant?" "When was your last menstrual period?"      no  Protocols used: DIZZINESS [facility]

## 2018-03-04 NOTE — Progress Notes (Signed)
AMELA HANDLEY  MRN: [id number] DOB: 11-27-1945  Subjective:  Kristin Tapia is a 72 y.o. female seen in office today for a chief complaint of intermittent dizziness x 1 week. Pt came down with a minor cold 9 days ago. Was taking OTC cold tablets with dextromethorphan and cough syrup liquid. 6 days ago was having BM, straining very hard, got up from the toilet, felt lightheaded and  nauseous. Next thing she knew, she was lying on the bathroom floor. She does not know if she hit her head but did have a "divet" in forehead. She does not know how long she was out either. The rest of that morning,  was lightheaded and nauseous. Since then, every morning when she gets up she feels "fuzzy around the edges" and has nausea. By mid day she feels completely better. Has some associated fatigue.Denies diaphoresis, tinitius,tunnel vision, heart palpitations,  confusion, gait instability, vomiting, blurred vision, double vision, headache, chest pain, shortness of breath, and abdominal pain.Drinking at least 48 oz of water. No alcohol consumption. Of note, has PMH of syncopal events and similar lightheaded sensation (in 2005, was seen by cardiologist, work up negative, thought to be due to decreased salt intake). After increasing salt intake, sx resolve. Not intaking much salt at this time. Also with PMH of low TSH but not for a number of years. No PMH of heart disease, stroke, or diabetes. She is not on any anticoagulation.   Review of Systems  Constitutional: Negative for fever.  Cardiovascular: Negative for leg swelling.  Genitourinary: Negative for decreased urine volume, difficulty urinating, dysuria and frequency.  Musculoskeletal: Negative for gait problem.  Skin: Negative for pallor.  Neurological: Negative for speech difficulty and weakness.   Patient Active Problem List   Diagnosis Date Noted  . Degenerative lumbar disc 11/02/2016  . Psychophysiological insomnia 06/26/2016  . Atrophic vaginitis  05/04/2016  . Vaginal moniliasis 05/04/2016  . Seasonal allergic rhinitis due to pollen 05/09/2015  . Asthma, mild intermittent 05/09/2015  . Right hamstring muscle strain 01/12/2015  . Tendinopathy of right rotator cuff 12/10/2014  . Primary osteoarthritis involving multiple joints 05/23/2014  . Osteopenia 05/23/2014  . Right tennis elbow 03/05/2014  . Pain in joint, ankle and foot 05/06/2013  . Left hip pain 01/17/2013  . Sciatica of left side 01/17/2010  . Enthesopathy of ankle and tarsus 03/11/2009  . Cavus deformity of foot 03/11/2009    Current Outpatient Medications on File Prior to Visit  Medication Sig Dispense Refill  . acetaminophen (TYLENOL) 325 MG tablet Take 750 mg by mouth 2 (two) times daily.     Marland Kitchen ALPRAZolam (XANAX) 0.5 MG tablet Take 0.5 tablets (0.25 mg total) by mouth at bedtime as needed for anxiety. 30 tablet 5  . Calcium Carbonate-Vitamin D (CALCIUM 600+D3) 600-400 MG-UNIT per tablet Take 1 tablet by mouth daily.     . cholecalciferol (VITAMIN D) 400 UNITS TABS Take 2,000 Units by mouth daily.     . clobetasol (TEMOVATE) 0.05 % external solution APPLY TO AFFECTED AREA 1 TO 3 TIMES A DAY FOR 10 DAYS  1  . fexofenadine (ALLEGRA) 180 MG tablet Take 180 mg by mouth every morning.    . gabapentin (NEURONTIN) 300 MG capsule TAKE 1 TO 2 CAPSULES (300-600 MG TOTAL) BY MOUTH 3 (THREE) TIMES DAILY AS NEEDED. 90 capsule 5  . ipratropium (ATROVENT) 0.03 % nasal spray Place 2 sprays into the nose 3 (three) times daily.    Marland Kitchen lidocaine (LMX) 4 %  cream Apply 1 application topically as needed.    Marland Kitchen MAGNESIUM GLYCINATE PLUS PO Take 400 mg by mouth.    . meloxicam (MOBIC) 7.5 MG tablet Take 1 tablet (7.5 mg total) by mouth 2 (two) times daily. (Patient taking differently: Take 7.5 mg by mouth every other day. ) 60 tablet 3  . Omega-3 Fatty Acids (FISH OIL) 1200 MG CAPS Take 1,200 mg by mouth daily. Patient takes 2 gel caps daily    . traMADol (ULTRAM) 50 MG tablet Take 1 tablet (50  mg total) by mouth every 8 (eight) hours as needed. 60 tablet 3  . TURMERIC CURCUMIN PO Take by mouth.    . estradiol (ESTRACE) 0.1 MG/GM vaginal cream Place 1 Applicatorful vaginally at bedtime.     No current facility-administered medications on file prior to visit.     Allergies  Allergen Reactions  . Sulfonamide Derivatives      Objective:  BP 125/74   Pulse 78   Temp 98.4 F (36.9 C) (Oral)   Resp 18   Ht 5' 4.96" (1.65 m)   Wt 135 lb (61.2 kg)   SpO2 98%   BMI 22.49 kg/m   Physical Exam  Constitutional: She is oriented to person, place, and time. She appears well-developed and well-nourished. No distress.  HENT:  Head: Normocephalic and atraumatic. Head is without raccoon's eyes, without Battle's sign, without contusion, without right periorbital erythema and without left periorbital erythema.  Right Ear: Tympanic membrane, external ear and ear canal normal. No middle ear effusion. No hemotympanum.  Left Ear: Tympanic membrane, external ear and ear canal normal.  No middle ear effusion. No hemotympanum.  Nose: Nose normal. No mucosal edema.  Mouth/Throat: Uvula is midline, oropharynx is clear and moist and mucous membranes are normal. No tonsillar exudate.  Eyes: Pupils are equal, round, and reactive to light. Conjunctivae and EOM are normal.  Fundoscopic exam:      The right eye shows no hemorrhage and no papilledema. The right eye shows red reflex.       The left eye shows no hemorrhage and no papilledema. The left eye shows red reflex.  Neck: Normal range of motion and full passive range of motion without pain. No Brudzinski's sign and no Kernig's sign noted.  Cardiovascular: Normal rate, regular rhythm, normal heart sounds and intact distal pulses.  Pulmonary/Chest: Effort normal and breath sounds normal. She has no decreased breath sounds. She has no wheezes. She has no rhonchi. She has no rales.  Neurological: She is alert and oriented to person, place, and time.  She has normal strength and normal reflexes. No cranial nerve deficit or sensory deficit. She displays a negative Romberg sign. Gait normal. GCS eye subscore is 4. GCS verbal subscore is 5. GCS motor subscore is 6.  Normal FNF and HTS test.  Normal Tandem walk. Able to walk on toes with ease.  Negative Dix-Hallpike test.   Skin: Skin is warm and dry.  Psychiatric: She has a normal mood and affect.  Vitals reviewed.   Orthostatic VS for the past 24 hrs:  BP- Lying Pulse- Lying BP- Sitting Pulse- Sitting  03/04/18 1402 137/85 67 119/78 74   Results for orders placed or performed in visit on 03/04/18 (from the past 24 hour(s))  POCT glucose (manual entry)     Status: None   Collection Time: 03/04/18  2:23 PM  Result Value Ref Range   POC Glucose 94 70 - 99 mg/dl  POCT CBC  Status: Abnormal   Collection Time: 03/04/18  2:25 PM  Result Value Ref Range   WBC 6.1 4.6 - 10.2 K/uL   Lymph, poc 1.6 0.6 - 3.4   POC LYMPH PERCENT 26.4 10 - 50 %L   MID (cbc) 0.3 0 - 0.9   POC MID % 4.6 0 - 12 %M   POC Granulocyte 4.2 2 - 6.9   Granulocyte percent 69.0 37 - 80 %G   RBC 4.01 (A) 4.04 - 5.48 M/uL   Hemoglobin 13.1 12.2 - 16.2 g/dL   HCT, POC 38.6 37.7 - 47.9 %   MCV 96.3 80 - 97 fL   MCH, POC 32.6 (A) 27 - 31.2 pg   MCHC 33.9 31.8 - 35.4 g/dL   RDW, POC 12.8 %   Platelet Count, POC 352 142 - 424 K/uL   MPV 6.0 0 - 99.8 fL   EKG shows sinus rhythm with rate of 63bpm. PR and QRS intervals within normal limits. No acute changes noted from prior EKG of 2014.    Assessment and Plan :  1. Syncope, unspecified syncope type Patient is asymptomatic today.  No acute findings noted on exam.  Orthostatics normal.  Point-of-care glucose normal.  Normal hemoglobin.  UA with trace leukocytes no urinary symptoms,, urine culture pending.  EKG with no acute findings.  Syncopal episode consistent with vasovagal episode after straining during bowel movement.  Suspect that intermittent lightheadedness and  nausea could be due to concussion after hitting her head during syncopal episode.  Could also be due to recent low salt intake as she has had the symptoms in the past when her salt intake has been low.  Recommended to complete brain rest for the next 2 to 3 days, may use Zofran for nausea.  Increase water consumption to at least 60 ounces per day.  Increase salt consumption in diet.  Advised to return to clinic if symptoms worsen, do not improve, or as needed. - EKG 12-Lead  2. Intermittent lightheadedness - CMP14+EGFR - POCT CBC - POCT glucose (manual entry) - POCT urinalysis dipstick - Orthostatic vital signs - TSH  3. Leukocytes in urine - Urine Culture  4. Concussion with loss of consciousness, initial encounter 5. Nausea without vomiting - ondansetron (ZOFRAN) 4 MG tablet; Take 1 tablet (4 mg total) by mouth every 8 (eight) hours as needed for nausea or vomiting.  Dispense: 20 tablet; Refill: 0    Tenna Delaine PA-C  [facility] 03/04/2018 2:42 PM

## 2018-03-05 LAB — CMP14+EGFR
ALBUMIN: 4.2 g/dL (ref 3.5–4.8)
ALK PHOS: 51 IU/L (ref 39–117)
ALT: 17 IU/L (ref 0–32)
AST: 19 IU/L (ref 0–40)
Albumin/Globulin Ratio: 1.7 (ref 1.2–2.2)
BUN / CREAT RATIO: 18 (ref 12–28)
BUN: 16 mg/dL (ref 8–27)
Bilirubin Total: 0.3 mg/dL (ref 0.0–1.2)
CO2: 31 mmol/L — AB (ref 20–29)
CREATININE: 0.9 mg/dL (ref 0.57–1.00)
Calcium: 9.5 mg/dL (ref 8.7–10.3)
Chloride: 100 mmol/L (ref 96–106)
GFR calc Af Amer: 74 mL/min/{1.73_m2} (ref >59)
GFR calc non Af Amer: 64 mL/min/{1.73_m2} (ref >59)
GLUCOSE: 93 mg/dL (ref 65–99)
Globulin, Total: 2.5 g/dL (ref 1.5–4.5)
Potassium: 5.3 mmol/L — ABNORMAL HIGH (ref 3.5–5.2)
Sodium: 139 mmol/L (ref 134–144)
Total Protein: 6.7 g/dL (ref 6.0–8.5)

## 2018-03-05 LAB — TSH: TSH: 1.36 u[IU]/mL (ref 0.450–4.500)

## 2018-03-07 ENCOUNTER — Encounter: Payer: Self-pay | Admitting: Physician Assistant

## 2018-03-07 LAB — URINE CULTURE

## 2018-03-10 ENCOUNTER — Encounter: Payer: Self-pay | Admitting: Physician Assistant

## 2018-03-11 ENCOUNTER — Other Ambulatory Visit: Payer: Self-pay | Admitting: Physician Assistant

## 2018-03-11 ENCOUNTER — Encounter: Payer: Self-pay | Admitting: Physician Assistant

## 2018-03-11 DIAGNOSIS — E875 Hyperkalemia: Secondary | ICD-10-CM

## 2018-03-11 MED ORDER — NITROFURANTOIN MONOHYD MACRO 100 MG PO CAPS: 100.0000 mg | ORAL_CAPSULE | Freq: Two times a day (BID) | ORAL | 0 refills | Status: AC

## 2018-03-11 MED ORDER — FLUCONAZOLE 150 MG PO TABS: 150.0000 mg | ORAL_TABLET | Freq: Once | ORAL | 0 refills | Status: AC

## 2018-03-11 NOTE — Progress Notes (Signed)
Meds ordered this encounter  Medications  . nitrofurantoin, macrocrystal-monohydrate, (MACROBID) 100 MG capsule    Sig: Take 1 capsule (100 mg total) by mouth 2 (two) times daily for 5 days.    Dispense:  10 capsule    Refill:  0    Order Specific Question:   Supervising Provider    Answer:   Horald Pollen [id number]  . fluconazole (DIFLUCAN) 150 MG tablet    Sig: Take 1 tablet (150 mg total) by mouth once for 1 dose. Repeat if needed    Dispense:  2 tablet    Refill:  0    Order Specific Question:   Supervising Provider    Answer:   Horald Pollen [[id number]]

## 2018-03-15 ENCOUNTER — Ambulatory Visit (INDEPENDENT_AMBULATORY_CARE_PROVIDER_SITE_OTHER): Payer: Medicare Other | Admitting: Family Medicine

## 2018-03-15 DIAGNOSIS — E875 Hyperkalemia: Secondary | ICD-10-CM

## 2018-03-16 LAB — BASIC METABOLIC PANEL
BUN / CREAT RATIO: 24 (ref 12–28)
BUN: 21 mg/dL (ref 8–27)
CO2: 28 mmol/L (ref 20–29)
Calcium: 9.8 mg/dL (ref 8.7–10.3)
Chloride: 100 mmol/L (ref 96–106)
Creatinine, Ser: 0.88 mg/dL (ref 0.57–1.00)
GFR, EST AFRICAN AMERICAN: 76 mL/min/{1.73_m2} (ref >59)
GFR, EST NON AFRICAN AMERICAN: 66 mL/min/{1.73_m2} (ref >59)
Glucose: 66 mg/dL (ref 65–99)
Potassium: 4.6 mmol/L (ref 3.5–5.2)
Sodium: 142 mmol/L (ref 134–144)

## 2018-03-17 ENCOUNTER — Encounter: Payer: Self-pay | Admitting: Physician Assistant

## 2018-03-17 NOTE — Progress Notes (Signed)
Lab only visit 

## 2018-03-25 LAB — HM DEXA SCAN

## 2018-03-27 ENCOUNTER — Telehealth: Payer: Self-pay | Admitting: Family Medicine

## 2018-03-27 NOTE — Telephone Encounter (Signed)
Called and spoke with pt and made her an appt with Dr. Pamella Pert on 04/03/18 at 1:20. Advised pt of building number, time and late policy.

## 2018-04-03 ENCOUNTER — Other Ambulatory Visit: Payer: Self-pay

## 2018-04-03 ENCOUNTER — Encounter: Payer: Self-pay | Admitting: Family Medicine

## 2018-04-03 ENCOUNTER — Ambulatory Visit: Payer: Medicare Other | Admitting: Family Medicine

## 2018-04-03 VITALS — BP 117/70 | HR 81 | Temp 98.8°F | Resp 16 | Ht 64.0 in | Wt 132.4 lb

## 2018-04-03 DIAGNOSIS — M8589 Other specified disorders of bone density and structure, multiple sites: Secondary | ICD-10-CM | POA: Diagnosis not present

## 2018-04-03 DIAGNOSIS — M5136 Other intervertebral disc degeneration, lumbar region: Secondary | ICD-10-CM

## 2018-04-03 DIAGNOSIS — M5432 Sciatica, left side: Secondary | ICD-10-CM | POA: Diagnosis not present

## 2018-04-03 DIAGNOSIS — N898 Other specified noninflammatory disorders of vagina: Secondary | ICD-10-CM

## 2018-04-03 LAB — POCT WET + KOH PREP
Trich by wet prep: ABSENT
Yeast by KOH: ABSENT
Yeast by wet prep: ABSENT

## 2018-04-03 MED ORDER — TRAMADOL HCL 50 MG PO TABS: 50.0000 mg | ORAL_TABLET | Freq: Three times a day (TID) | ORAL | 3 refills | Status: DC | PRN

## 2018-04-03 MED ORDER — FLUCONAZOLE 150 MG PO TABS: 150.0000 mg | ORAL_TABLET | Freq: Once | ORAL | 0 refills | Status: AC

## 2018-04-03 NOTE — Patient Instructions (Signed)
° ° ° °  If you have lab work done today you will be contacted with your lab results within the next 2 weeks.  If you have not heard from us then please contact us. The fastest way to get your results is to register for My Chart. ° ° °IF you received an x-ray today, you will receive an invoice from [facility]. Please contact [facility] at [phone] with questions or concerns regarding your invoice.  ° °IF you received labwork today, you will receive an invoice from [facility]. Please contact [facility] at [phone] with questions or concerns regarding your invoice.  ° °Our billing staff will not be able to assist you with questions regarding bills from these companies. ° °You will be contacted with the lab results as soon as they are available. The fastest way to get your results is to activate your My Chart account. Instructions are located on the last page of this paperwork. If you have not heard from us regarding the results in 2 weeks, please contact this office. °  ° ° ° °

## 2018-04-03 NOTE — Progress Notes (Signed)
10/9/20192:02 PM  Kristin Tapia 04-11-1946, 72 y.o. female [id number]  Chief Complaint  Patient presents with  . Establish Care    with additional issues  . dexa scan    to discuss the results    HPI:   Patient is a 72 y.o. female with past medical history significant for seasonal allergies, asthma, OA of multiple joints, Left sided sciatica, insomnia, osteopenia who presents today to establish care  Previous PCP Dr Tamala Julian  Previous Dexa there was concern about worsening bone loss from osteopenia to osteoporosis of right hip, previous dexa had showed improvements Decision was to repeat instead of starting bisphonates Takes calcium and vit D and exercises regularly  Recently saw Timmothy Euler for feeling light-headed and nausea every morning for about week, resulted in passing out and hitting her head Workup resulted in UTI and high potassium She was asked to follow low K diet Repeat labs showed normal K  Has had increase frequency of UTI Associated with vaginal yeast that have been difficult to resolve Does vaginal estrogen, twice a week Taking cranberry extract  Having some waxy mild vaginal discharge with itchiness  Has been recommended to have spine surgery for stage 2 spondialythesis Takes tramadol once a day, uses cbd oil and cream for pain Also takes gabapentin  She will be leaving to [place] next week  Fall Risk  04/03/2018 03/04/2018 01/16/2018 06/20/2017 02/27/2017  Falls in the past year? Yes Yes No No No  Number falls in past yr: 2 or more 1 - - -  Injury with Fall? Yes Yes - - -  Comment head injury and in May injury to left ribs ribs- left May 2019 - - -  Risk for fall due to : - - - - -     Depression screen [facility] 2/9 04/03/2018 03/04/2018 01/16/2018  Decreased Interest 0 0 0  Down, Depressed, Hopeless 2 0 0  PHQ - 2 Score 2 0 0  Altered sleeping 3 - -  Tired, decreased energy 0 - -  Change in appetite 0 - -  Feeling bad or failure about yourself  0 - -  Trouble  concentrating 0 - -  Moving slowly or fidgety/restless 0 - -  Suicidal thoughts 0 - -  PHQ-9 Score 5 - -    Allergies  Allergen Reactions  . Sulfonamide Derivatives     Prior to Admission medications   Medication Sig Start Date End Date Taking? Authorizing Provider  acetaminophen (TYLENOL) 325 MG tablet Take 750 mg by mouth 2 (two) times daily.    Yes [provider]  ALPRAZolam Duanne Moron) 0.5 MG tablet Take 0.5 tablets (0.25 mg total) by mouth at bedtime as needed for anxiety. 06/20/17  Yes Wardell Honour, MD  Calcium Carbonate-Vitamin D (CALCIUM 600+D3) 600-400 MG-UNIT per tablet Take 1 tablet by mouth daily.    Yes [provider]  estradiol (ESTRACE) 0.1 MG/GM vaginal cream Place 1 Applicatorful vaginally at bedtime.   Yes [provider]  fexofenadine (ALLEGRA) 180 MG tablet Take 180 mg by mouth every morning.   Yes [provider]  gabapentin (NEURONTIN) 300 MG capsule TAKE 1 TO 2 CAPSULES (300-600 MG TOTAL) BY MOUTH 3 (THREE) TIMES DAILY AS NEEDED. 11/28/17  Yes Wardell Honour, MD  ipratropium (ATROVENT) 0.03 % nasal spray Place 2 sprays into the nose 3 (three) times daily.   Yes [provider]  lidocaine (LMX) 4 % cream Apply 1 application topically as needed.  Yes [provider]  MAGNESIUM GLYCINATE PLUS PO Take 400 mg by mouth.   Yes [provider]  meloxicam (MOBIC) 7.5 MG tablet Take 1 tablet (7.5 mg total) by mouth 2 (two) times daily. Patient taking differently: Take 7.5 mg by mouth every other day.  01/25/17  Yes Stefanie Libel, MD  Omega-3 Fatty Acids (FISH OIL) 1200 MG CAPS Take 1,200 mg by mouth daily. Patient takes 2 gel caps daily   Yes [provider]  traMADol (ULTRAM) 50 MG tablet Take 1 tablet (50 mg total) by mouth every 8 (eight) hours as needed. 02/21/18  Yes Rutherford Guys, MD  TURMERIC CURCUMIN PO Take by mouth.   Yes [provider]  cholecalciferol (VITAMIN D) 400 UNITS TABS Take  2,000 Units by mouth daily.     [provider]  clobetasol (TEMOVATE) 0.05 % external solution APPLY TO AFFECTED AREA 1 TO 3 TIMES A DAY FOR 10 DAYS 04/20/17   [provider]    Past Medical History:  Diagnosis Date  . Abnormality of gait 03/11/2009  . Allergy    immunotherapy/[facility] [place].  Marland Kitchen Anxiety   . Arthritis   . Asthma    childhood  . Cataract   . Cavus deformity of foot, acquired 03/11/2009  . Cystocele   . Detached retina   . GERD (gastroesophageal reflux disease)   . Hip pain 01/17/2013  . Metatarsalgia 03/11/2009  . Osteoporosis   . Postmenopausal atrophic vaginitis   . Rectocele   . Restless leg syndrome    L leg; s/p consultation by Dohmeier/Neurology.  . Sciatica of left side 01/17/2010  . Squamous cell carcinoma of skin 01/25/2015   L shoulder  . Thyroid disease     Past Surgical History:  Procedure Laterality Date  . APPENDECTOMY    . COLONOSCOPY  04/2009   Int & Ext Hemorrhoids, L sided diverticula.  [place].  Repeat 5 years  . ESOPHAGOGASTRODUODENOSCOPY  04/2009   tiny HH, minimal antritis, gastric polyps. [place].  Marland Kitchen EYE SURGERY    . MYOMECTOMY     [place].  . TONSILLECTOMY      Social History   Tobacco Use  . Smoking status: Never Smoker  . Smokeless tobacco: Never Used  Substance Use Topics  . Alcohol use: No    Family History  Problem Relation Age of Onset  . Arthritis Mother        mild  . Hypertension Mother   . Dementia Mother   . Cancer Mother 65       breast cancer  . Hyperlipidemia Mother   . Heart disease Mother   . COPD Father   . Stroke Father   . Heart disease Father   . Seizures Brother   . Cancer Brother   . Osteoporosis Neg Hx     ROS Per hpi  OBJECTIVE:  Blood pressure 117/70, pulse 81, temperature 98.8 F (37.1 C), temperature source Oral, resp. rate 16, height 5\' 4"  (1.626 m), weight 132 lb 6.4 oz (60.1 kg), SpO2 98 %. Body mass index is 22.73 kg/m.   Physical Exam    Constitutional: She is oriented to person, place, and time. She appears well-developed and well-nourished.  HENT:  Head: Normocephalic and atraumatic.  Mouth/Throat: Mucous membranes are normal.  Eyes: Pupils are equal, round, and reactive to light. Conjunctivae and EOM are normal. No scleral icterus.  Neck: Neck supple.  Pulmonary/Chest: Effort normal.  Neurological: She is alert and oriented to person, place,  and time.  Skin: Skin is warm and dry.  Psychiatric: She has a normal mood and affect.  Nursing note and vitals reviewed.  dexa - osteopenia, statistically improved BMD at all sites  Results for orders placed or performed in visit on 04/03/18 (from the past 24 hour(s))  POCT Wet + KOH Prep     Status: Abnormal   Collection Time: 04/03/18  2:46 PM  Result Value Ref Range   Yeast by KOH Absent Absent   Yeast by wet prep Absent Absent   WBC by wet prep None (A) Few   Clue Cells Wet Prep HPF POC None None   Trich by wet prep Absent Absent   Bacteria Wet Prep HPF POC Few (A) Few   Epithelial Cells By [employer] Pref ([facility]) Few None, Few, Too numerous to count   RBC,UR,HPF,POC None None RBC/hpf    ASSESSMENT and PLAN  1. Sciatica of left side 2. Degenerative lumbar disc Stable. Refilled tramadol  3. Vaginal discharge Negative wet prep, diflucan to take to [place] in case symptoms worsen - POCT Wet + KOH Prep  4. Osteopenia of multiple sites Improved BMD. Continue with bone healthy LFM  Other orders - traMADol (ULTRAM) 50 MG tablet; Take 1 tablet (50 mg total) by mouth every 8 (eight) hours as needed. - fluconazole (DIFLUCAN) 150 MG tablet; Take 1 tablet (150 mg total) by mouth once for 1 dose. Repeat if needed  Return for when she comes back from her trip.    Rutherford Guys, MD [facility] [place], [place] [address] Ph.  [phone] Fax [phone]

## 2018-04-08 ENCOUNTER — Telehealth: Payer: Self-pay | Admitting: Family Medicine

## 2018-04-08 NOTE — Telephone Encounter (Signed)
Copied from [place] [id number]. Topic: Quick Communication - See Telephone Encounter >> Apr 08, 2018  2:32 PM Rutherford Nail, [place] wrote: CRM for notification. See Telephone encounter for: 04/08/18. Patient calling and states that she needs a letter stating that her traMADol (ULTRAM) 50 MG tablet is medically necessary for pain. States that she is leaving for [place] this weekend and just found out that the letter is needed before they will let her into the country. Please advise.  CB#: [phone]

## 2018-04-09 ENCOUNTER — Telehealth: Payer: Self-pay

## 2018-04-09 NOTE — Telephone Encounter (Signed)
Please advise 

## 2018-04-09 NOTE — Telephone Encounter (Signed)
Letter written put in box at front desk

## 2018-04-09 NOTE — Telephone Encounter (Signed)
Please write. She takes it for chronic pain secondary to degenerative disc disease and sciatica. thanks

## 2018-04-12 NOTE — Telephone Encounter (Signed)
Spoke with pt and she verbalized she did receive her letter.

## 2018-06-02 ENCOUNTER — Encounter: Payer: Self-pay | Admitting: Family Medicine

## 2018-06-03 ENCOUNTER — Other Ambulatory Visit: Payer: Self-pay | Admitting: *Deleted

## 2018-06-03 MED ORDER — GABAPENTIN 300 MG PO CAPS: ORAL_CAPSULE | ORAL | 0 refills | Status: DC

## 2018-06-21 ENCOUNTER — Encounter: Payer: Self-pay | Admitting: Family Medicine

## 2018-06-25 ENCOUNTER — Other Ambulatory Visit: Payer: Self-pay | Admitting: *Deleted

## 2018-06-25 ENCOUNTER — Encounter: Payer: Medicare Other | Admitting: Family Medicine

## 2018-06-25 MED ORDER — GABAPENTIN 300 MG PO CAPS: ORAL_CAPSULE | ORAL | 0 refills | Status: DC

## 2018-07-02 ENCOUNTER — Other Ambulatory Visit: Payer: Self-pay

## 2018-07-02 ENCOUNTER — Telehealth: Payer: Self-pay | Admitting: Family Medicine

## 2018-07-02 ENCOUNTER — Ambulatory Visit (INDEPENDENT_AMBULATORY_CARE_PROVIDER_SITE_OTHER): Payer: Medicare Other | Admitting: Family Medicine

## 2018-07-02 ENCOUNTER — Encounter: Payer: Self-pay | Admitting: Family Medicine

## 2018-07-02 VITALS — BP 114/75 | HR 98 | Temp 98.9°F | Ht 64.0 in | Wt 130.2 lb

## 2018-07-02 DIAGNOSIS — Z0001 Encounter for general adult medical examination with abnormal findings: Secondary | ICD-10-CM | POA: Diagnosis not present

## 2018-07-02 DIAGNOSIS — M5136 Other intervertebral disc degeneration, lumbar region: Secondary | ICD-10-CM | POA: Diagnosis not present

## 2018-07-02 DIAGNOSIS — F411 Generalized anxiety disorder: Secondary | ICD-10-CM | POA: Diagnosis not present

## 2018-07-02 DIAGNOSIS — Z5181 Encounter for therapeutic drug level monitoring: Secondary | ICD-10-CM

## 2018-07-02 DIAGNOSIS — G4700 Insomnia, unspecified: Secondary | ICD-10-CM | POA: Diagnosis not present

## 2018-07-02 DIAGNOSIS — Z0000 Encounter for general adult medical examination without abnormal findings: Secondary | ICD-10-CM

## 2018-07-02 LAB — CMP14+EGFR
ALT: 20 IU/L (ref 0–32)
AST: 18 IU/L (ref 0–40)
Albumin/Globulin Ratio: 1.8 (ref 1.2–2.2)
Albumin: 4.2 g/dL (ref 3.5–4.8)
Alkaline Phosphatase: 42 IU/L (ref 39–117)
BUN/Creatinine Ratio: 16 (ref 12–28)
BUN: 15 mg/dL (ref 8–27)
Bilirubin Total: 0.3 mg/dL (ref 0.0–1.2)
CO2: 25 mmol/L (ref 20–29)
Calcium: 9.7 mg/dL (ref 8.7–10.3)
Chloride: 99 mmol/L (ref 96–106)
Creatinine, Ser: 0.95 mg/dL (ref 0.57–1.00)
GFR calc Af Amer: 69 mL/min/{1.73_m2} (ref >59)
GFR calc non Af Amer: 60 mL/min/{1.73_m2} (ref >59)
Globulin, Total: 2.3 g/dL (ref 1.5–4.5)
Glucose: 89 mg/dL (ref 65–99)
Potassium: 4.1 mmol/L (ref 3.5–5.2)
Sodium: 139 mmol/L (ref 134–144)
Total Protein: 6.5 g/dL (ref 6.0–8.5)

## 2018-07-02 MED ORDER — MELOXICAM 7.5 MG PO TABS: 7.5000 mg | ORAL_TABLET | ORAL | 0 refills | Status: DC

## 2018-07-02 MED ORDER — MELOXICAM 7.5 MG PO TABS: 7.5000 mg | ORAL_TABLET | Freq: Every day | ORAL | 1 refills | Status: DC

## 2018-07-02 MED ORDER — ALPRAZOLAM 0.5 MG PO TABS: 0.2500 mg | ORAL_TABLET | Freq: Every evening | ORAL | 0 refills | Status: DC | PRN

## 2018-07-02 MED ORDER — NONFORMULARY OR COMPOUNDED ITEM: 1 refills | Status: DC

## 2018-07-02 MED ORDER — TRAMADOL HCL 50 MG PO TABS: 50.0000 mg | ORAL_TABLET | Freq: Three times a day (TID) | ORAL | 5 refills | Status: DC | PRN

## 2018-07-02 NOTE — Telephone Encounter (Signed)
Copied from [place] [id number]. Topic: Quick Communication - See Telephone Encounter >> Jul 02, 2018 10:30 AM Valla Leaver wrote: CRM for notification. See Telephone encounter for: 07/02/18.   [facility] calling to notify Dr. Pamella Pert that they can't compound "NONFORMULARY OR COMPOUNDED ITEM (estradiol)" that was sent today and it's something all [facility] locations do not have the capability to do. Please contact patient to consider other options to obtain this script.

## 2018-07-02 NOTE — Patient Instructions (Addendum)
If you have lab work done today you will be contacted with your lab results within the next 2 weeks.  If you have not heard from [place] then please contact us. The fastest way to get your results is to register for My Chart.   IF you received an x-ray today, you will receive an invoice from [facility]. Please contact [facility] at [phone] with questions or concerns regarding your invoice.   IF you received labwork today, you will receive an invoice from [facility]. Please contact [facility] at [phone] with questions or concerns regarding your invoice.   Our billing staff will not be able to assist you with questions regarding bills from these companies.  You will be contacted with the lab results as soon as they are available. The fastest way to get your results is to activate your My Chart account. Instructions are located on the last page of this paperwork. If you have not heard from [place] regarding the results in 2 weeks, please contact this office.     Preventive Care 43 Years and Older, Female Preventive care refers to lifestyle choices and visits with your health care provider that can promote health and wellness. What does preventive care include?  A yearly physical exam. This is also called an annual well check.  Dental exams once or twice a year.  Routine eye exams. Ask your health care provider how often you should have your eyes checked.  Personal lifestyle choices, including: ? Daily care of your teeth and gums. ? Regular physical activity. ? Eating a healthy diet. ? Avoiding tobacco and drug use. ? Limiting alcohol use. ? Practicing safe sex. ? Taking low-dose aspirin every day. ? Taking vitamin and mineral supplements as recommended by your health care provider. What happens during an annual well check? The services and screenings done by your health care provider during your annual well check will depend on your age, overall health, lifestyle  risk factors, and family history of disease. Counseling Your health care provider may ask you questions about your:  Alcohol use.  Tobacco use.  Drug use.  Emotional well-being.  Home and relationship well-being.  Sexual activity.  Eating habits.  History of falls.  Memory and ability to understand (cognition).  Work and work Statistician.  Reproductive health.  Screening You may have the following tests or measurements:  Height, weight, and BMI.  Blood pressure.  Lipid and cholesterol levels. These may be checked every 5 years, or more frequently if you are over 68 years old.  Skin check.  Lung cancer screening. You may have this screening every year starting at age 3 if you have a 30-pack-year history of smoking and currently smoke or have quit within the past 15 years.  Colorectal cancer screening. All adults should have this screening starting at age 56 and continuing until age 21. You will have tests every 1-10 years, depending on your results and the type of screening test. People at increased risk should start screening at an earlier age. Screening tests may include: ? Guaiac-based fecal occult blood testing. ? Fecal immunochemical test (FIT). ? Stool DNA test. ? Virtual colonoscopy. ? Sigmoidoscopy. During this test, a flexible tube with a tiny camera (sigmoidoscope) is used to examine your rectum and lower colon. The sigmoidoscope is inserted through your anus into your rectum and lower colon. ? Colonoscopy. During this test, a long, thin, flexible tube with a tiny camera (colonoscope) is used to examine your entire colon  and rectum.  Hepatitis C blood test.  Hepatitis B blood test.  Sexually transmitted disease (STD) testing.  Diabetes screening. This is done by checking your blood sugar (glucose) after you have not eaten for a while (fasting). You may have this done every 1-3 years.  Bone density scan. This is done to screen for osteoporosis. You may  have this done starting at age 57.  Mammogram. This may be done every 1-2 years. Talk to your health care provider about how often you should have regular mammograms. Talk with your health care provider about your test results, treatment options, and if necessary, the need for more tests. Vaccines Your health care provider may recommend certain vaccines, such as:  Influenza vaccine. This is recommended every year.  Tetanus, diphtheria, and acellular pertussis (Tdap, Td) vaccine. You may need a Td booster every 10 years.  Varicella vaccine. You may need this if you have not been vaccinated.  Zoster vaccine. You may need this after age 53.  Measles, mumps, and rubella (MMR) vaccine. You may need at least one dose of MMR if you were born in 1957 or later. You may also need a second dose.  Pneumococcal 13-valent conjugate (PCV13) vaccine. One dose is recommended after age 80.  Pneumococcal polysaccharide (PPSV23) vaccine. One dose is recommended after age 74.  Meningococcal vaccine. You may need this if you have certain conditions.  Hepatitis A vaccine. You may need this if you have certain conditions or if you travel or work in places where you may be exposed to hepatitis A.  Hepatitis B vaccine. You may need this if you have certain conditions or if you travel or work in places where you may be exposed to hepatitis B.  Haemophilus influenzae type b (Hib) vaccine. You may need this if you have certain conditions. Talk to your health care provider about which screenings and vaccines you need and how often you need them. This information is not intended to replace advice given to you by your health care provider. Make sure you discuss any questions you have with your health care provider. Document Released: 07/09/2015 Document Revised: 08/02/2017 Document Reviewed: 04/13/2015 Elsevier Interactive Patient Education  2019 [employer].

## 2018-07-02 NOTE — Telephone Encounter (Signed)
[facility]

## 2018-07-02 NOTE — Progress Notes (Signed)
Presents today for [employer] Visit-Subsequent.   Date of last exam: 06/20/2018  Interpreter used for this visit? no  Patient Care Team: Rutherford Guys, MD as PCP - General (Family Medicine) Mosetta Anis, MD as Referring Physician (Allergy) Stefanie Libel, MD as Consulting Physician (Family Medicine) Crista Luria, MD as Consulting Physician (Dermatology) Clent Jacks, MD as Consulting Physician (Ophthalmology) Clarene Essex, MD as Consulting Physician (Gastroenterology) Dohmeier, Asencion Partridge, MD as Consulting Physician (Neurology)   Other items to address today: having some issues with medications  1. Has chronic pain from her DDD lumbar back - takes gabapentin TID, tramadol at night, meloxicam once or twice a week, uses tens unit, massage, stretching during the day, pain however has been interfering with her sleep  2. Has long standing issues with insomnia, used to be on lunesta for many years, now takes xanax 2 - 3 x week, does follow sleep hygiene. Despite gabapentin making her sleepy she is still unable to initiate sleep nor maintain sleep, wakes up about every 2 hours. Has tried CBD oil and melatonin. Has never trazadone nor mirtazapine, belsomra. Has never done counseling around sleep. Has never been told she snores. Does not wake up gasping for air. She at times wakes up not feeling rested. She is unable to nap during the day.   3. Anxiety, not well controlled. Having increased stressors.   PMP reviewed  Cancer Screening: Cervical: n/a Breast: yes, 2018 Colon: yes, 2016   Other Screening: Last screening for diabetes: 2018 Last lipid screening: 2018 Dexa: 2019  ADVANCE DIRECTIVES: Discussed: yes Patient desires CPR (No ), mechanical ventilation (No ), prolonged artificial support (may include mechanical ventilation, tube/PEG feeding, etc) (No ). On File: no, patient will bring in Materials Provided: no  Immunization status:  Immunization History   Administered Date(s) Administered  . Hepatitis A 09/09/2012  . Hepatitis A, Adult 04/16/2013  . Hepatitis B 09/09/2012, 10/14/2012  . Hepatitis B, adult 04/16/2013  . Influenza Split 03/26/2012  . Influenza,inj,Quad PF,6+ Mos 04/16/2013, 03/16/2014, 03/11/2015, 02/27/2017  . Influenza-Unspecified 02/09/2016, 03/10/2018  . Pneumococcal Conjugate-13 05/18/2014  . Pneumococcal Polysaccharide-23 03/03/2011  . Td 07/13/2008  . Tdap 09/02/2017  . Zoster 08/23/2007  . Zoster Recombinat (Shingrix) 12/27/2016, 02/24/2017     There are no preventive care reminders to display for this patient.  6CIT Screen 07/02/2018  What Year? 0 points  What month? 0 points  What time? 0 points  Count back from 20 0 points  Months in reverse 0 points  Repeat phrase 0 points  Total Score 0     Functional Status Survey: Is the patient deaf or have difficulty hearing?: No Does the patient have difficulty seeing, even when wearing glasses/contacts?: Yes(just has eye appt, noticed blurry vision may be due to meds) Does the patient have difficulty concentrating, remembering, or making decisions?: No Does the patient have difficulty walking or climbing stairs?: No Does the patient have difficulty dressing or bathing?: No Does the patient have difficulty doing errands alone such as visiting a doctor's office or shopping?: No   Patient Active Problem List   Diagnosis Date Noted  . Degenerative lumbar disc 11/02/2016  . Psychophysiological insomnia 06/26/2016  . Atrophic vaginitis 05/04/2016  . Vaginal moniliasis 05/04/2016  . Seasonal allergic rhinitis due to pollen 05/09/2015  . Asthma, mild intermittent 05/09/2015  . Right hamstring muscle strain 01/12/2015  . Tendinopathy of right rotator cuff 12/10/2014  . Primary osteoarthritis involving multiple joints 05/23/2014  . Osteopenia  05/23/2014  . Right tennis elbow 03/05/2014  . Pain in joint, ankle and foot 05/06/2013  . Left hip pain 01/17/2013   . Sciatica of left side 01/17/2010  . Enthesopathy of ankle and tarsus 03/11/2009  . Cavus deformity of foot 03/11/2009     Past Medical History:  Diagnosis Date  . Abnormality of gait 03/11/2009  . Allergy    immunotherapy/Sharma .  Marland Kitchen Anxiety   . Arthritis   . Asthma    childhood  . Cataract   . Cavus deformity of foot, acquired 03/11/2009  . Cystocele   . Detached retina   . GERD (gastroesophageal reflux disease)   . Hip pain 01/17/2013  . Metatarsalgia 03/11/2009  . Osteoporosis   . Postmenopausal atrophic vaginitis   . Rectocele   . Restless leg syndrome    L leg; s/p consultation by Dohmeier/Neurology.  . Sciatica of left side 01/17/2010  . Squamous cell carcinoma of skin 01/25/2015   L shoulder  . Thyroid disease      Past Surgical History:  Procedure Laterality Date  . APPENDECTOMY    . COLONOSCOPY  04/2009   Int & Ext Hemorrhoids, L sided diverticula.  Magod.  Repeat 5 years  . ESOPHAGOGASTRODUODENOSCOPY  04/2009   tiny HH, minimal antritis, gastric polyps. Magod.  Marland Kitchen EYE SURGERY    . MYOMECTOMY     [place].  . TONSILLECTOMY       Family History  Problem Relation Age of Onset  . Arthritis Mother        mild  . Hypertension Mother   . Dementia Mother   . Cancer Mother 74       breast cancer  . Hyperlipidemia Mother   . Heart disease Mother   . COPD Father   . Stroke Father   . Heart disease Father   . Seizures Brother   . Cancer Brother   . Osteoporosis Neg Hx      Social History   Socioeconomic History  . Marital status: Married    Spouse name: Not on file  . Number of children: 2  . Years of education: Not on file  . Highest education level: Not on file  Occupational History  . 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  Social Needs  . Financial resource strain: Not on file  . Food insecurity:    Worry: Not on file    Inability: Not on file  . Transportation  needs:    Medical: Not on file    Non-medical: Not on file  Tobacco Use  . Smoking status: Never Smoker  . Smokeless tobacco: Never Used  Substance and Sexual Activity  . Alcohol use: No  . Drug use: No  . Sexual activity: Not on file  Lifestyle  . Physical activity:    Days per week: Not on file    Minutes per session: Not on file  . Stress: Not on file  Relationships  . Social connections:    Talks on phone: Not on file    Gets together: Not on file    Attends religious service: Not on file    Active member of club or organization: Not on file    Attends meetings of clubs or organizations: Not on file    Relationship status: Not on file  . Intimate partner violence:    Fear of current or ex partner: Not on file    Emotionally abused:  Not on file    Physically abused: Not on file    Forced sexual activity: Not on file  Other Topics Concern  . Not on file  Social History Narrative   Marital status:  Married x 48 years; happily married      Children:  2 children; 1 grandchild Berline Lopes)      Lives: with husband.      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      Tobacco: never      Alcohol: none      Drugs: none      Exercise: daily.  3-4 miles walking daily; water aerobics three days per week warm water.  1-5-2.0 hours per day. Walks atleast 10,000 steps per day; warm water exercises three days per week.      Advanced Directives:  +Living Will; DNR       ADLs: independent with all ADLs; no assistant devices for ambulation.     Allergies  Allergen Reactions  . Sulfonamide Derivatives      Prior to Admission medications   Medication Sig Start Date End Date Taking? Authorizing Provider  acetaminophen (TYLENOL) 325 MG tablet Take 750 mg by mouth 2 (two) times daily.    Yes [provider]  ALPRAZolam Duanne Moron) 0.5 MG tablet Take 0.5 tablets (0.25 mg total) by mouth at bedtime as needed for anxiety.  06/20/17  Yes Wardell Honour, MD  clobetasol (TEMOVATE) 0.05 % external solution APPLY TO AFFECTED AREA 1 TO 3 TIMES A DAY FOR 10 DAYS 04/20/17  Yes [provider]  estradiol (ESTRACE) 0.1 MG/GM vaginal cream Place 1 Applicatorful vaginally at bedtime.   Yes [provider]  fexofenadine (ALLEGRA) 180 MG tablet Take 180 mg by mouth every morning.   Yes [provider]  gabapentin (NEURONTIN) 300 MG capsule TAKE 1 TO 2 CAPSULES (300-600 MG TOTAL) BY MOUTH 3 (THREE) TIMES DAILY AS NEEDED. 06/25/18  Yes Rutherford Guys, MD  ipratropium (ATROVENT) 0.03 % nasal spray Place 2 sprays into the nose 3 (three) times daily.   Yes [provider]  lidocaine (LMX) 4 % cream Apply 1 application topically as needed.   Yes [provider]  MAGNESIUM GLYCINATE PLUS PO Take 400 mg by mouth.   Yes [provider]  Omega-3 Fatty Acids (FISH OIL) 1200 MG CAPS Take 1,200 mg by mouth daily. Patient takes 2 gel caps daily   Yes [provider]  traMADol (ULTRAM) 50 MG tablet Take 1 tablet (50 mg total) by mouth every 8 (eight) hours as needed. 04/03/18  Yes Rutherford Guys, MD     Depression screen [facility] 2/9 04/03/2018 03/04/2018 01/16/2018 06/20/2017 02/27/2017  Decreased Interest 0 0 0 0 0  Down, Depressed, Hopeless 2 0 0 0 0  PHQ - 2 Score 2 0 0 0 0  Altered sleeping 3 - - - -  Tired, decreased energy 0 - - - -  Change in appetite 0 - - - -  Feeling bad or failure about yourself  0 - - - -  Trouble concentrating 0 - - - -  Moving slowly or fidgety/restless 0 - - - -  Suicidal thoughts 0 - - - -  PHQ-9 Score 5 - - - -     Fall Risk  04/03/2018 03/04/2018 01/16/2018 06/20/2017 02/27/2017  Falls in the past year? Yes Yes No No No  Number falls in past yr: 2  or more 1 - - -  Injury with Fall? Yes Yes - - -  Comment head injury and in May injury to left ribs ribs- left May 2019 - - -  Risk for fall due to : - - - - -   Review of Systems  Constitutional:  Negative for chills and fever.  Respiratory: Negative for cough and shortness of breath.   Cardiovascular: Negative for chest pain, palpitations and leg swelling.  Gastrointestinal: Negative for abdominal pain, nausea and vomiting.  Musculoskeletal: Positive for back pain.  Psychiatric/Behavioral: The patient is nervous/anxious and has insomnia.   All other systems reviewed and are negative.    PHYSICAL EXAM: BP 114/75 (BP Location: Left Arm, Patient Position: Sitting, Cuff Size: Normal)   Pulse 98   Temp 98.9 F (37.2 C) (Oral)   Ht _0  (1.626 m)   Wt 130 lb 3.2 oz (59.1 kg)   SpO2 100%   BMI 22.35 kg/m    Wt Readings from Last 3 Encounters:  07/02/18 130 lb 3.2 oz (59.1 kg)  04/03/18 132 lb 6.4 oz (60.1 kg)  03/04/18 135 lb (61.2 kg)      Visual Acuity Screening   Right eye Left eye Both eyes  Without correction:     With correction: _1     Physical Exam  Constitutional: She is oriented to person, place, and time and well-developed, well-nourished, and in no distress.  HENT:  Head: Normocephalic and atraumatic.  Right Ear: Hearing, tympanic membrane, external ear and ear canal normal.  Left Ear: Hearing, tympanic membrane, external ear and ear canal normal.  Mouth/Throat: Oropharynx is clear and moist.  Eyes: Pupils are equal, round, and reactive to light. EOM are normal.  Neck: Neck supple. No thyromegaly present.  Cardiovascular: Normal rate, regular rhythm, normal heart sounds and intact distal pulses. Exam reveals no gallop and no friction rub.  No murmur heard. Pulmonary/Chest: Effort normal and breath sounds normal. She has no wheezes. She has no rales.  Abdominal: Soft. Bowel sounds are normal. She exhibits no distension and no mass. There is no abdominal tenderness.  Musculoskeletal: Normal range of motion.        General: No edema.  Lymphadenopathy:    She has no cervical adenopathy.  Neurological: She is alert and oriented to person,  place, and time. She has normal reflexes. Gait normal.  Skin: Skin is warm and dry.  Psychiatric: Mood and affect normal.  Nursing note and vitals reviewed.    Education/Counseling provided regarding diet and exercise, prevention of chronic diseases, smoking/tobacco cessation, if applicable, and reviewed "Covered Medicare Preventive Services."   ASSESSMENT/PLAN: 1. Encounter for Medicare annual wellness exam Routine HCM labs ordered. HCM reviewed/discussed. Anticipatory guidance regarding healthy weight, lifestyle and choices given.   2. Degenerative lumbar disc 3. Medication monitoring encounter pmp reviewed. Stable. Cont with current medications. Reviewed meds r/se/b - CMP14+EGFR - ToxASSURE Select 13 (MW), Urine  4. Insomnia, unspecified type 5. Generalized anxiety disorder Not controlled. Referring for sleep eval to r/o osa, etc. Referring to [facility] for CBT around insomnia. Continue with xanax prn for now until further eval, discussed potential further medications.  - Ambulatory referral to Sleep Studies - Ambulatory referral to Psychology    Other orders - traMADol (ULTRAM) 50 MG tablet; Take 1 tablet (50 mg total) by mouth every 8 (eight) hours as needed. - ALPRAZolam (XANAX) 0.5 MG tablet; Take 0.5 tablets (0.25 mg total) by mouth at bedtime as needed for anxiety. -  meloxicam (MOBIC) 7.5 MG tablet; Take 1 tablet (7.5 mg total) by mouth daily. - NONFORMULARY OR COMPOUNDED ITEM; Estradiol 0.53m/ml cream, insert 15mvaginally at bedtime twice a week  Return in about 3 months (around 10/01/2018) for insomnia.

## 2018-07-04 LAB — TOXASSURE SELECT 13 (MW), URINE

## 2018-07-05 ENCOUNTER — Ambulatory Visit (INDEPENDENT_AMBULATORY_CARE_PROVIDER_SITE_OTHER): Payer: Medicare Other | Admitting: Family Medicine

## 2018-07-05 DIAGNOSIS — Z5181 Encounter for therapeutic drug level monitoring: Secondary | ICD-10-CM

## 2018-07-05 NOTE — Progress Notes (Signed)
Pt here for nurse visit only

## 2018-07-11 ENCOUNTER — Encounter: Payer: Self-pay | Admitting: Family Medicine

## 2018-07-11 LAB — TOXASSURE SELECT 13 (MW), URINE

## 2018-07-11 MED ORDER — NONFORMULARY OR COMPOUNDED ITEM: 1 refills | Status: DC

## 2018-07-15 ENCOUNTER — Other Ambulatory Visit: Payer: Self-pay | Admitting: Family Medicine

## 2018-07-15 MED ORDER — NONFORMULARY OR COMPOUNDED ITEM: 1 refills | Status: DC

## 2018-07-16 ENCOUNTER — Ambulatory Visit (INDEPENDENT_AMBULATORY_CARE_PROVIDER_SITE_OTHER): Payer: Medicare Other | Admitting: Psychology

## 2018-07-16 DIAGNOSIS — F604 Histrionic personality disorder: Secondary | ICD-10-CM | POA: Diagnosis not present

## 2018-07-24 ENCOUNTER — Other Ambulatory Visit: Payer: Self-pay | Admitting: Family Medicine

## 2018-08-02 ENCOUNTER — Ambulatory Visit (INDEPENDENT_AMBULATORY_CARE_PROVIDER_SITE_OTHER): Payer: Medicare Other | Admitting: Psychology

## 2018-08-02 DIAGNOSIS — F064 Anxiety disorder due to known physiological condition: Secondary | ICD-10-CM | POA: Diagnosis not present

## 2018-08-09 ENCOUNTER — Encounter: Payer: Self-pay | Admitting: Family Medicine

## 2018-08-14 ENCOUNTER — Telehealth: Payer: Self-pay | Admitting: Family Medicine

## 2018-08-14 ENCOUNTER — Encounter: Payer: Self-pay | Admitting: Neurology

## 2018-08-14 ENCOUNTER — Ambulatory Visit: Payer: Medicare Other | Admitting: Neurology

## 2018-08-14 VITALS — BP 113/64 | HR 87 | Ht 64.0 in | Wt 132.0 lb

## 2018-08-14 DIAGNOSIS — G4709 Other insomnia: Secondary | ICD-10-CM | POA: Diagnosis not present

## 2018-08-14 DIAGNOSIS — G4737 Central sleep apnea in conditions classified elsewhere: Secondary | ICD-10-CM

## 2018-08-14 DIAGNOSIS — G4701 Insomnia due to medical condition: Secondary | ICD-10-CM

## 2018-08-14 DIAGNOSIS — R0683 Snoring: Secondary | ICD-10-CM | POA: Diagnosis not present

## 2018-08-14 DIAGNOSIS — G8929 Other chronic pain: Secondary | ICD-10-CM

## 2018-08-14 NOTE — Telephone Encounter (Signed)
Please advise 

## 2018-08-14 NOTE — Telephone Encounter (Signed)
Please call [facility] and advise them that pt was accidentally charged twice for the Toxassure.

## 2018-08-14 NOTE — Telephone Encounter (Signed)
Copied from [place] [phone]. Topic: General - Other >> Aug 14, 2018  2:08 PM Windy Kalata wrote: Reason for CRM: Patient has a [place] bill she states that billing told her to contact the office  Call back is [phone]

## 2018-08-14 NOTE — Progress Notes (Signed)
SLEEP MEDICINE CLINIC   Provider:  Larey Seat, MD   Primary Care Physician:  Kristin Guys, MD   Referring Provider: Rutherford Guys, MD   Chief Complaint  Patient presents with  . New Patient (Initial Visit)    pt alone, rm 10. pt states that when she saw her PCP she was having difficulty with her sleep. she was waking up 1.5/2 hrs a night if not taking alprazolam. she gets up during the night and walks around to help alleivate pain issues. she mentioned cognitive behavior therapy to PCP and she thought its worth ruling out apnea. she has seen Kristin Tapia several years ago for chronic insomnia and never had a sleep study due to not being needed at that time.   . Other    pt states when laying     HPI:  Kristin Tapia is a 73 y.o. female patient whom I had followed until 6 years ago regularly for insomnia.  She is seen here as in a referral from Kristin. Pamella Tapia for to see if,aside from pain, there may be other, organic reasons for insomnia.   The patient retired from [employer] in 2013 , went off Lunesta and was just not as worried about getting enough sleep- she was less anxious. She did well until 2 years ago when her former colleagues health declined. She became the POA for her , and found a very stressful financial mess- and was finally in December able to involve adult protective services, but the stress was immense.      Chief complaint according to patient : " I never had a sleep study but still sleep rather poorly", " I feel sleepy on gabapentin- but can't nap ".   Sleep habits are as follows: dinner time always from 5.30 to 7 PM. She closes all electronics by 8 PM, and bedtime is after 9 PM, she reads in bed, relaxing material.  Sometimes she sleeps within 20 minutes, sometimes 60 minutes. Sciatic pain wakes her frequently, in spite of gabapentin and tramadol. She uses a fit bit and the device tells her she has some deep sleep. She uses heating pads.  She sleeps best on her  back, with a tilt to the right shoulder, and alternates with the left- helping her snoring to be less loud. She wakes  Up to go urinate up to 3 times. She rises finally at 6 AM, but is awake before that. No daytime naps.      Sleep medical history: The patient had a tonsillectomy in childhood, appendectomy and oophorectomy in 1981 or thereabouts, she had various gynecological procedures and the reattachment of a detached retina in 2008.  Chronic insomnia, anxiety, pain related insomnia, snoring. Osteopenia, osteoporosis.   Family sleep history:  Brother has OSA and insomnia.   Social history: married , retired Scientist, physiological and former professor at the [employer], daughter Kristin Tapia is a PA, non smoker, seldomly drinking wine- once a month. Caffeine- only in form of cocoa.    Review of Systems: Out of a complete 14 system review, the patient complains of only the following symptoms, and all other reviewed systems are negative.   Snoring, back pain, sciatica.   Epworth score: 3/ 24  , Fatigue severity score: 23 - 33 / 63   , depression score positive - low grade.   Social History   Socioeconomic History  . Marital status: Married    Spouse name: Not on file  . Number  of children: 2  . Years of education: Not on file  . Highest education level: Not on file  Occupational History  . 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  Social Needs  . Financial resource strain: Not on file  . Food insecurity:    Worry: Not on file    Inability: Not on file  . Transportation needs:    Medical: Not on file    Non-medical: Not on file  Tobacco Use  . Smoking status: Never Smoker  . Smokeless tobacco: Never Used  Substance and Sexual Activity  . Alcohol use: Yes    Comment: 1 glass of wine a mth  . Drug use: No  . Sexual activity: Not on file  Lifestyle  . Physical activity:    Days per week: Not on file    Minutes per  session: Not on file  . Stress: Not on file  Relationships  . Social connections:    Talks on phone: Not on file    Gets together: Not on file    Attends religious service: Not on file    Active member of club or organization: Not on file    Attends meetings of clubs or organizations: Not on file    Relationship status: Not on file  . Intimate partner violence:    Fear of current or ex partner: Not on file    Emotionally abused: Not on file    Physically abused: Not on file    Forced sexual activity: Not on file  Other Topics Concern  . Not on file  Social History Narrative   Marital status:  Married x 48 years; happily married      Children:  2 children; 1 grandchild Kristin Tapia)      Lives: with husband.      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      Tobacco: never      Alcohol: none      Drugs: none      Exercise: daily.  3-4 miles walking daily; water aerobics three days per week warm water.  1-5-2.0 hours per day. Walks atleast 10,000 steps per day; warm water exercises three days per week.      Advanced Directives:  +Living Will; DNR       ADLs: independent with all ADLs; no assistant devices for ambulation.    Family History  Problem Relation Age of Onset  . Arthritis Mother        mild  . Hypertension Mother   . Dementia Mother   . Cancer Mother 49       breast cancer  . Hyperlipidemia Mother   . Heart disease Mother   . COPD Father   . Stroke Father   . Heart disease Father   . Seizures Brother   . Cancer Brother   . Osteoporosis Neg Hx     Past Medical History:  Diagnosis Date  . Abnormality of gait 03/11/2009  . Allergy    immunotherapy/Sharma Adena.  Marland Kitchen Anxiety   . Arthritis   . Asthma    childhood  . Cataract   . Cavus deformity of foot, acquired 03/11/2009  . Cystocele   . Detached retina   . GERD (gastroesophageal reflux disease)   . Hip pain 01/17/2013  . Metatarsalgia  03/11/2009  . Osteoporosis   . Postmenopausal atrophic vaginitis   .  Rectocele   . Restless leg syndrome    L leg; s/p consultation by Uma Jerde/Neurology.  . Sciatica of left side 01/17/2010  . Squamous cell carcinoma of skin 01/25/2015   L shoulder  . Thyroid disease     Past Surgical History:  Procedure Laterality Date  . APPENDECTOMY    . COLONOSCOPY  04/2009   Int & Ext Hemorrhoids, L sided diverticula.  [facility].  Repeat 5 years  . ESOPHAGOGASTRODUODENOSCOPY  04/2009   tiny HH, minimal antritis, gastric polyps. Magod.  Marland Kitchen EYE SURGERY    . MYOMECTOMY     [place].  . TONSILLECTOMY      Current Outpatient Medications  Medication Sig Dispense Refill  . ACETAMINOPHEN PO Take 1,000 mg by mouth daily.     Marland Kitchen ALPRAZolam (XANAX) 0.5 MG tablet Take 0.5 tablets (0.25 mg total) by mouth at bedtime as needed for anxiety. 30 tablet 0  . Calcium-Magnesium-Vitamin D (CALCIUM 1200+D3 PO) 1 tablet daily.    . clobetasol (TEMOVATE) 0.05 % external solution APPLY TO AFFECTED AREA 1 TO 3 TIMES A DAY FOR 10 DAYS  1  . fexofenadine (ALLEGRA) 180 MG tablet Take 180 mg by mouth every morning.    . gabapentin (NEURONTIN) 300 MG capsule TAKE 1 TO 2 CAPSULES BY MOUTH 3 TIMES DAILY AS NEEDED. (Patient taking differently: TAKE 3 capsules daily and a 4th if needed) 90 capsule 2  . ipratropium (ATROVENT) 0.03 % nasal spray Place 2 sprays into the nose 3 (three) times daily.    Marland Kitchen lidocaine (LMX) 4 % cream Apply 1 application topically as needed.    Marland Kitchen MAGNESIUM GLYCINATE PLUS PO Take 400 mg by mouth.    . meloxicam (MOBIC) 7.5 MG tablet Take 1 tablet (7.5 mg total) by mouth daily. (Patient taking differently: Take 7.5 mg by mouth daily as needed. ) 90 tablet 1  . NONFORMULARY OR COMPOUNDED ITEM Estradiol 0.2mg /ml cream, insert 25ml vaginally at bedtime twice a week 36 each 1  . Omega-3 Fatty Acids (FISH OIL) 1200 MG CAPS Take 1,200 mg by mouth daily. Patient takes 2 gel caps daily    . traMADol (ULTRAM) 50 MG  tablet Take 1 tablet (50 mg total) by mouth every 8 (eight) hours as needed. (Patient taking differently: Take 50 mg by mouth daily. ) 60 tablet 5   No current facility-administered medications for this visit.     Allergies as of 08/14/2018 - Review Complete 08/14/2018  Allergen Reaction Noted  . Sulfonamide derivatives      Vitals: BP 113/64   Pulse 87   Ht 5\' 4"  (1.626 m)   Wt 132 lb (59.9 kg)   BMI 22.66 kg/m  Last Weight:  Wt Readings from Last 1 Encounters:  08/14/18 132 lb (59.9 kg)   JSH:FWYO mass index is 22.66 kg/m.     Last Height:   Ht Readings from Last 1 Encounters:  08/14/18 5\' 4"  (1.626 m)    Physical exam:  General: The patient is awake, alert and appears not in acute distress. The patient is well groomed. Head: Normocephalic, atraumatic. Neck is supple. Mallampati 4 - furrowed tongue, red. neck circumference:13.5 . Nasal airflow patent  . Retrognathia is not seen.  Cardiovascular:  Regular rate and rhythm, without  murmurs or carotid bruit, and without distended neck veins. Respiratory: Lungs are clear to auscultation. Skin:  Without evidence of edema, or rash Trunk: BMI is 22.66 . The patient's posture is erect.  Neurologic exam : The patient is awake  and alert, oriented to place and time.   Attention span & concentration ability appears normal.  Speech is fluent,  without  dysarthria, dysphonia or aphasia.  Mood and affect are appropriate.  Cranial nerves: Pupils are equal and briskly reactive to light.  Extraocular movements  in vertical and horizontal planes intact and without nystagmus. Visual fields by finger perimetry are intact. Hearing to finger rub intact.  Facial sensation intact to fine touch. Facial motor strength is symmetric and tongue and uvula move midline. Shoulder shrug was symmetrical.   Motor exam:   Normal tone, muscle bulk in all extremities.  Sensory:  Fine touch, pinprick and vibration were tested in all extremities.  Proprioception tested in the upper extremities was normal.  Coordination: Rapid alternating movements in the fingers/hands was normal. Finger-to-nose maneuver normal without evidence of ataxia, dysmetria or tremor.  Gait and station: Patient walks without assistive device. Strength within normal limits. Stance is stable and normal.   Deep tendon reflexes: in the  upper and lower extremities are symmetric and intact.  Both patellar reflexes were elicitable and brisk.  Babinski maneuver response is  downgoing.    Assessment:  After physical and neurologic examination, review of laboratory studies,  Personal review of imaging studies, reports of other /same  Imaging studies, results of polysomnography and / or neurophysiology testing and pre-existing records as far as provided in visit., my assessment is:   1) insomnia of multifactorial origin. Evaluate for snoring/ OSA related arousals.   2) attended sleep study  preferred, the patient already has multiple fit bit records indicating that she sleeps fragmented, I need to see why.   3) Alprazolam , gabapentin and tramadol use - higher risk of central apneas.    The patient was advised of the nature of the diagnosed disorder , the treatment options and the  risks for general health and wellness arising from not treating the condition.   I spent more than 45  minutes of face to face time with the patient.  Greater than 50% of time was spent in counseling and coordination of care. We have discussed the diagnosis and differential and I answered the patient's questions.    Plan:  Treatment plan and additional workup : Attended sleep study- with good video and audio. I need to see if the patient has any RERAS, AHI, PLM arousals.    Kristin Seat, MD 3/74/8270, 7:86 AM  Certified in Neurology by ABPN Certified in [place] by [facility] [address], [place], [place] [address]

## 2018-08-16 ENCOUNTER — Ambulatory Visit: Payer: Medicare Other | Admitting: Psychology

## 2018-08-16 DIAGNOSIS — F064 Anxiety disorder due to known physiological condition: Secondary | ICD-10-CM

## 2018-08-30 ENCOUNTER — Ambulatory Visit: Payer: Medicare Other | Admitting: Psychology

## 2018-09-03 ENCOUNTER — Encounter: Payer: Self-pay | Admitting: Sports Medicine

## 2018-09-03 ENCOUNTER — Ambulatory Visit: Payer: Medicare Other | Admitting: Sports Medicine

## 2018-09-03 VITALS — BP 100/62 | Ht 64.0 in | Wt 125.5 lb

## 2018-09-03 DIAGNOSIS — M7741 Metatarsalgia, right foot: Secondary | ICD-10-CM

## 2018-09-03 NOTE — Progress Notes (Signed)
  Kristin Tapia - 73 y.o. female MRN [id number]  Date of birth: December 17, 1945   Chief complaint: R plantar foot pain  SUBJECTIVE:    History of present illness: 73 year old female with a longstanding history of bilateral foot pain who presents today with a chief complaint of metatarsal foot pain on the right side.  She has been managed for years and custom orthotics.  She has a history of cavus feet.  Her most recent pair of orthotics were made back in 2018.  She states for the past 3 months, she has been getting pain between her third and fourth metatarsal heads.  The pain is worse with prolonged walking.  It improves with some of her pairs of orthotics and with relative rest.  She is an avid walker and walks upwards of 5 miles per day and 35 to 40 miles per week.  She has been walking less secondary to this type of pain.  No associated swelling or bruising.  No ankle pain or knee pain associated with this problem.  No numbness or tingling of her feet.  She wears new balance shoes.  Her most recent pair shoes were purchased 2 to 3 months ago.   Review of systems:  As stated above   Interval past medical history, surgical history, family history, and social history obtained and are unchanged.   Of note, she has a history of pes cavus and osteoporosis.  Medications reviewed and unchanged.  Of note she is on gabapentin. Allergies reviewed and unchanged.  Of note, she has a sulfa allergy.  OBJECTIVE:  Physical exam: Vital signs are reviewed. BP 100/62   Ht 5\' 4"  (1.626 m)   Wt 125 lb 8 oz (56.9 kg)   BMI 21.54 kg/m   Gen.: Alert, oriented, appears stated age, in no apparent distress Integumentary: No rashes, significant callus formation or ecchymoses Neurologic:  Sensation intact to light touch L4-S1 Gait: normal without associated limp Musculoskeletal: Inspection of her right foot demonstrates Pez cavus feet.  She has tenderness palpation over her third and fourth metatarsal heads.  She has  interphalangeal tenderness between her third and fourth digits.  Capsular tightness felt with joint manipulation in the same respective area.  Full range of motion ankle dorsiflexion plantarflexion.  Strength testing is intact.  Negative anterior drawer of the foot.  Negative ankle inversion and eversion testing.  Negative metatarsal squeeze.  Dorsalis pedis pulse +2.  Neurovascularly intact.    ASSESSMENT & PLAN: Metatarsalgia of right foot - custom orthotics inspected and appear functional - 4 modifications made to orthotics to determine which allows for the best custom.   Neuroma pad top  Neuroma pad bottom  U pad small  U pad large - patient to try out for the next 4-6 weeks. F/u afterwards for construction of best fit orthotic vs new pair of orthotics   Clydene Laming, [place] observed and examined the patient with Dr. Lunette Stands and agree with assessment and plan.  Note reviewed and modified by me. [name], MD

## 2018-09-03 NOTE — Assessment & Plan Note (Signed)
-   custom orthotics inspected and appear functional - 4 modifications made to orthotics to determine which allows for the best custom.   Neuroma pad top  Neuroma pad bottom  U pad small  U pad large - patient to try out for the next 4-6 weeks. F/u afterwards for construction of best fit orthotic vs new pair of orthotics

## 2018-09-03 NOTE — Patient Instructions (Signed)
Today we diagnosed you with metatarsalgia of your right foot.  We made 4 separate corrections to your orthotics.  These are detailed below.  Please call and let us know which one you like better and schedule an appointment for [place] to change her custom orthotics to that type of construction.  Try each orthotic type out for 5 to 7 days to see if you like this or not.  We also recommend if you are looking for other types of shoes for walking, Hokkas, are another brand you may try out.  Follow-up in 4 to 6 weeks.  #1.  Neuroma pad on top of the orthotic #2.  Neuroma pad on the bottom portion of the orthotic #3.  U shaped blue pad small padding the metatarsals #4.  U shaped blue pad large padding the metatarsals

## 2018-09-13 ENCOUNTER — Ambulatory Visit: Payer: Medicare Other | Admitting: Psychology

## 2018-09-17 ENCOUNTER — Other Ambulatory Visit: Payer: Self-pay | Admitting: Family Medicine

## 2018-09-17 NOTE — Telephone Encounter (Signed)
Patient is requesting a refill of the following medications: Requested Prescriptions   Pending Prescriptions Disp Refills  . gabapentin (NEURONTIN) 300 MG capsule [Pharmacy Med Name: GABAPENTIN 300 MG CAPSULE] 90 capsule 2    Sig: TAKE 1 TO 2 CAPSULES BY MOUTH 3 TIMES DAILY AS NEEDED.    Date of patient request: 09/17/2018 Last office visit: 07/02/2018 Wellness Visit Date of last refill: 07/02/2018 Last refill amount: 90 tab with 2 refills  Follow up time period per chart: Return in about 3 months (around 10/01/2018) for insomnia.

## 2018-09-17 NOTE — Telephone Encounter (Signed)
Please advise on refill request. Per note on last rx it states pt is taking this differently that pt takes 3 capsules daily and a 4th as needed. Do you want to change rx?

## 2018-09-25 ENCOUNTER — Ambulatory Visit (INDEPENDENT_AMBULATORY_CARE_PROVIDER_SITE_OTHER): Payer: Medicare Other | Admitting: Psychology

## 2018-09-25 DIAGNOSIS — F4321 Adjustment disorder with depressed mood: Secondary | ICD-10-CM | POA: Diagnosis not present

## 2018-09-27 ENCOUNTER — Ambulatory Visit: Payer: Medicare Other | Admitting: Psychology

## 2018-10-11 ENCOUNTER — Ambulatory Visit: Payer: Medicare Other | Admitting: Psychology

## 2018-10-16 ENCOUNTER — Ambulatory Visit (INDEPENDENT_AMBULATORY_CARE_PROVIDER_SITE_OTHER): Payer: Medicare Other | Admitting: Psychology

## 2018-10-16 DIAGNOSIS — F4321 Adjustment disorder with depressed mood: Secondary | ICD-10-CM | POA: Diagnosis not present

## 2018-10-16 DIAGNOSIS — F5101 Primary insomnia: Secondary | ICD-10-CM

## 2018-10-25 ENCOUNTER — Ambulatory Visit: Payer: Medicare Other | Admitting: Psychology

## 2018-11-01 ENCOUNTER — Ambulatory Visit (INDEPENDENT_AMBULATORY_CARE_PROVIDER_SITE_OTHER): Payer: Medicare Other | Admitting: Psychology

## 2018-11-01 DIAGNOSIS — F4321 Adjustment disorder with depressed mood: Secondary | ICD-10-CM | POA: Diagnosis not present

## 2018-11-07 ENCOUNTER — Telehealth: Payer: Self-pay | Admitting: Family Medicine

## 2018-11-07 NOTE — Telephone Encounter (Signed)
Copied from [place] [phone]. Topic: Quick Communication - Rx Refill/Question >> Nov 07, 2018  1:16 PM Mathis Bud wrote: Medication: NONFORMULARY OR COMPOUNDED ITEM   Has the patient contacted their pharmacy? Yes, pharmacy has no more refills  Preferred Pharmacy (with phone number or street name):  [facility], [place] [phone] (Phone) [phone] (Fax)   Agent: Please be advised that RX refills may take up to 3 business days. We ask that you follow-up with your pharmacy.

## 2018-11-08 ENCOUNTER — Ambulatory Visit: Payer: Medicare Other | Admitting: Psychology

## 2018-11-11 NOTE — Telephone Encounter (Signed)
Patient is checking status of her medication the NONFORMULARY OR COMPOUNDED ITEM  Patient has three left.  Patient call back # [phone]

## 2018-11-14 ENCOUNTER — Other Ambulatory Visit: Payer: Self-pay

## 2018-11-14 DIAGNOSIS — N952 Postmenopausal atrophic vaginitis: Secondary | ICD-10-CM

## 2018-11-14 MED ORDER — NONFORMULARY OR COMPOUNDED ITEM: 1 refills | Status: DC

## 2018-11-15 ENCOUNTER — Other Ambulatory Visit: Payer: Self-pay

## 2018-11-15 DIAGNOSIS — N952 Postmenopausal atrophic vaginitis: Secondary | ICD-10-CM

## 2018-11-15 MED ORDER — NONFORMULARY OR COMPOUNDED ITEM: 1 refills | Status: DC

## 2018-11-15 NOTE — Telephone Encounter (Signed)
done

## 2018-11-15 NOTE — Telephone Encounter (Signed)
This medication was sent to the wrong pharmacy and needs to be sent asap to  [facility], [place] [phone] (Phone) [phone] (Fax)   / please advise

## 2018-11-22 ENCOUNTER — Ambulatory Visit: Payer: Medicare Other | Admitting: Psychology

## 2018-11-24 IMAGING — MR MR LUMBAR SPINE W/O CM
5 series · 43 of 48 positions shown · non-contrast
Comparison: Radiographs dated 08/24/2016

CLINICAL DATA: Low back pain and left sciatica. Spondylolisthesis
at L5-S1.

EXAM:
MRI LUMBAR SPINE WITHOUT CONTRAST
TECHNIQUE: Multiplanar, multisequence MR imaging of the lumbar spine was
performed. No intravenous contrast was administered.

[Series 3: tirm sag · sagittal · 4.0mm · 0.55mm/px · 6 of 13 slices shown]
[im 1/13]
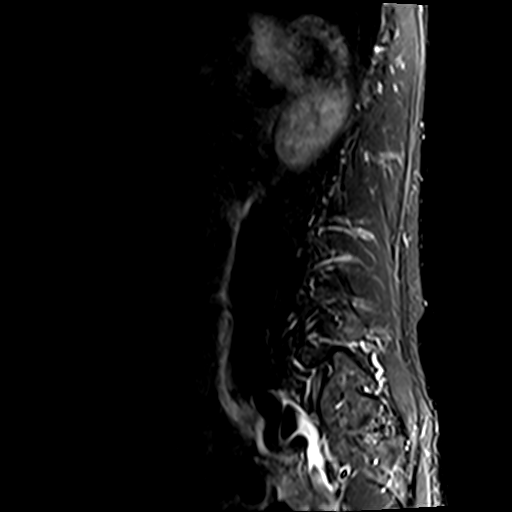
[im 3/13]
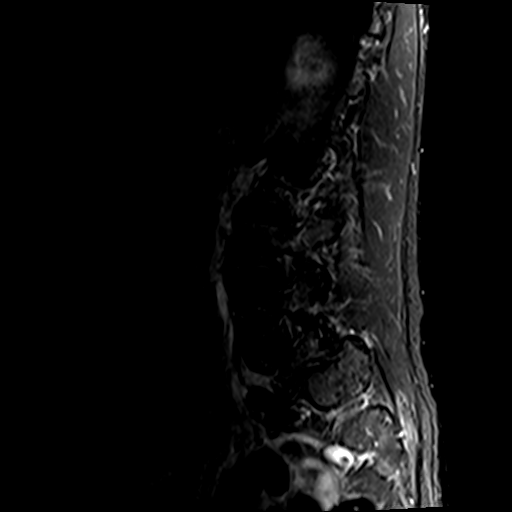
[im 5/13]
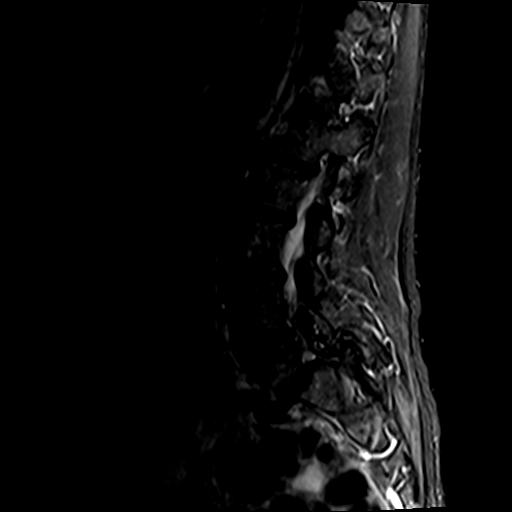
[im 8/13]
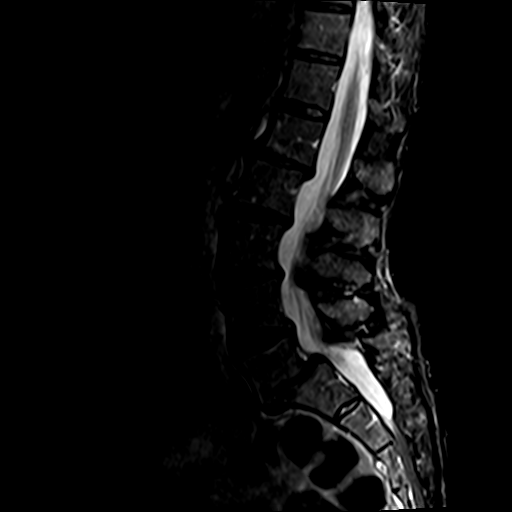
[im 10/13]
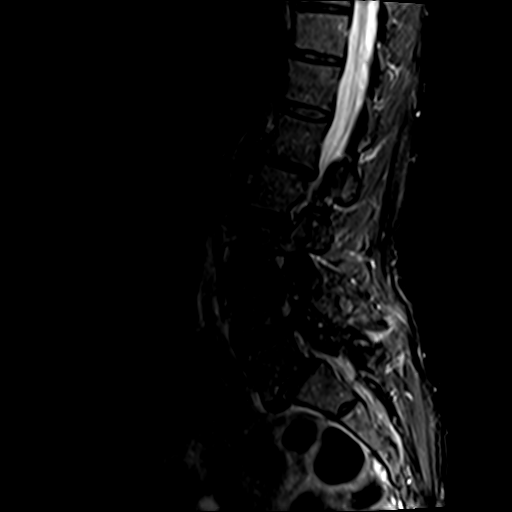
[im 13/13]
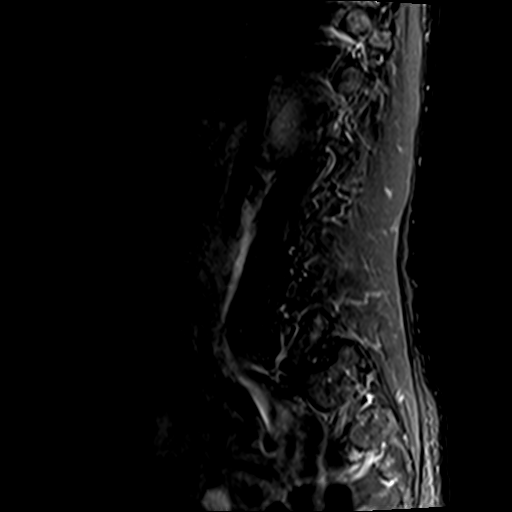

[Series 4: T2 · sagittal · 4.0mm · 0.88mm/px · 6 of 13 slices shown (1 of 2)]
[im 1/13]
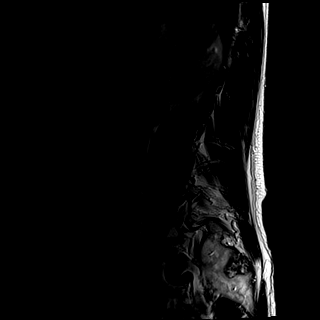
[im 3/13]
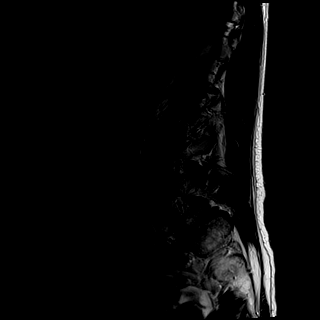
[im 5/13]
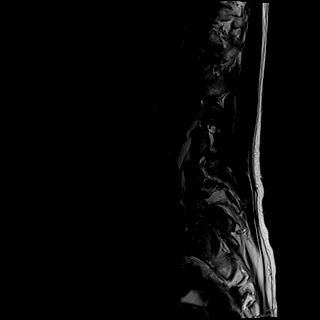
[im 8/13]
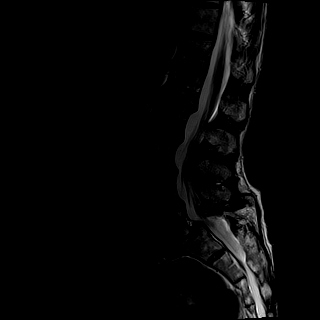
[im 10/13]
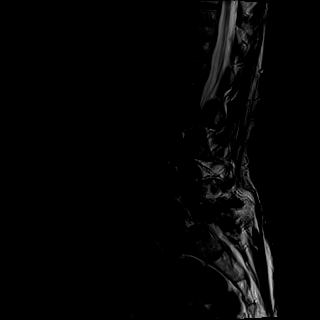
[im 13/13]
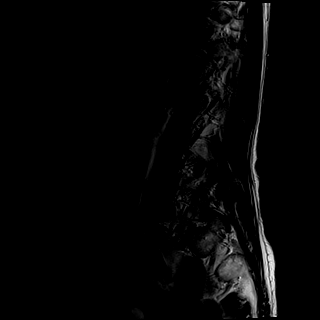

[Series 5: T1 · sagittal · 4.0mm · 0.88mm/px · 6 of 13 slices shown (1 of 2)]
[im 1/13]
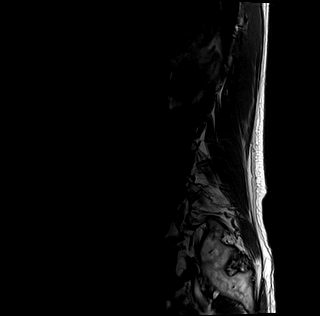
[im 3/13]
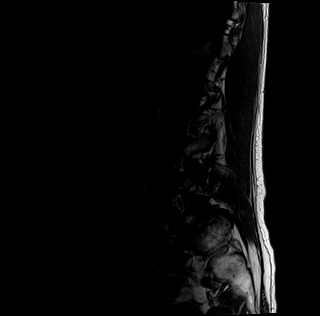
[im 5/13]
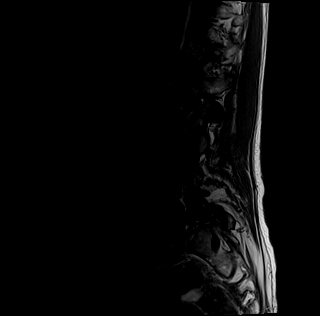
[im 8/13]
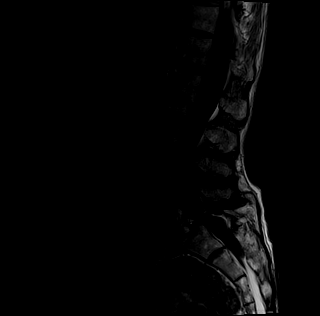
[im 10/13]
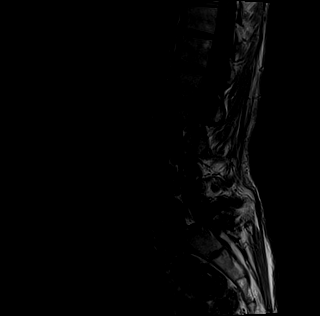
[im 13/13]
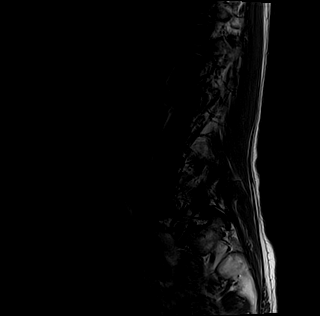

[Series 6: T1 · axial · 4.0mm · 0.78mm/px · z∈[+3,+183]mm · 10 of 31 slices shown (2 of 2)]
[im 1/31]
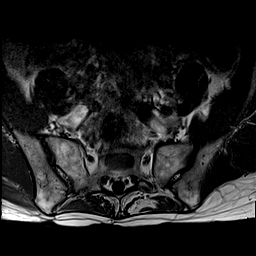
[im 3/31]
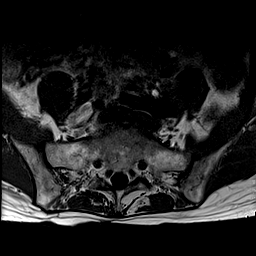
[im 5/31]
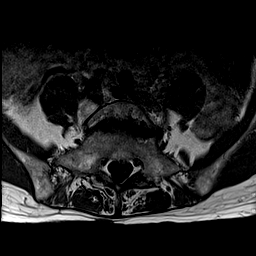
[im 9/31]
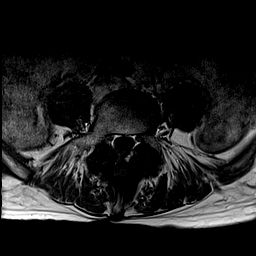
[im 13/31]
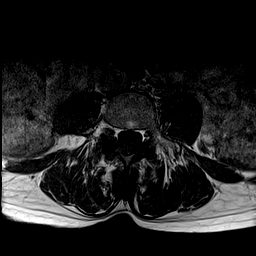
[im 16/31]
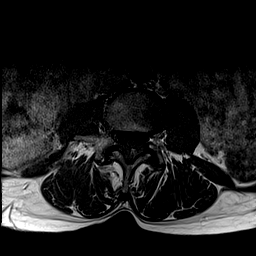
[im 18/31]
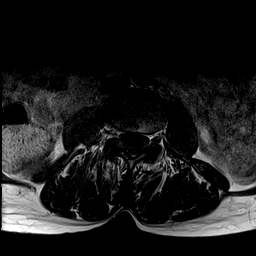
[im 22/31]
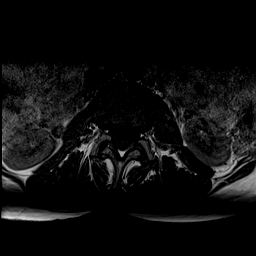
[im 26/31]
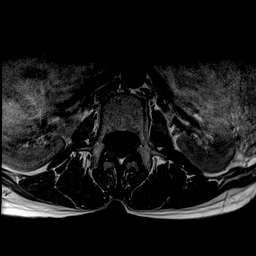
[im 31/31]
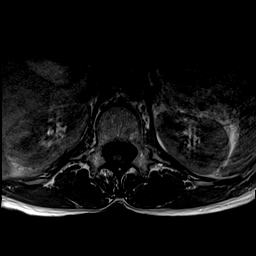

[Series 7: T2 · axial · 4.0mm · 0.78mm/px · z∈[+3,+183]mm · 15 of 31 slices shown (2 of 2)]
[im 1/31]
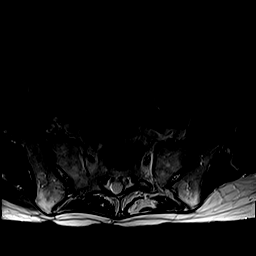
[im 3/31]
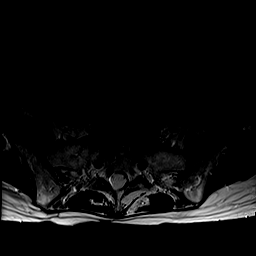
[im 5/31]
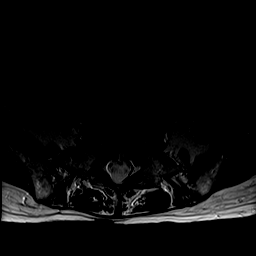
[im 7/31]
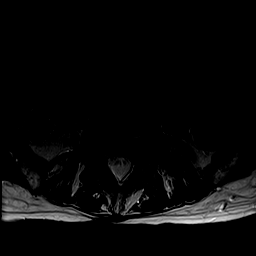
[im 9/31]
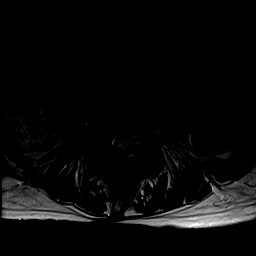
[im 11/31]
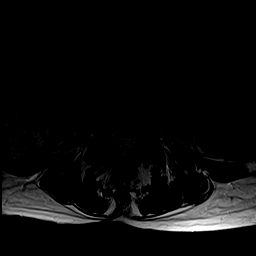
[im 13/31]
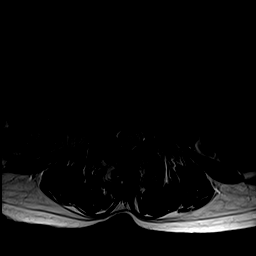
[im 16/31]
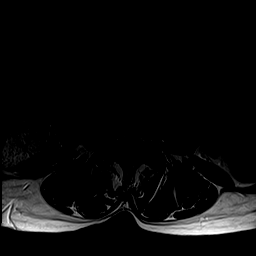
[im 18/31]
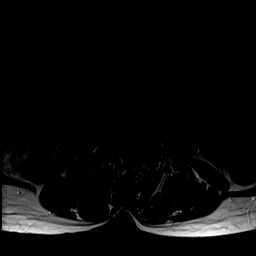
[im 20/31]
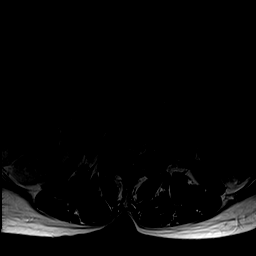
[im 22/31]
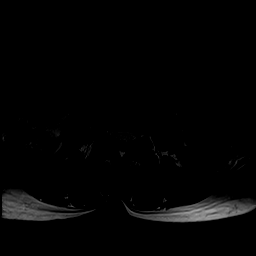
[im 24/31]
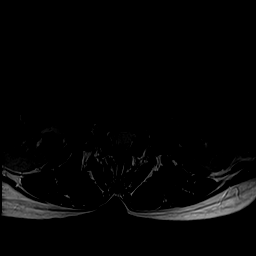
[im 26/31]
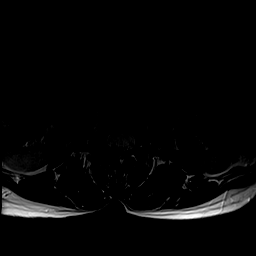
[im 28/31]
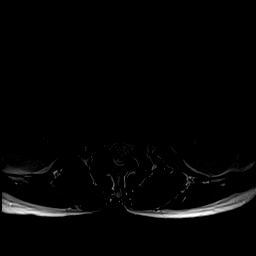
[im 31/31]
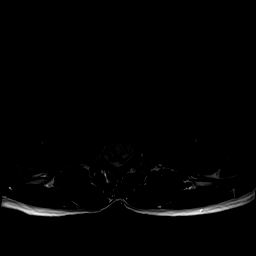

[43 of 48 positions shown; findings below may reference images not displayed]

FINDINGS: Segmentation:  Standard.

Alignment: 11 mm spondylolisthesis at L5-S1 due to bilateral pars
defects. Retrolisthesis of L1 on L2 and of L2 on L3.

Vertebrae:  No fracture, evidence of discitis, or bone lesion.

Conus medullaris: Extends to the L2 level and appears normal.

Paraspinal and other soft tissues: Negative.

Disc levels:

T12-L1:  Normal.

L1-2: Disc desiccation and disc space narrowing with a 3 mm
retrolisthesis. Small broad-based soft disc protrusion of the
uncovered disc without neural impingement.

L2-3: Disc desiccation and disc space narrowing with a small
broad-based disc bulge with 2 mm retrolisthesis. No neural
impingement.

L3-4: Disc space narrowing. Small broad-based disc bulge.
Hypertrophy of the ligamentum flavum and facet joints create mild
spinal stenosis with no focal neural impingement.

L4-5: 1.5 mm spondylolisthesis with a small broad-based protrusion
of the uncovered disc. Hypertrophy of the ligamentum flavum and
facet joints combine to create mild spinal stenosis with no focal
neural impingement. Slight left foraminal stenosis. However, the L4
nerve exits without impingement.

L5-S1: 11 mm spondylolisthesis secondary to bilateral pars defects.
Minimal bulging of the central portion of the uncovered disc. Severe
degenerative changes of the facet joints and at the pars defects
with slight narrowing of the transverse dimension spinal canal
without significant stenosis. Severe bilateral foraminal stenosis,
left worse than right which could affect either or both L5 nerves.
IMPRESSION: 1. Spondylolisthesis of L5 on S1 with severe bilateral foraminal
stenosis, left greater than right which could affect either or both
L5 nerves.
2. Diffuse degenerative disc disease in the lumbar spine without
focal neural impingement of both L5-S1.

## 2018-11-29 ENCOUNTER — Ambulatory Visit: Payer: Medicare Other | Admitting: Psychology

## 2018-12-05 ENCOUNTER — Ambulatory Visit (INDEPENDENT_AMBULATORY_CARE_PROVIDER_SITE_OTHER): Payer: Medicare Other | Admitting: Neurology

## 2018-12-05 ENCOUNTER — Other Ambulatory Visit: Payer: Self-pay

## 2018-12-05 DIAGNOSIS — G4731 Primary central sleep apnea: Secondary | ICD-10-CM | POA: Diagnosis not present

## 2018-12-05 DIAGNOSIS — G4709 Other insomnia: Secondary | ICD-10-CM

## 2018-12-05 DIAGNOSIS — R0683 Snoring: Secondary | ICD-10-CM

## 2018-12-05 DIAGNOSIS — G4701 Insomnia due to medical condition: Secondary | ICD-10-CM

## 2018-12-06 ENCOUNTER — Ambulatory Visit: Payer: Medicare Other | Admitting: Psychology

## 2018-12-13 NOTE — Procedures (Signed)
PATIENT'S NAME:  Kristin Tapia, Kristin Tapia DOB:      1945-09-16      MR#:    [id number]     DATE OF RECORDING: 12/05/2018 REFERRING M.D.:  Grant Fontana MD Study Performed:   Baseline Polysomnogram HISTORY:  Prof. LURLEEN SOLTERO is a 73 y.o. female patient who had been followed here until 6 or 7 years ago. She is seen on 08-14-2018 upon referral by Dr. Pamella Pert to see if aside from pain there may be other organic reasons for insomnia.  The patient retired from [employer] in 2013, went off Lunesta and was just not as worried about getting enough sleep- she was less anxious. She did well until 2 years ago when her former colleague's health declined. She became the POA for her and found a very stressful financial mess- and was finally by December able to involve adult protective services, but the stress was immense.     Chief complaint according to patient: "I never had a sleep study but still sleep rather poorly", " I feel sleepy on gabapentin- but can't nap ".   Sleep habits are as follows: Dinner time always from 5.30 to 7 PM. She closes all electronics by 8 PM, and bedtime is after 9 PM, she reads in bed -relaxing material.  Sometimes she sleeps within 20 minutes, sometimes 60 minutes. Sciatic pain wakes her frequently, in spite of gabapentin and tramadol and heating pads. She sleeps best on her back, with a tilt to the right shoulder, and alternates with the left- helping her snoring to be less loud. She needs to urinate up to 3 times. She rises finally at 6 AM, but is awake before that. No daytime naps.    Sleep medical history: The patient had a tonsillectomy in childhood, appendectomy and oophorectomy in 1981 or thereabouts, she had various gynecological procedures and the reattachment of a detached retina in 2008.  Chronic insomnia, anxiety, pain related insomnia, snoring, Thyroid disease, Osteopenia, Osteoporosis, hip pain and Asthma.    The patient endorsed the Epworth Sleepiness Scale at 3 points.   The  patient's weight 132 pounds with a height of 64 (inches), resulting in a BMI of 22.6 kg/m2. The patient's neck circumference measured 13 inches.  CURRENT MEDICATIONS: Tylenol, Xanax, Temovate, Allegra, Neurontin, Atrovent, Mobic, Ultram   PROCEDURE:  This is a multichannel digital polysomnogram utilizing the Somnostar 11.2 system.  Electrodes and sensors were applied and monitored per AASM Specifications.   EEG, EOG, Chin and Limb EMG, were sampled at 200 Hz.  ECG, Snore and Nasal Pressure, Thermal Airflow, Respiratory Effort, CPAP Flow and Pressure, Oximetry was sampled at 50 Hz. Digital video and audio were recorded.      BASELINE STUDY: Lights Out was at 21:50 and Lights On at 05:05.  Total recording time (TRT) was 435.5 minutes, with a total sleep time (TST) of 369 minutes.   The patient's sleep latency was 34 minutes.  REM latency was 79.5 minutes.  The sleep efficiency was 84.7 %.     SLEEP ARCHITECTURE: WASO (Wake after sleep onset) was 42 minutes.  There were 11.5 minutes in Stage N1, 46 minutes Stage N2, 213.5 minutes Stage N3 and 98 minutes in Stage REM.  The percentage of Stage N1 was 3.1%, Stage N2 was 12.5%, Stage N3 was 57.9% and Stage R (REM sleep) was 26.6%.   RESPIRATORY ANALYSIS:  There were a total of 5 respiratory events:  0 obstructive apneas, 5 central apneas and 0 hypopneas.  The total APNEA/HYPOPNEA INDEX (AHI) was 0.8 /hour.  2 events occurred in REM sleep and 3 events in NREM. The REM AHI was 0. 1.2 /hour, versus a non-REM AHI of .7. The patient spent 195.5 minutes of total sleep time in the supine position and 174 minutes in non-supine. The supine AHI was 1.5 versus a non-supine AHI of 0.0.  OXYGEN SATURATION & C02:  The Wake baseline 02 saturation was 98%, with the lowest being 90%. Time spent below 89% saturation equaled 0 minutes.  The arousals were noted as: 29 were spontaneous, 0 were associated with PLMs or with respiratory events. The patient had a total of 17  Periodic Limb Movements.  The Periodic Limb Movement (PLM) index was 2.8 and the PLM Arousal index was 0/hour.   Audio and video analysis did not show any abnormal or unusual movements, behaviors, phonations or vocalizations. Irregular snoring is noted, loud at times,  The patient took two bathroom breaks. EKG was in keeping with normal sinus rhythm (NSR). Post-study, the patient indicated that sleep was better than usual.    IMPRESSION:  1. No evidence of Obstructive Sleep Apnea(OSA), of Periodic Limb Movement Disorder (PLMD) 2. Primary Snoring, not continuously, and no hypoxemia of clinical significance. 3. normal EKG  RECOMMENDATIONS:  Sleep was not very fragmented and sleep architecture showed normal REM sleep distribution. I am unable to identify another trigger but pain and nocturia for the breathing unrelated arousals.   I certify that I have reviewed the entire raw data recording prior to the issuance of this report in accordance with the Standards of Accreditation of the American Academy of Sleep Medicine (AASM)  Larey Seat, MD   12-12-2018  Diplomat, American Board of Psychiatry and Neurology  Diplomat, American Board of [place] Director, [place] [facility] [employer]

## 2018-12-16 ENCOUNTER — Encounter: Payer: Self-pay | Admitting: Neurology

## 2018-12-16 LAB — HM MAMMOGRAPHY

## 2018-12-25 ENCOUNTER — Other Ambulatory Visit: Payer: Self-pay | Admitting: Family Medicine

## 2019-01-06 ENCOUNTER — Other Ambulatory Visit: Payer: Self-pay | Admitting: Family Medicine

## 2019-01-17 ENCOUNTER — Encounter: Payer: Self-pay | Admitting: Family Medicine

## 2019-01-17 ENCOUNTER — Telehealth (INDEPENDENT_AMBULATORY_CARE_PROVIDER_SITE_OTHER): Payer: Medicare Other | Admitting: Family Medicine

## 2019-01-17 VITALS — Temp 97.4°F

## 2019-01-17 DIAGNOSIS — M5136 Other intervertebral disc degeneration, lumbar region: Secondary | ICD-10-CM | POA: Diagnosis not present

## 2019-01-17 DIAGNOSIS — M150 Primary generalized (osteo)arthritis: Secondary | ICD-10-CM

## 2019-01-17 DIAGNOSIS — M159 Polyosteoarthritis, unspecified: Secondary | ICD-10-CM

## 2019-01-17 DIAGNOSIS — G4700 Insomnia, unspecified: Secondary | ICD-10-CM

## 2019-01-17 MED ORDER — FAMOTIDINE 20 MG PO TABS: 20.0000 mg | ORAL_TABLET | Freq: Every day | ORAL | 2 refills | Status: DC

## 2019-01-17 MED ORDER — FLUCONAZOLE 150 MG PO TABS: 150.0000 mg | ORAL_TABLET | Freq: Once | ORAL | 0 refills | Status: AC

## 2019-01-17 NOTE — Progress Notes (Signed)
Virtual Visit Note  I connected with patient on 01/17/19 at 528pm by phone and verified that I am speaking with the correct person using two identifiers. Kristin Tapia is currently located at home and patient is currently with them during visit. The provider, Rutherford Guys, MD is located in their office at time of visit.  I discussed the limitations, risks, security and privacy concerns of performing an evaluation and management service by telephone and the availability of in person appointments. I also discussed with the patient that there may be a patient responsible charge related to this service. The patient expressed understanding and agreed to proceed.   CC: routine followup  HPI ? PMH: chronic pain, insomnia, anxiety  Last OV Jan 2020 Referred to sleep - normal sleep study, arousal from pain  She reports that anxiety has significantly improved after she was able to resolve POA issues  Pain is main limiting factor for quality sleep Pain is in her left hip and buttock, sometimes goes down into her ankle and toes Lying down is not comfortable, many times needs to wakes up and stretch Getting back to sleep is difficult Had been taking meloxicam 7.5mg  at bedtime so previous PCP started her on tramadol 50mg  at bedtime Tramadol is less effective then meloxicam Takes gabapentin takes 300mg  within couple of hours at night, takes around 900-1200mg , having tremors as side effect Does better with gabapentin and meloxicam During the day is able to cope better with the pain She is also wondering about low dose naltrexone for pain  Requesting refill of diflucan for vaginal yeast infection  Lab Results  Component Value Date   CREATININE 0.95 07/02/2018   BUN 15 07/02/2018   NA 139 07/02/2018   K 4.1 07/02/2018   CL 99 07/02/2018   CO2 25 07/02/2018   Lab Results  Component Value Date   ALT 20 07/02/2018   AST 18 07/02/2018   ALKPHOS 42 07/02/2018   BILITOT 0.3 07/02/2018     Allergies  Allergen Reactions  . Sulfonamide Derivatives     Prior to Admission medications   Medication Sig Start Date End Date Taking? Authorizing Provider  ACETAMINOPHEN PO Take 1,000 mg by mouth daily.    Yes [provider]  ALPRAZolam Duanne Moron) 0.5 MG tablet Take 0.5 tablets (0.25 mg total) by mouth at bedtime as needed for anxiety. 07/02/18  Yes Rutherford Guys, MD  Calcium-Magnesium-Vitamin D (CALCIUM 1200+D3 PO) 1 tablet daily. 05/10/18  Yes [provider]  clobetasol (TEMOVATE) 0.05 % external solution APPLY TO AFFECTED AREA 1 TO 3 TIMES A DAY FOR 10 DAYS 04/20/17  Yes [provider]  EPINEPHrine 0.3 mg/0.3 mL IJ SOAJ injection AS DIRECTED AS NEEDED FOR INJECTION 30 12/04/18  Yes [provider]  fexofenadine (ALLEGRA) 180 MG tablet Take 180 mg by mouth every morning.   Yes [provider]  gabapentin (NEURONTIN) 300 MG capsule TAKE 1 TO 2 CAPSULES BY MOUTH 3 TIMES DAILY AS NEEDED. 12/25/18  Yes Rutherford Guys, MD  ipratropium (ATROVENT) 0.03 % nasal spray Place 2 sprays into the nose 3 (three) times daily.   Yes [provider]  lidocaine (LMX) 4 % cream Apply 1 application topically as needed.   Yes [provider]  Lifitegrast Shirley Friar) 5 % SOLN Apply to eye.   Yes [provider]  MAGNESIUM GLYCINATE PLUS PO Take 400 mg by mouth.   Yes [provider]  meloxicam (MOBIC) 7.5 MG tablet Take  1 tablet (7.5 mg total) by mouth daily. Patient taking differently: Take 7.5 mg by mouth daily as needed.  07/02/18  Yes Rutherford Guys, MD  NONFORMULARY OR COMPOUNDED ITEM Estradiol 0.2mg /ml cream, insert 33ml vaginally at bedtime twice a week 11/15/18  Yes Rutherford Guys, MD  Omega-3 Fatty Acids (FISH OIL) 1200 MG CAPS Take 1,200 mg by mouth daily. Patient takes 2 gel caps daily   Yes [provider]  traMADol (ULTRAM) 50 MG tablet Take 1 tablet (50 mg total) by mouth every 8 (eight) hours as  needed. Patient taking differently: Take 50 mg by mouth daily.  07/02/18  Yes Rutherford Guys, MD    Past Medical History:  Diagnosis Date  . Abnormality of gait 03/11/2009  . Allergy    immunotherapy/[facility] [place].  [place] Anxiety   . Arthritis   . Asthma    childhood  . Cataract   . Cavus deformity of foot, acquired 03/11/2009  . Cystocele   . Detached retina   . GERD (gastroesophageal reflux disease)   . Hip pain 01/17/2013  . Metatarsalgia 03/11/2009  . Osteoporosis   . Postmenopausal atrophic vaginitis   . Rectocele   . Restless leg syndrome    L leg; s/p consultation by Dohmeier/Neurology.  . Sciatica of left side 01/17/2010  . Squamous cell carcinoma of skin 01/25/2015   L shoulder  . Thyroid disease     Past Surgical History:  Procedure Laterality Date  . APPENDECTOMY    . COLONOSCOPY  04/2009   Int & Ext Hemorrhoids, L sided diverticula.  [facility].  Repeat 5 years  . ESOPHAGOGASTRODUODENOSCOPY  04/2009   tiny HH, minimal antritis, gastric polyps. Magod.  Marland Kitchen EYE SURGERY    . MYOMECTOMY     [place].  . TONSILLECTOMY      Social History   Tobacco Use  . Smoking status: Never Smoker  . Smokeless tobacco: Never Used  Substance Use Topics  . Alcohol use: Yes    Comment: 1 glass of wine a mth    Family History  Problem Relation Age of Onset  . Arthritis Mother        mild  . Hypertension Mother   . Dementia Mother   . Cancer Mother 29       breast cancer  . Hyperlipidemia Mother   . Heart disease Mother   . COPD Father   . Stroke Father   . Heart disease Father   . Seizures Brother   . Cancer Brother   . Osteoporosis Neg Hx     ROS Per hpi  Objective  Vitals as reported by the patient: none   ASSESSMENT and PLAN  1. Degenerative lumbar disc 2. Primary osteoarthritis involving multiple joints 3. Insomnia, unspecified type Uncontrolled. Discussed trial of gabapentin 600mg  with meloxicam and pepcid at bedtime. Can repeat gabapentin if needed.  Reviewed r/se/b. Adding pepcid for GI protection. She will look into compounding pharmacy, trial of low dose naltrexone is reasonable, very low risk profile. Patient understands reports come from case studies which is not considered evidence.   Other orders  - famotidine (PEPCID) 20 MG tablet; Take 1 tablet (20 mg total) by mouth at bedtime. - fluconazole (DIFLUCAN) 150 MG tablet; Take 1 tablet (150 mg total) by mouth once for 1 dose.  FOLLOW-UP: 6 weeks   The above assessment and management plan was discussed with the patient. The patient verbalized understanding of and has agreed to the management plan. Patient is aware  to call the clinic if symptoms persist or worsen. Patient is aware when to return to the clinic for a follow-up visit. Patient educated on when it is appropriate to go to the emergency department.    I provided 22 minutes of non-face-to-face time during this encounter.  Rutherford Guys, MD Primary Care at [place], [place] [address] Ph.  [phone] Fax [phone]

## 2019-01-17 NOTE — Patient Instructions (Signed)
° ° ° °  If you have lab work done today you will be contacted with your lab results within the next 2 weeks.  If you have not heard from us then please contact us. The fastest way to get your results is to register for My Chart. ° ° °IF you received an x-ray today, you will receive an invoice from [facility]. Please contact [facility] at [phone] with questions or concerns regarding your invoice.  ° °IF you received labwork today, you will receive an invoice from [facility]. Please contact [facility] at [phone] with questions or concerns regarding your invoice.  ° °Our billing staff will not be able to assist you with questions regarding bills from these companies. ° °You will be contacted with the lab results as soon as they are available. The fastest way to get your results is to activate your My Chart account. Instructions are located on the last page of this paperwork. If you have not heard from us regarding the results in 2 weeks, please contact this office. °  ° ° ° °

## 2019-01-17 NOTE — Progress Notes (Signed)
Discuss pain medications  Tramadol Acetaminophen Meloxicam Gabapentin

## 2019-01-20 ENCOUNTER — Encounter: Payer: Self-pay | Admitting: Family Medicine

## 2019-01-21 MED ORDER — NALTREXONE POWD: 1.0000 mg | Freq: Every day | 2 refills | Status: DC

## 2019-02-13 ENCOUNTER — Other Ambulatory Visit: Payer: Self-pay | Admitting: Family Medicine

## 2019-02-13 NOTE — Telephone Encounter (Signed)
pmp reviewd, appropriate meds refilled 

## 2019-02-13 NOTE — Telephone Encounter (Signed)
This refill cannot be delegated   Requested Prescriptions  Pending Prescriptions Disp Refills  . traMADol (ULTRAM) 50 MG tablet [Pharmacy Med Name: TRAMADOL HCL 50 MG TABLET] 60 tablet     Sig: TAKE 1 TABLET (50 MG TOTAL) BY MOUTH EVERY 8 (EIGHT) HOURS AS NEEDED.     Not Delegated - Analgesics:  Opioid Agonists Failed - 02/13/2019  3:28 PM      Failed - This refill cannot be delegated      Failed - Urine Drug Screen completed in last 360 days.      Failed - Valid encounter within last 6 months    Recent Outpatient Visits          7 months ago Medication monitoring encounter   [facility], Lilia Argue, MD   7 months ago Encounter for [employer] annual wellness exam   [facility], Lilia Argue, MD   10 months ago Sciatica of left side   [facility], Lilia Argue, MD   11 months ago Serum potassium elevated   [facility] at [place], MD   11 months ago Syncope, unspecified syncope type   Primary Care at [facility], Reather Laurence, PA-C      Future Appointments            In 2 weeks Rutherford Guys, MD Primary Care at [place], [facility]

## 2019-02-28 ENCOUNTER — Other Ambulatory Visit: Payer: Self-pay

## 2019-02-28 ENCOUNTER — Ambulatory Visit (INDEPENDENT_AMBULATORY_CARE_PROVIDER_SITE_OTHER): Payer: Medicare Other | Admitting: Family Medicine

## 2019-02-28 ENCOUNTER — Encounter: Payer: Self-pay | Admitting: Family Medicine

## 2019-02-28 VITALS — BP 126/77 | HR 77 | Temp 99.0°F | Ht 64.0 in | Wt 126.0 lb

## 2019-02-28 DIAGNOSIS — Z5181 Encounter for therapeutic drug level monitoring: Secondary | ICD-10-CM

## 2019-02-28 DIAGNOSIS — M159 Polyosteoarthritis, unspecified: Secondary | ICD-10-CM

## 2019-02-28 DIAGNOSIS — M150 Primary generalized (osteo)arthritis: Secondary | ICD-10-CM

## 2019-02-28 MED ORDER — MELOXICAM 7.5 MG PO TABS: 7.5000 mg | ORAL_TABLET | Freq: Every day | ORAL | 1 refills | Status: DC

## 2019-02-28 MED ORDER — NALTREXONE POWD: 3.0000 mg | Freq: Every day | 2 refills | Status: DC

## 2019-02-28 MED ORDER — GABAPENTIN 300 MG PO CAPS: 900.0000 mg | ORAL_CAPSULE | Freq: Every day | ORAL | 3 refills | Status: DC

## 2019-02-28 NOTE — Progress Notes (Signed)
9/4/20202:54 PM  KU. REPINSKI 12/01/1945, 73 y.o., female [id number]  Chief Complaint  Patient presents with  . Follow-up    med review of mobic, naltrexone and tramadol. and labwork    HPI:   Patient is a 73 y.o. female with past medical history significant for chronic pain, insomnia and anxiety who presents today for folllowup  Last OV July 2020 - telemedicine Increased gabapentin 600mg  at bedtime, with meloxicam and pepcid. Trial of low dose naltrexone Called in - meloxicam caused wheezing, stopped, added tramadol  Since last OV she has been taking meloxicam during lunch, no wheezing or significant gerd, she feels that meloxicam is much better at providing pain control than tramadol She has increased gabapentin to 900mg  on most nights, wakes up ok Most nights getting 7 hours She did get naltrexone, by itself it did not provide any pain relief, she did not try it with meloxicam/gabapentin She is still concerned about daily use of meloxicam  Depression screen [facility] 2/9 02/28/2019 01/17/2019 04/03/2018  Decreased Interest 0 0 0  Down, Depressed, Hopeless 0 0 2  PHQ - 2 Score 0 0 2  Altered sleeping - - 3  Tired, decreased energy - - 0  Change in appetite - - 0  Feeling bad or failure about yourself  - - 0  Trouble concentrating - - 0  Moving slowly or fidgety/restless - - 0  Suicidal thoughts - - 0  PHQ-9 Score - - 5    Fall Risk  02/28/2019 01/17/2019 08/14/2018 04/03/2018 03/04/2018  Falls in the past year? 0 1 1 Yes Yes  Number falls in past yr: 0 0 0 2 or more 1  Injury with Fall? 0 0 1 Yes Yes  Comment - - - head injury and in May injury to left ribs ribs- left May 2019  Risk for fall due to : - - - - -     Allergies  Allergen Reactions  . Sulfonamide Derivatives     Prior to Admission medications   Medication Sig Start Date End Date Taking? Authorizing Provider  ACETAMINOPHEN PO Take 1,000 mg by mouth daily.    Yes [provider]  ALPRAZolam Duanne Moron) 0.5  MG tablet Take 0.5 tablets (0.25 mg total) by mouth at bedtime as needed for anxiety. 07/02/18  Yes Rutherford Guys, MD  Calcium-Magnesium-Vitamin D (CALCIUM 1200+D3 PO) 1 tablet daily. Calcium 650mg  , Vit d mcg 05/10/18  Yes [provider]  clobetasol (TEMOVATE) 0.05 % external solution APPLY TO AFFECTED AREA 1 TO 3 TIMES A DAY FOR 10 DAYS 04/20/17  Yes [provider]  EPINEPHrine 0.3 mg/0.3 mL IJ SOAJ injection AS DIRECTED AS NEEDED FOR INJECTION 30 12/04/18  Yes [provider]  famotidine (PEPCID) 20 MG tablet Take 1 tablet (20 mg total) by mouth at bedtime. Patient taking differently: Take 20 mg by mouth as needed.  01/17/19  Yes Rutherford Guys, MD  fexofenadine (ALLEGRA) 180 MG tablet Take 180 mg by mouth every morning.   Yes [provider]  gabapentin (NEURONTIN) 300 MG capsule TAKE 1 TO 2 CAPSULES BY MOUTH 3 TIMES DAILY AS NEEDED. 12/25/18  Yes Rutherford Guys, MD  ipratropium (ATROVENT) 0.03 % nasal spray Place 2 sprays into the nose 3 (three) times daily.   Yes [provider]  lidocaine (LMX) 4 % cream Apply 1 application topically as needed.   Yes [provider]  Lifitegrast Shirley Friar) 5 % SOLN Place into both eyes 2 (  two) times daily.  11/11/18  Yes [provider]  MAGNESIUM GLYCINATE PLUS PO Take 400 mg by mouth.   Yes [provider]  meloxicam (MOBIC) 7.5 MG tablet Take 1 tablet (7.5 mg total) by mouth daily. Patient taking differently: Take 7.5 mg by mouth daily as needed.  07/02/18  Yes Rutherford Guys, MD  Naltrexone POWD Take 1-2 mg by mouth at bedtime. 01/21/19  Yes Rutherford Guys, MD  NONFORMULARY OR COMPOUNDED ITEM Estradiol 0.2mg /ml cream, insert 6ml vaginally at bedtime twice a week 11/15/18  Yes Rutherford Guys, MD  Omega-3 Fatty Acids (FISH OIL) 1200 MG CAPS Take 1,200 mg by mouth daily. Patient takes 2 gel caps daily   Yes [provider]  traMADol (ULTRAM) 50 MG tablet Take 1 tablet (50 mg  total) by mouth every 8 (eight) hours as needed. 02/13/19  Yes Rutherford Guys, MD    Past Medical History:  Diagnosis Date  . Abnormality of gait 03/11/2009  . Allergy    immunotherapy/[facility] [place].  [place] Anxiety   . Arthritis   . Asthma    childhood  . Cataract   . Cavus deformity of foot, acquired 03/11/2009  . Cystocele   . Detached retina   . GERD (gastroesophageal reflux disease)   . Hip pain 01/17/2013  . Metatarsalgia 03/11/2009  . Osteoporosis   . Postmenopausal atrophic vaginitis   . Rectocele   . Restless leg syndrome    L leg; s/p consultation by Dohmeier/Neurology.  . Sciatica of left side 01/17/2010  . Squamous cell carcinoma of skin 01/25/2015   L shoulder  . Thyroid disease     Past Surgical History:  Procedure Laterality Date  . APPENDECTOMY    . COLONOSCOPY  04/2009   Int & Ext Hemorrhoids, L sided diverticula.  [facility].  Repeat 5 years  . ESOPHAGOGASTRODUODENOSCOPY  04/2009   tiny HH, minimal antritis, gastric polyps. [facility].  Marland Kitchen EYE SURGERY    . MYOMECTOMY     [place].  . TONSILLECTOMY      Social History   Tobacco Use  . Smoking status: Never Smoker  . Smokeless tobacco: Never Used  Substance Use Topics  . Alcohol use: Yes    Comment: 1 glass of wine a mth    Family History  Problem Relation Age of Onset  . Arthritis Mother        mild  . Hypertension Mother   . Dementia Mother   . Cancer Mother 105       breast cancer  . Hyperlipidemia Mother   . Heart disease Mother   . COPD Father   . Stroke Father   . Heart disease Father   . Seizures Brother   . Cancer Brother   . Osteoporosis Neg Hx     ROS Per hpi  OBJECTIVE:  Today's Vitals   02/28/19 1442  BP: 126/77  Pulse: 77  Temp: 99 F (37.2 C)  SpO2: 96%  Weight: 126 lb (57.2 kg)  Height: 5\' 4"  (1.626 m)   Body mass index is 21.63 kg/m.   Physical Exam Vitals signs and nursing note reviewed.  Constitutional:      Appearance: She is well-developed.  HENT:      Head: Normocephalic and atraumatic.     Mouth/Throat:     Pharynx: No oropharyngeal exudate.  Eyes:     General: No scleral icterus.    Conjunctiva/sclera: Conjunctivae normal.     Pupils: Pupils are equal, round, and  reactive to light.  Neck:     Musculoskeletal: Neck supple.  Cardiovascular:     Rate and Rhythm: Normal rate and regular rhythm.     Heart sounds: Normal heart sounds. No murmur. No friction rub. No gallop.   Pulmonary:     Effort: Pulmonary effort is normal.     Breath sounds: Normal breath sounds. No wheezing or rales.  Skin:    General: Skin is warm and dry.  Neurological:     Mental Status: She is alert and oriented to person, place, and time.       No results found for this or any previous visit (from the past 24 hour(s)).  No results found.   ASSESSMENT and PLAN  1. Primary osteoarthritis involving multiple joints 2. Medication monitoring encounter Improved pain control and sleep, cont with trial of naltrexone, cont with daily H2B - Comprehensive metabolic panel - Naltrexone POWD; Take 3 mg by mouth at bedtime. - gabapentin (NEURONTIN) 300 MG capsule; Take 3 capsules (900 mg total) by mouth at bedtime. - meloxicam (MOBIC) 7.5 MG tablet; Take 1 tablet (7.5 mg total) by mouth daily with lunch.  Return in about 3 months (around 05/30/2019).    Rutherford Guys, MD Primary Care at [place], [place] [address] Ph.  [phone] Fax [phone]

## 2019-02-28 NOTE — Patient Instructions (Signed)
     If you have lab work done today you will be contacted with your lab results within the next 2 weeks.  If you have not heard from us then please contact us. The fastest way to get your results is to register for My Chart.   IF you received an x-ray today, you will receive an invoice from [facility]. Please contact [facility] at [phone] with questions or concerns regarding your invoice.   IF you received labwork today, you will receive an invoice from [facility]. Please contact [facility] at [phone] with questions or concerns regarding your invoice.   Our billing staff will not be able to assist you with questions regarding bills from these companies.  You will be contacted with the lab results as soon as they are available. The fastest way to get your results is to activate your My Chart account. Instructions are located on the last page of this paperwork. If you have not heard from us regarding the results in 2 weeks, please contact this office.        If you have lab work done today you will be contacted with your lab results within the next 2 weeks.  If you have not heard from us then please contact us. The fastest way to get your results is to register for My Chart.   IF you received an x-ray today, you will receive an invoice from [facility]. Please contact  Radiology at [phone] with questions or concerns regarding your invoice.   IF you received labwork today, you will receive an invoice from [facility]. Please contact [facility] at [phone] with questions or concerns regarding your invoice.   Our billing staff will not be able to assist you with questions regarding bills from these companies.  You will be contacted with the lab results as soon as they are available. The fastest way to get your results is to activate your My Chart account. Instructions are located on the last page of this paperwork. If you have not heard from us  regarding the results in 2 weeks, please contact this office.     

## 2019-03-01 LAB — COMPREHENSIVE METABOLIC PANEL
ALT: 20 IU/L (ref 0–32)
AST: 20 IU/L (ref 0–40)
Albumin/Globulin Ratio: 2.2 (ref 1.2–2.2)
Albumin: 4.3 g/dL (ref 3.7–4.7)
Alkaline Phosphatase: 43 IU/L (ref 39–117)
BUN/Creatinine Ratio: 26 (ref 12–28)
BUN: 21 mg/dL (ref 8–27)
Bilirubin Total: 0.2 mg/dL (ref 0.0–1.2)
CO2: 25 mmol/L (ref 20–29)
Calcium: 9.5 mg/dL (ref 8.7–10.3)
Chloride: 100 mmol/L (ref 96–106)
Creatinine, Ser: 0.82 mg/dL (ref 0.57–1.00)
GFR calc Af Amer: 82 mL/min/{1.73_m2} (ref >59)
GFR calc non Af Amer: 71 mL/min/{1.73_m2} (ref >59)
Globulin, Total: 2 g/dL (ref 1.5–4.5)
Glucose: 96 mg/dL (ref 65–99)
Potassium: 4.4 mmol/L (ref 3.5–5.2)
Sodium: 137 mmol/L (ref 134–144)
Total Protein: 6.3 g/dL (ref 6.0–8.5)

## 2019-04-06 ENCOUNTER — Encounter: Payer: Self-pay | Admitting: Family Medicine

## 2019-05-05 ENCOUNTER — Other Ambulatory Visit: Payer: Self-pay | Admitting: Family Medicine

## 2019-05-05 NOTE — Telephone Encounter (Signed)
Requested medication (s) are due for refill today: yes  Requested medication (s) are on the active medication list: yes  Last refill:  09/30/2018  Future visit scheduled: yes  Notes to clinic: refill cannot be delegated    Requested Prescriptions  Pending Prescriptions Disp Refills   ALPRAZolam (XANAX) 0.5 MG tablet [Pharmacy Med Name: ALPRAZOLAM 0.5 MG TABLET] 15 tablet     Sig: Take 0.5 tablets (0.25 mg total) by mouth at bedtime as needed for anxiety.     Not Delegated - Psychiatry:  Anxiolytics/Hypnotics Failed - 05/05/2019  2:08 PM      Failed - This refill cannot be delegated      Failed - Urine Drug Screen completed in last 360 days.      Passed - Valid encounter within last 6 months    Recent Outpatient Visits          2 months ago Primary osteoarthritis involving multiple joints   [facility] Lilia Argue, MD   3 months ago Degenerative lumbar disc   [facility], Lilia Argue, MD   10 months ago Medication monitoring encounter   [facility], Lilia Argue, MD   10 months ago Encounter for [employer] annual wellness exam   [facility], Lilia Argue, MD   1 year ago Sciatica of left side   [facility], Lilia Argue, MD      Future Appointments            In 3 weeks Rutherford Guys, MD [facility] at [place]

## 2019-05-06 NOTE — Telephone Encounter (Signed)
pmp reviewd, appropriate meds refilled 

## 2019-05-30 ENCOUNTER — Ambulatory Visit: Payer: Medicare Other | Admitting: Family Medicine

## 2019-05-30 ENCOUNTER — Other Ambulatory Visit: Payer: Self-pay

## 2019-05-30 ENCOUNTER — Encounter: Payer: Self-pay | Admitting: Family Medicine

## 2019-05-30 VITALS — BP 100/62 | HR 75 | Temp 98.2°F | Ht 64.0 in | Wt 125.0 lb

## 2019-05-30 DIAGNOSIS — G894 Chronic pain syndrome: Secondary | ICD-10-CM | POA: Diagnosis not present

## 2019-05-30 DIAGNOSIS — M5432 Sciatica, left side: Secondary | ICD-10-CM

## 2019-05-30 DIAGNOSIS — M5136 Other intervertebral disc degeneration, lumbar region: Secondary | ICD-10-CM | POA: Diagnosis not present

## 2019-05-30 MED ORDER — FLUCONAZOLE 150 MG PO TABS: 150.0000 mg | ORAL_TABLET | Freq: Once | ORAL | 0 refills | Status: AC

## 2019-05-30 MED ORDER — DULOXETINE HCL 30 MG PO CPEP: 30.0000 mg | ORAL_CAPSULE | Freq: Every day | ORAL | 3 refills | Status: DC

## 2019-05-30 NOTE — Progress Notes (Signed)
12/4/202011:53 AM  Kristin Tapia 08-27-1945, 73 y.o., female [id number]  Chief Complaint  Patient presents with  . Follow-up    still having the issue of pain, tried stopping them naltexone for 1 wk and the pain was worse  . Medication Management     taking an xtra gabapentin to deal with pain and wants to know when she should schedule awv for 2021    HPI:   Patient is a 73 y.o. female with past medical history significant for chronic pain, insomnia and anxiety who presents today for folllowup  Last OV sept 2020 Sleeping better, taking gabapentin 900mg  on most nights, about twice a week takes a 4th gabapentin Wakes up wo issues Continues to take meloxicam daily  Pain is low back pain with left sciatica Heat pads helps Tosses and turns all night long due to pain, cant get comfortable In the past saw ortho and tried cortisone injection, which provided very short relief Discussed very complicated surgery She never got a second opinion  MRI lumbar spine 2018: IMPRESSION: 1. Spondylolisthesis of L5 on S1 with severe bilateral foraminal stenosis, left greater than right which could affect either or both L5 nerves. 2. Diffuse degenerative disc disease in the lumbar spine without focal neural impingement of both L5-S1.  Lab Results  Component Value Date   CREATININE 0.82 02/28/2019   BUN 21 02/28/2019   NA 137 02/28/2019   K 4.4 02/28/2019   CL 100 02/28/2019   CO2 25 02/28/2019    Depression screen PHQ 2/9 05/30/2019 02/28/2019 01/17/2019  Decreased Interest 0 0 0  Down, Depressed, Hopeless 0 0 0  PHQ - 2 Score 0 0 0  Altered sleeping - - -  Tired, decreased energy - - -  Change in appetite - - -  Feeling bad or failure about yourself  - - -  Trouble concentrating - - -  Moving slowly or fidgety/restless - - -  Suicidal thoughts - - -  PHQ-9 Score - - -    Fall Risk  05/30/2019 02/28/2019 01/17/2019 08/14/2018 04/03/2018  Falls in the past year? 0 0 1 1 Yes  Number  falls in past yr: 0 0 0 0 2 or more  Injury with Fall? 0 0 0 1 Yes  Comment - - - - head injury and in May injury to left ribs  Risk for fall due to : - - - - -     Allergies  Allergen Reactions  . Sulfonamide Derivatives     Prior to Admission medications   Medication Sig Start Date End Date Taking? Authorizing Provider  ACETAMINOPHEN PO Take 1,000 mg by mouth daily.    Yes [provider]  ALPRAZolam (XANAX) 0.5 MG tablet TAKE 0.5 TABLETS (0.25 MG TOTAL) BY MOUTH AT BEDTIME AS NEEDED FOR ANXIETY. 05/06/19  Yes Rutherford Guys, MD  Calcium-Magnesium-Vitamin D (CALCIUM 1200+D3 PO) 1 tablet daily. Calcium 650mg  , Vit d mcg 05/10/18  Yes [provider]  clobetasol (TEMOVATE) 0.05 % external solution APPLY TO AFFECTED AREA 1 TO 3 TIMES A DAY FOR 10 DAYS 04/20/17  Yes [provider]  EPINEPHrine 0.3 mg/0.3 mL IJ SOAJ injection AS DIRECTED AS NEEDED FOR INJECTION 30 12/04/18  Yes [provider]  famotidine (PEPCID) 20 MG tablet Take 1 tablet (20 mg total) by mouth at bedtime. Patient taking differently: Take 20 mg by mouth as needed.  01/17/19  Yes Rutherford Guys, MD  fexofenadine (ALLEGRA) 180 MG tablet  Take 180 mg by mouth every morning.   Yes [provider]  gabapentin (NEURONTIN) 300 MG capsule Take 3 capsules (900 mg total) by mouth at bedtime. 02/28/19  Yes Rutherford Guys, MD  ipratropium (ATROVENT) 0.03 % nasal spray Place 2 sprays into the nose 3 (three) times daily.   Yes [provider]  lidocaine (LMX) 4 % cream Apply 1 application topically as needed.   Yes [provider]  MAGNESIUM GLYCINATE PLUS PO Take 400 mg by mouth.   Yes [provider]  meloxicam (MOBIC) 7.5 MG tablet Take 1 tablet (7.5 mg total) by mouth daily with lunch. 02/28/19  Yes Rutherford Guys, MD  Naltrexone POWD Take 3 mg by mouth at bedtime. 02/28/19  Yes Rutherford Guys, MD  NONFORMULARY OR COMPOUNDED ITEM Estradiol 0.2mg /ml cream,  insert 76ml vaginally at bedtime twice a week 11/15/18  Yes Rutherford Guys, MD  Omega-3 Fatty Acids (FISH OIL) 1200 MG CAPS Take 1,200 mg by mouth daily. Patient takes 2 gel caps daily   Yes [provider]  Lifitegrast Shirley Friar) 5 % SOLN Place into both eyes 2 (two) times daily.  11/11/18   [provider]    Past Medical History:  Diagnosis Date  . Abnormality of gait 03/11/2009  . Allergy    immunotherapy/[facility] [place].  Marland Kitchen Anxiety   . Arthritis   . Asthma    childhood  . Cataract   . Cavus deformity of foot, acquired 03/11/2009  . Cystocele   . Detached retina   . GERD (gastroesophageal reflux disease)   . Hip pain 01/17/2013  . Metatarsalgia 03/11/2009  . Osteoporosis   . Postmenopausal atrophic vaginitis   . Rectocele   . Restless leg syndrome    L leg; s/p consultation by Dohmeier/Neurology.  . Sciatica of left side 01/17/2010  . Squamous cell carcinoma of skin 01/25/2015   L shoulder  . Thyroid disease     Past Surgical History:  Procedure Laterality Date  . APPENDECTOMY    . COLONOSCOPY  04/2009   Int & Ext Hemorrhoids, L sided diverticula.  Magod.  Repeat 5 years  . ESOPHAGOGASTRODUODENOSCOPY  04/2009   tiny HH, minimal antritis, gastric polyps. Magod.  Marland Kitchen EYE SURGERY    . MYOMECTOMY     [place].  . TONSILLECTOMY      Social History   Tobacco Use  . Smoking status: Never Smoker  . Smokeless tobacco: Never Used  Substance Use Topics  . Alcohol use: Yes    Comment: 1 glass of wine a mth    Family History  Problem Relation Age of Onset  . Arthritis Mother        mild  . Hypertension Mother   . Dementia Mother   . Cancer Mother 84       breast cancer  . Hyperlipidemia Mother   . Heart disease Mother   . COPD Father   . Stroke Father   . Heart disease Father   . Seizures Brother   . Cancer Brother   . Osteoporosis Neg Hx     ROS Per hpi  OBJECTIVE:  Today's Vitals   05/30/19 1117  BP: 100/62  Pulse: 75  Temp: 98.2  F (36.8 C)  SpO2: 98%  Weight: 125 lb (56.7 kg)  Height: 5\' 4"  (1.626 m)   Body mass index is 21.46 kg/m.   Physical Exam Vitals signs and nursing note reviewed.  Constitutional:      Appearance: She  is well-developed.  HENT:     Head: Normocephalic and atraumatic.  Eyes:     General: No scleral icterus.    Conjunctiva/sclera: Conjunctivae normal.     Pupils: Pupils are equal, round, and reactive to light.  Neck:     Musculoskeletal: Neck supple.  Pulmonary:     Effort: Pulmonary effort is normal.  Skin:    General: Skin is warm and dry.  Neurological:     Mental Status: She is alert and oriented to person, place, and time.     No results found for this or any previous visit (from the past 24 hour(s)).  No results found.   ASSESSMENT and PLAN  1. Sciatica of left side 2. DDD (degenerative disc disease), lumbar 3. Chronic pain syndrome Uncontrolled. Adding duloxetine, reviewed r/se/b. Keep gabapentin at 900mg . Patient would like to postpone referral at this time given covid surge.   Other orders - DULoxetine (CYMBALTA) 30 MG capsule; Take 1 capsule (30 mg total) by mouth daily.   Return for AWV.    Rutherford Guys, MD Primary Care at [place], [place] [address] Ph.  [phone] Fax [phone]

## 2019-05-30 NOTE — Patient Instructions (Signed)
° ° ° °  If you have lab work done today you will be contacted with your lab results within the next 2 weeks.  If you have not heard from us then please contact us. The fastest way to get your results is to register for My Chart. ° ° °IF you received an x-ray today, you will receive an invoice from [facility]. Please contact [facility] at [phone] with questions or concerns regarding your invoice.  ° °IF you received labwork today, you will receive an invoice from [facility]. Please contact [facility] at [phone] with questions or concerns regarding your invoice.  ° °Our billing staff will not be able to assist you with questions regarding bills from these companies. ° °You will be contacted with the lab results as soon as they are available. The fastest way to get your results is to activate your My Chart account. Instructions are located on the last page of this paperwork. If you have not heard from us regarding the results in 2 weeks, please contact this office. °  ° ° ° °

## 2019-05-31 ENCOUNTER — Encounter: Payer: Self-pay | Admitting: Family Medicine

## 2019-05-31 DIAGNOSIS — M5432 Sciatica, left side: Secondary | ICD-10-CM

## 2019-05-31 DIAGNOSIS — M5136 Other intervertebral disc degeneration, lumbar region: Secondary | ICD-10-CM

## 2019-06-02 ENCOUNTER — Encounter: Payer: Self-pay | Admitting: Family Medicine

## 2019-06-02 MED ORDER — DULOXETINE HCL 20 MG PO CPEP: 20.0000 mg | ORAL_CAPSULE | Freq: Every day | ORAL | 3 refills | Status: DC

## 2019-06-23 ENCOUNTER — Encounter: Payer: Self-pay | Admitting: Family Medicine

## 2019-06-23 MED ORDER — NALTREXONE POWD: 4.0000 mg | Freq: Every day | 2 refills | Status: DC

## 2019-06-24 ENCOUNTER — Encounter: Payer: Medicare Other | Admitting: Family Medicine

## 2019-06-25 ENCOUNTER — Other Ambulatory Visit: Payer: Self-pay | Admitting: Family Medicine

## 2019-06-25 NOTE — Telephone Encounter (Signed)
Pharmacy is requesting changes to current Rx- sent for PCP review of request

## 2019-06-26 ENCOUNTER — Encounter: Payer: Medicare Other | Admitting: Family Medicine

## 2019-07-01 DIAGNOSIS — J301 Allergic rhinitis due to pollen: Secondary | ICD-10-CM | POA: Diagnosis not present

## 2019-07-01 DIAGNOSIS — J3089 Other allergic rhinitis: Secondary | ICD-10-CM | POA: Diagnosis not present

## 2019-07-04 ENCOUNTER — Encounter: Payer: Self-pay | Admitting: Family Medicine

## 2019-07-04 DIAGNOSIS — M8580 Other specified disorders of bone density and structure, unspecified site: Secondary | ICD-10-CM | POA: Diagnosis not present

## 2019-07-04 DIAGNOSIS — M4316 Spondylolisthesis, lumbar region: Secondary | ICD-10-CM | POA: Diagnosis not present

## 2019-07-04 DIAGNOSIS — M5416 Radiculopathy, lumbar region: Secondary | ICD-10-CM | POA: Diagnosis not present

## 2019-07-08 ENCOUNTER — Encounter: Payer: Self-pay | Admitting: Family Medicine

## 2019-07-08 ENCOUNTER — Ambulatory Visit (INDEPENDENT_AMBULATORY_CARE_PROVIDER_SITE_OTHER): Payer: Medicare PPO | Admitting: Family Medicine

## 2019-07-08 ENCOUNTER — Other Ambulatory Visit: Payer: Self-pay

## 2019-07-08 VITALS — BP 120/80 | HR 80 | Temp 97.9°F | Ht 64.0 in | Wt 125.0 lb

## 2019-07-08 DIAGNOSIS — Z791 Long term (current) use of non-steroidal anti-inflammatories (NSAID): Secondary | ICD-10-CM | POA: Diagnosis not present

## 2019-07-08 DIAGNOSIS — Z86010 Personal history of colonic polyps: Secondary | ICD-10-CM | POA: Diagnosis not present

## 2019-07-08 DIAGNOSIS — Z0001 Encounter for general adult medical examination with abnormal findings: Secondary | ICD-10-CM | POA: Diagnosis not present

## 2019-07-08 DIAGNOSIS — Z1211 Encounter for screening for malignant neoplasm of colon: Secondary | ICD-10-CM | POA: Diagnosis not present

## 2019-07-08 DIAGNOSIS — M8589 Other specified disorders of bone density and structure, multiple sites: Secondary | ICD-10-CM

## 2019-07-08 DIAGNOSIS — M5136 Other intervertebral disc degeneration, lumbar region: Secondary | ICD-10-CM

## 2019-07-08 DIAGNOSIS — Z0000 Encounter for general adult medical examination without abnormal findings: Secondary | ICD-10-CM

## 2019-07-08 NOTE — Patient Instructions (Signed)
° ° ° °  If you have lab work done today you will be contacted with your lab results within the next 2 weeks.  If you have not heard from us then please contact us. The fastest way to get your results is to register for My Chart. ° ° °IF you received an x-ray today, you will receive an invoice from [facility]. Please contact [facility] at [phone] with questions or concerns regarding your invoice.  ° °IF you received labwork today, you will receive an invoice from [facility]. Please contact [facility] at [phone] with questions or concerns regarding your invoice.  ° °Our billing staff will not be able to assist you with questions regarding bills from these companies. ° °You will be contacted with the lab results as soon as they are available. The fastest way to get your results is to activate your My Chart account. Instructions are located on the last page of this paperwork. If you have not heard from us regarding the results in 2 weeks, please contact this office. °  ° ° ° °

## 2019-07-08 NOTE — Progress Notes (Signed)
Presents today for [employer] Visit-Subsequent.   Date of last exam: Jul 05 2018  Interpreter used for this visit? n/a  Patient Care Team: Rutherford Guys, MD as PCP - General (Family Medicine) Mosetta Anis, MD as Referring Physician (Allergy) Stefanie Libel, MD as Consulting Physician (Family Medicine) Crista Luria, MD as Consulting Physician (Dermatology) Clent Jacks, MD as Consulting Physician (Ophthalmology) Clarene Essex, MD as Consulting Physician (Gastroenterology) Dohmeier, Asencion Partridge, MD as Consulting Physician (Neurology)   Other items to address today:  Has seen neurosurg - trial of nerve block, possible candidate for surgery Stopped cymbalta due to side effects, nausea, anorgasmia, worsening RLS  Cancer Screening: Cervical: n/a, last pap in 2016, normal Breast: mammo June 2020, normal Colon: colonoscopy March 2016, polyps, repeat in 5 years, Dr Sarina Ser at [place]   Other Screening: Last screening for diabetes: 2019 Last lipid screening: 2018 Dexa: Sept 2019, osteopenia Takes ca/d3, walks d3 5000 units a day  ADVANCE DIRECTIVES: Discussed: yes Patient desires CPR (No ), mechanical ventilation (No ), prolonged artificial support (may include mechanical ventilation, tube/PEG feeding, etc) (No ). On File: yes Materials Provided: n/a  Immunization status:  Immunization History  Administered Date(s) Administered  . Hepatitis A 09/09/2012  . Hepatitis A, Adult 04/16/2013  . Hepatitis B 09/09/2012, 10/14/2012  . Hepatitis B, adult 04/16/2013  . Influenza Split 03/26/2012  . Influenza, High Dose Seasonal PF 03/10/2018, 02/12/2019  . Influenza,inj,Quad PF,6+ Mos 04/16/2013, 03/16/2014, 03/11/2015, 02/27/2017  . Influenza-Unspecified 02/09/2016, 03/10/2018, 02/08/2019  . Pneumococcal Conjugate-13 05/18/2014  . Pneumococcal Polysaccharide-23 03/03/2011  . Td 07/13/2008  . Tdap 09/02/2017  . Zoster 08/23/2007  . Zoster Recombinat (Shingrix)  12/27/2016, 02/24/2017     There are no preventive care reminders to display for this patient.   Functional Status Survey: Is the patient deaf or have difficulty hearing?: No Does the patient have difficulty seeing, even when wearing glasses/contacts?: No Does the patient have difficulty concentrating, remembering, or making decisions?: No Does the patient have difficulty walking or climbing stairs?: No Does the patient have difficulty dressing or bathing?: No Does the patient have difficulty doing errands alone such as visiting a doctor's office or shopping?: No  6CIT Screen 07/08/2019 07/02/2018  What Year? 0 points 0 points  What month? 0 points 0 points  What time? 0 points 0 points  Count back from 20 0 points 0 points  Months in reverse 0 points 0 points  Repeat phrase 0 points 0 points  Total Score 0 0    Home Environment: feels safe at home, married  Urinary Incontinence Screening: denies any issues  Patient Active Problem List   Diagnosis Date Noted  . Metatarsalgia of right foot 09/03/2018  . Degenerative lumbar disc 11/02/2016  . Psychophysiological insomnia 06/26/2016  . Atrophic vaginitis 05/04/2016  . Vaginal moniliasis 05/04/2016  . Seasonal allergic rhinitis due to pollen 05/09/2015  . Asthma, mild intermittent 05/09/2015  . Tendinopathy of right rotator cuff 12/10/2014  . Primary osteoarthritis involving multiple joints 05/23/2014  . Osteopenia 05/23/2014  . Right tennis elbow 03/05/2014  . Pain in joint, ankle and foot 05/06/2013  . Left hip pain 01/17/2013  . Sciatica of left side 01/17/2010  . Enthesopathy of ankle and tarsus 03/11/2009  . Cavus deformity of foot 03/11/2009     Past Medical History:  Diagnosis Date  . Abnormality of gait 03/11/2009  . Allergy    immunotherapy/[facility] [place].  Marland Kitchen Anxiety   . Arthritis   . Asthma  childhood  . Cataract   . Cavus deformity of foot, acquired 03/11/2009  . Cystocele   . Detached retina   .  GERD (gastroesophageal reflux disease)   . Hip pain 01/17/2013  . Metatarsalgia 03/11/2009  . Osteoporosis   . Postmenopausal atrophic vaginitis   . Rectocele   . Restless leg syndrome    L leg; s/p consultation by Dohmeier/Neurology.  . Sciatica of left side 01/17/2010  . Squamous cell carcinoma of skin 01/25/2015   L shoulder  . Thyroid disease      Past Surgical History:  Procedure Laterality Date  . APPENDECTOMY    . COLONOSCOPY  04/2009   Int & Ext Hemorrhoids, L sided diverticula.  Magod.  Repeat 5 years  . ESOPHAGOGASTRODUODENOSCOPY  04/2009   tiny HH, minimal antritis, gastric polyps. Magod.  Marland Kitchen EYE SURGERY    . MYOMECTOMY     [place].  . TONSILLECTOMY       Family History  Problem Relation Age of Onset  . Arthritis Mother        mild  . Hypertension Mother   . Dementia Mother   . Cancer Mother 29       breast cancer  . Hyperlipidemia Mother   . Heart disease Mother   . COPD Father   . Stroke Father   . Heart disease Father   . Seizures Brother   . Cancer Brother   . Osteoporosis Neg Hx      Social History   Socioeconomic History  . Marital status: Married    Spouse name: Not on file  . Number of children: 2  . Years of education: Not on file  . Highest education level: Not on file  Occupational History  . 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  Tobacco Use  . Smoking status: Never Smoker  . Smokeless tobacco: Never Used  Substance and Sexual Activity  . Alcohol use: Yes    Comment: 1 glass of wine a mth  . Drug use: No  . Sexual activity: Not on file  Other Topics Concern  . Not on file  Social History Narrative   Marital status:  Married x 48 years; happily married      Children:  2 children; 1 grandchild Kristin Tapia)      Lives: with husband.      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      Tobacco: never       Alcohol: none      Drugs: none      Exercise: daily.  3-4 miles walking daily; water aerobics three days per week warm water.  1-5-2.0 hours per day. Walks atleast 10,000 steps per day; warm water exercises three days per week.      Advanced Directives:  +Living Will; DNR       ADLs: independent with all ADLs; no assistant devices for ambulation.   Social Determinants of Health   Financial Resource Strain:   . Difficulty of Paying Living Expenses: Not on file  Food Insecurity:   . Worried About Charity fundraiser in the Last Year: Not on file  . Ran Out of Food in the Last Year: Not on file  Transportation Needs:   . Lack of Transportation (Medical): Not on file  . Lack of Transportation (Non-Medical): Not on file  Physical Activity:   . Days of  Exercise per Week: Not on file  . Minutes of Exercise per Session: Not on file  Stress:   . Feeling of Stress : Not on file  Social Connections:   . Frequency of Communication with Friends and Family: Not on file  . Frequency of Social Gatherings with Friends and Family: Not on file  . Attends Religious Services: Not on file  . Active Member of Clubs or Organizations: Not on file  . Attends Archivist Meetings: Not on file  . Marital Status: Not on file  Intimate Partner Violence:   . Fear of Current or Ex-Partner: Not on file  . Emotionally Abused: Not on file  . Physically Abused: Not on file  . Sexually Abused: Not on file     Allergies  Allergen Reactions  . Sulfonamide Derivatives      Prior to Admission medications   Medication Sig Start Date End Date Taking? Authorizing Provider  ACETAMINOPHEN PO Take 1,000 mg by mouth daily.    Yes [provider]  ALPRAZolam (XANAX) 0.5 MG tablet TAKE 0.5 TABLETS (0.25 MG TOTAL) BY MOUTH AT BEDTIME AS NEEDED FOR ANXIETY. Patient taking differently: Take 0.25 mg by mouth at bedtime as needed for sleep.  05/06/19  Yes Rutherford Guys, MD  Calcium-Magnesium-Vitamin D  (CALCIUM 1200+D3 PO) 1 tablet daily. Calcium 650mg  , Vit d mcg 05/10/18  Yes [provider]  clobetasol (TEMOVATE) 0.05 % external solution APPLY TO AFFECTED AREA 1 TO 3 TIMES A DAY FOR 10 DAYS 04/20/17  Yes [provider]  Cranberry (ELLURA) 200 MG CAPS  11/10/17  Yes [provider]  DULoxetine (CYMBALTA) 20 MG capsule TAKE 1 CAPSULE BY MOUTH EVERY DAY 06/25/19  Yes Rutherford Guys, MD  EPINEPHrine 0.3 mg/0.3 mL IJ SOAJ injection AS DIRECTED AS NEEDED FOR INJECTION 30 12/04/18  Yes [provider]  famotidine (PEPCID) 20 MG tablet Take 1 tablet (20 mg total) by mouth at bedtime. Patient taking differently: Take 20 mg by mouth as needed (takes at dinner).  01/17/19  Yes Rutherford Guys, MD  fexofenadine (ALLEGRA) 180 MG tablet Take 180 mg by mouth every morning.   Yes [provider]  gabapentin (NEURONTIN) 300 MG capsule Take 3 capsules (900 mg total) by mouth at bedtime. 02/28/19  Yes Rutherford Guys, MD  ipratropium (ATROVENT) 0.03 % nasal spray Place 2 sprays into the nose 3 (three) times daily.   Yes [provider]  lidocaine (LMX) 4 % cream Apply 1 application topically as needed.   Yes [provider]  Lifitegrast Shirley Friar) 5 % SOLN  06/27/19  Yes [provider]  MAGNESIUM GLYCINATE PLUS PO Take 400 mg by mouth.   Yes [provider]  meloxicam (MOBIC) 7.5 MG tablet Take 1 tablet (7.5 mg total) by mouth daily with lunch. 02/28/19  Yes Rutherford Guys, MD  Naltrexone POWD Take 4 mg by mouth at bedtime. 06/23/19  Yes Rutherford Guys, MD  NONFORMULARY OR COMPOUNDED ITEM Estradiol 0.2mg /ml cream, insert 57ml vaginally at bedtime twice a week 11/15/18  Yes Rutherford Guys, MD  Omega-3 Fatty Acids (FISH OIL) 1200 MG CAPS Take 1,200 mg by mouth daily. Patient takes 2 gel caps daily   Yes [provider]     Depression screen [facility] 2/9 05/30/2019 02/28/2019 01/17/2019 04/03/2018 03/04/2018  Decreased Interest 0 0 0 0  0  Down, Depressed, Hopeless 0 0 0 2 0  PHQ - 2 Score 0 0 0  2 0  Altered sleeping - - - 3 -  Tired, decreased energy - - - 0 -  Change in appetite - - - 0 -  Feeling bad or failure about yourself  - - - 0 -  Trouble concentrating - - - 0 -  Moving slowly or fidgety/restless - - - 0 -  Suicidal thoughts - - - 0 -  PHQ-9 Score - - - 5 -     Fall Risk  07/08/2019 05/30/2019 02/28/2019 01/17/2019 08/14/2018  Falls in the past year? - 0 0 1 1  Number falls in past yr: 0 0 0 0 0  Injury with Fall? 0 0 0 0 1  Comment - - - - -  Risk for fall due to : - - - - -    Review of Systems  Constitutional: Negative for chills and fever.  Respiratory: Negative for cough and shortness of breath.   Cardiovascular: Negative for chest pain, palpitations and leg swelling.  Gastrointestinal: Negative for abdominal pain, nausea and vomiting.  Musculoskeletal: Positive for back pain and falls.  Neurological: Positive for dizziness.  Psychiatric/Behavioral: Negative for depression. The patient is not nervous/anxious and does not have insomnia.   All other systems reviewed and are negative.   PHYSICAL EXAM: BP 120/80   Pulse 80   Temp 97.9 F (36.6 C)   Ht 5\' 4"  (1.626 m)   Wt 125 lb (56.7 kg)   SpO2 100%   BMI 21.46 kg/m    Wt Readings from Last 3 Encounters:  07/08/19 125 lb (56.7 kg)  05/30/19 125 lb (56.7 kg)  02/28/19 126 lb (57.2 kg)      Hearing Screening   125Hz  250Hz  500Hz  1000Hz  2000Hz  3000Hz  4000Hz  6000Hz  8000Hz   Right ear:           Left ear:             Visual Acuity Screening   Right eye Left eye Both eyes  Without correction:     With correction: 20/40 20/30 20/30     Physical Exam  Constitutional: She is oriented to person, place, and time and well-developed, well-nourished, and in no distress.  HENT:  Head: Normocephalic and atraumatic.  Right Ear: Hearing, tympanic membrane, external ear and ear canal normal.  Left Ear: Hearing, tympanic membrane, external ear and  ear canal normal.  Mouth/Throat: Oropharynx is clear and moist.  Eyes: Pupils are equal, round, and reactive to light. EOM are normal.  Neck: No thyromegaly present.  Cardiovascular: Normal rate, regular rhythm, normal heart sounds and intact distal pulses. Exam reveals no gallop and no friction rub.  No murmur heard. Pulmonary/Chest: Effort normal and breath sounds normal. She has no wheezes. She has no rhonchi. She has no rales. Right breast exhibits no mass, no nipple discharge and no skin change. Left breast exhibits no mass, no nipple discharge and no skin change.  Chaperone present  Abdominal: Soft. Bowel sounds are normal. She exhibits no distension and no mass. There is no abdominal tenderness.  Musculoskeletal:        General: No edema. Normal range of motion.     Cervical back: Neck supple.  Lymphadenopathy:    She has no cervical adenopathy.    She has no axillary adenopathy.       Right: No supraclavicular adenopathy present.       Left: No supraclavicular adenopathy present.  Neurological: She is alert and oriented to person, place, and time. She has normal reflexes.  Gait normal.  Skin: Skin is warm and dry.  Psychiatric: Mood and affect normal.  Nursing note and vitals reviewed.     Education/Counseling provided regarding diet and exercise, prevention of chronic diseases, smoking/tobacco cessation, if applicable, and reviewed "Covered Medicare Preventive Services."   ASSESSMENT/PLAN:  1. Encounter for Medicare annual wellness exam No concerns per history or exam. Routine HCM labs ordered. HCM reviewed/discussed. Anticipatory guidance regarding healthy weight, lifestyle and choices given.   2. Colon cancer screening - Ambulatory referral to Gastroenterology  3. History of colonic polyps - Ambulatory referral to Gastroenterology  4. Osteopenia of multiple sites - Vitamin D, 25-hydroxy  5. NSAID long-term use - Basic Metabolic Panel  6. Degenerative lumbar  disc  Other orders

## 2019-07-09 LAB — BASIC METABOLIC PANEL
BUN/Creatinine Ratio: 16 (ref 12–28)
BUN: 15 mg/dL (ref 8–27)
CO2: 27 mmol/L (ref 20–29)
Calcium: 10 mg/dL (ref 8.7–10.3)
Chloride: 99 mmol/L (ref 96–106)
Creatinine, Ser: 0.91 mg/dL (ref 0.57–1.00)
GFR calc Af Amer: 72 mL/min/{1.73_m2} (ref >59)
GFR calc non Af Amer: 63 mL/min/{1.73_m2} (ref >59)
Glucose: 69 mg/dL (ref 65–99)
Potassium: 4.4 mmol/L (ref 3.5–5.2)
Sodium: 139 mmol/L (ref 134–144)

## 2019-07-09 LAB — VITAMIN D 25 HYDROXY (VIT D DEFICIENCY, FRACTURES): Vit D, 25-Hydroxy: 45.3 ng/mL (ref 30.0–100.0)

## 2019-07-18 DIAGNOSIS — J301 Allergic rhinitis due to pollen: Secondary | ICD-10-CM | POA: Diagnosis not present

## 2019-07-18 DIAGNOSIS — J3089 Other allergic rhinitis: Secondary | ICD-10-CM | POA: Diagnosis not present

## 2019-07-20 DIAGNOSIS — Z20828 Contact with and (suspected) exposure to other viral communicable diseases: Secondary | ICD-10-CM | POA: Diagnosis not present

## 2019-07-20 DIAGNOSIS — Z03818 Encounter for observation for suspected exposure to other biological agents ruled out: Secondary | ICD-10-CM | POA: Diagnosis not present

## 2019-07-21 DIAGNOSIS — M5136 Other intervertebral disc degeneration, lumbar region: Secondary | ICD-10-CM | POA: Diagnosis not present

## 2019-07-21 DIAGNOSIS — M5416 Radiculopathy, lumbar region: Secondary | ICD-10-CM | POA: Diagnosis not present

## 2019-07-22 ENCOUNTER — Other Ambulatory Visit: Payer: Self-pay | Admitting: Family Medicine

## 2019-07-22 DIAGNOSIS — Z23 Encounter for immunization: Secondary | ICD-10-CM | POA: Diagnosis not present

## 2019-07-22 DIAGNOSIS — Z85828 Personal history of other malignant neoplasm of skin: Secondary | ICD-10-CM | POA: Diagnosis not present

## 2019-07-22 DIAGNOSIS — L814 Other melanin hyperpigmentation: Secondary | ICD-10-CM | POA: Diagnosis not present

## 2019-07-22 DIAGNOSIS — D225 Melanocytic nevi of trunk: Secondary | ICD-10-CM | POA: Diagnosis not present

## 2019-07-22 DIAGNOSIS — L570 Actinic keratosis: Secondary | ICD-10-CM | POA: Diagnosis not present

## 2019-07-22 DIAGNOSIS — L821 Other seborrheic keratosis: Secondary | ICD-10-CM | POA: Diagnosis not present

## 2019-07-22 DIAGNOSIS — L578 Other skin changes due to chronic exposure to nonionizing radiation: Secondary | ICD-10-CM | POA: Diagnosis not present

## 2019-07-22 DIAGNOSIS — Z86018 Personal history of other benign neoplasm: Secondary | ICD-10-CM | POA: Diagnosis not present

## 2019-07-23 ENCOUNTER — Ambulatory Visit: Payer: Medicare Other

## 2019-08-01 ENCOUNTER — Ambulatory Visit: Payer: Medicare PPO | Attending: Internal Medicine

## 2019-08-01 DIAGNOSIS — Z23 Encounter for immunization: Secondary | ICD-10-CM

## 2019-08-01 NOTE — Progress Notes (Signed)
   Covid-19 Vaccination Clinic  Name:  JAMILEE VOLNER    MRN: [id number] DOB: Jun 08, 1946  08/01/2019  Ms. Bujak was observed post Covid-19 immunization for 30 minutes based on pre-vaccination screening without incidence. She was provided with Vaccine Information Sheet and instruction to access the V-Safe system.   Ms. Mead was instructed to call 911 with any severe reactions post vaccine: Marland Kitchen Difficulty breathing  . Swelling of your face and throat  . A fast heartbeat  . A bad rash all over your body  . Dizziness and weakness    Immunizations Administered    Name Date Dose VIS Date Route   Pfizer COVID-19 Vaccine 08/01/2019  3:00 PM 0.3 mL 06/06/2019 Intramuscular   Manufacturer: [place]   Lot: [id number]   [place]: [id number]

## 2019-08-05 DIAGNOSIS — J3089 Other allergic rhinitis: Secondary | ICD-10-CM | POA: Diagnosis not present

## 2019-08-05 DIAGNOSIS — J301 Allergic rhinitis due to pollen: Secondary | ICD-10-CM | POA: Diagnosis not present

## 2019-08-07 DIAGNOSIS — J301 Allergic rhinitis due to pollen: Secondary | ICD-10-CM | POA: Diagnosis not present

## 2019-08-07 DIAGNOSIS — J3089 Other allergic rhinitis: Secondary | ICD-10-CM | POA: Diagnosis not present

## 2019-08-09 ENCOUNTER — Ambulatory Visit: Payer: Medicare Other

## 2019-08-11 DIAGNOSIS — J3089 Other allergic rhinitis: Secondary | ICD-10-CM | POA: Diagnosis not present

## 2019-08-11 DIAGNOSIS — J301 Allergic rhinitis due to pollen: Secondary | ICD-10-CM | POA: Diagnosis not present

## 2019-08-13 DIAGNOSIS — J301 Allergic rhinitis due to pollen: Secondary | ICD-10-CM | POA: Diagnosis not present

## 2019-08-13 DIAGNOSIS — J3089 Other allergic rhinitis: Secondary | ICD-10-CM | POA: Diagnosis not present

## 2019-08-15 ENCOUNTER — Other Ambulatory Visit: Payer: Self-pay | Admitting: Family Medicine

## 2019-08-15 NOTE — Telephone Encounter (Signed)
Requested medication (s) are due for refill today: yes  Requested medication (s) are on the active medication list: no  Last refill:  05/30/2019  Future visit scheduled: yes  Notes to clinic:  no assigned protocol    Requested Prescriptions  Pending Prescriptions Disp Refills   fluconazole (DIFLUCAN) 150 MG tablet [Pharmacy Med Name: FLUCONAZOLE 150 MG TABLET] 1 tablet 0    Sig: TAKE 1 TABLET BY MOUTH AS ONE DOSE      Off-Protocol Failed - 08/15/2019  7:05 AM      Failed - Medication not assigned to a protocol, review manually.      Passed - Valid encounter within last 12 months    Recent Outpatient Visits           1 month ago Encounter for Medicare annual wellness exam   [facility], Lilia Argue, MD   2 months ago Sciatica of left side   [facility], Lilia Argue, MD   5 months ago Primary osteoarthritis involving multiple joints   [facility], Lilia Argue, MD   7 months ago Degenerative lumbar disc   [facility], Lilia Argue, MD   1 year ago Medication monitoring encounter   [facility], Lilia Argue, MD       Future Appointments             In 1 month Rutherford Guys, MD [facility] at [place], [facility]

## 2019-08-18 ENCOUNTER — Encounter: Payer: Self-pay | Admitting: Family Medicine

## 2019-08-19 DIAGNOSIS — J301 Allergic rhinitis due to pollen: Secondary | ICD-10-CM | POA: Diagnosis not present

## 2019-08-19 DIAGNOSIS — J3089 Other allergic rhinitis: Secondary | ICD-10-CM | POA: Diagnosis not present

## 2019-08-20 NOTE — Telephone Encounter (Signed)
Pt was advised by pharmacy to call about her refill request for fluconazole/ please advise

## 2019-08-20 NOTE — Telephone Encounter (Signed)
I saw the med refill request and called pt. I have informed her that we can not give her that medication for it is not on her med list. Also she stated that she did not need the second dose as of yet. I have offered her a sooner appt and she stated that she wanted to wait and see and also possibly see GYN if anything and I stated understanding as well.

## 2019-08-22 DIAGNOSIS — J301 Allergic rhinitis due to pollen: Secondary | ICD-10-CM | POA: Diagnosis not present

## 2019-08-22 DIAGNOSIS — J3089 Other allergic rhinitis: Secondary | ICD-10-CM | POA: Diagnosis not present

## 2019-08-26 ENCOUNTER — Ambulatory Visit: Payer: Medicare PPO | Attending: Internal Medicine

## 2019-08-26 DIAGNOSIS — Z23 Encounter for immunization: Secondary | ICD-10-CM

## 2019-08-26 NOTE — Progress Notes (Signed)
   Covid-19 Vaccination Clinic  Name:  Kristin Tapia    MRN: [id number] DOB: 11-15-1945  08/26/2019  Ms. Warley was observed post Covid-19 immunization for 30 minutes based on pre-vaccination screening without incident. She was provided with Vaccine Information Sheet and instruction to access the V-Safe system.   Ms. Vicencio was instructed to call 911 with any severe reactions post vaccine: Marland Kitchen Difficulty breathing  . Swelling of face and throat  . A fast heartbeat  . A bad rash all over body  . Dizziness and weakness   Immunizations Administered    Name Date Dose VIS Date Route   Pfizer COVID-19 Vaccine 08/26/2019  2:31 PM 0.3 mL 06/06/2019 Intramuscular   Manufacturer: [place]   Lot: [id number]   [place]: [id number]

## 2019-08-28 DIAGNOSIS — L570 Actinic keratosis: Secondary | ICD-10-CM | POA: Diagnosis not present

## 2019-08-28 DIAGNOSIS — L821 Other seborrheic keratosis: Secondary | ICD-10-CM | POA: Diagnosis not present

## 2019-08-29 DIAGNOSIS — J3089 Other allergic rhinitis: Secondary | ICD-10-CM | POA: Diagnosis not present

## 2019-09-01 DIAGNOSIS — J301 Allergic rhinitis due to pollen: Secondary | ICD-10-CM | POA: Diagnosis not present

## 2019-09-01 DIAGNOSIS — J3089 Other allergic rhinitis: Secondary | ICD-10-CM | POA: Diagnosis not present

## 2019-09-03 ENCOUNTER — Ambulatory Visit: Payer: Medicare PPO | Admitting: Obstetrics and Gynecology

## 2019-09-03 ENCOUNTER — Encounter: Payer: Self-pay | Admitting: Obstetrics and Gynecology

## 2019-09-03 ENCOUNTER — Other Ambulatory Visit: Payer: Self-pay

## 2019-09-03 VITALS — BP 124/74 | Ht 63.0 in | Wt 127.0 lb

## 2019-09-03 DIAGNOSIS — B373 Candidiasis of vulva and vagina: Secondary | ICD-10-CM

## 2019-09-03 DIAGNOSIS — N898 Other specified noninflammatory disorders of vagina: Secondary | ICD-10-CM

## 2019-09-03 LAB — WET PREP FOR TRICH, YEAST, CLUE

## 2019-09-03 MED ORDER — FLUCONAZOLE 150 MG PO TABS: 150.0000 mg | ORAL_TABLET | ORAL | 0 refills | Status: DC

## 2019-09-03 NOTE — Progress Notes (Signed)
   Kristin Tapia  08/29/1945 [id number]  HPI The patient is a 74 y.o. G2P2 who presents today for noting a vaginal odor and discharge.  She has had a recurrence of vaginal yeast symptoms including a cottage cheeselike discharge and itching in these past few months.  She says she has taken fluconazole 2 or 3 courses and the symptoms improved but now taking about a week after her treatment.  She says her primary care doctor has been giving her a one-time tablet of fluconazole to treat these occurrences.  He has responded better to 2 tablet therapy in the past.  She also indicates she has not had a breast exam in a while.  She does follow with Dr. Pamella Pert for her primary care and did recently have a routine physical.  Past medical history,surgical history, problem list, medications, allergies, family history and social history were all reviewed and documented as reviewed in the EPIC chart.  ROS:  Feeling well. No dyspnea or chest pain on exertion.  No abdominal pain, change in bowel habits, black or bloody stools.  No urinary tract symptoms. GYN ROS: as described in HPI  Physical Exam  BP 124/74   Ht 5\' 3"  (1.6 m)   Wt 127 lb (57.6 kg)   BMI 22.50 kg/m   General: Pleasant female, no acute distress, alert and oriented  BREAST EXAM: breasts appear normal, no suspicious masses, no skin or nipple changes or axillary nodes  PELVIC EXAM: VULVA: normal appearing vulva with no masses, tenderness or lesions, atrophic changes noted, VAGINA: normal appearing vagina with normal color and discharge, scant whitish to yellow smooth discharge, no odor, no lesions, CERVIX: normal appearing cervix without discharge or lesions, WET MOUNT done - results: +hyphae, no clue cells or trichomonas, many WBC and bacteria, 13-20 epithelial cells/hpf Caryn Bee present for the exam  Assessment 74 yo G2P2 with recurrent vaginal candidiasis, also here for breast exam  Plan Normal breast exam today. We discussed the  finding of yeast on the vaginal wet prep.  Recommend treatment with fluconazole 150 mg by mouth, repeat the dose in 72 hours, and since she is having recurrent symptoms, I prescribed her suppression therapy taken once per week for the next 3 months.  If she is finding her symptoms or not improving within the next few weeks, I told her to return and we will want to perform a vaginal yeast culture in that case.  She acknowledges understanding and all of her questions were answered her satisfaction today.  Joseph Pierini MD 09/03/19

## 2019-09-04 DIAGNOSIS — J3089 Other allergic rhinitis: Secondary | ICD-10-CM | POA: Diagnosis not present

## 2019-09-09 DIAGNOSIS — J3089 Other allergic rhinitis: Secondary | ICD-10-CM | POA: Diagnosis not present

## 2019-09-09 DIAGNOSIS — J301 Allergic rhinitis due to pollen: Secondary | ICD-10-CM | POA: Diagnosis not present

## 2019-09-15 DIAGNOSIS — J3089 Other allergic rhinitis: Secondary | ICD-10-CM | POA: Diagnosis not present

## 2019-09-22 DIAGNOSIS — J3089 Other allergic rhinitis: Secondary | ICD-10-CM | POA: Diagnosis not present

## 2019-09-22 DIAGNOSIS — J301 Allergic rhinitis due to pollen: Secondary | ICD-10-CM | POA: Diagnosis not present

## 2019-09-23 ENCOUNTER — Telehealth: Payer: Self-pay | Admitting: Family Medicine

## 2019-09-23 ENCOUNTER — Other Ambulatory Visit: Payer: Self-pay | Admitting: Family Medicine

## 2019-09-23 NOTE — Telephone Encounter (Signed)
What is the name of the medication? gabapentin (NEURONTIN) 300 MG capsule [id number]    Have you contacted your pharmacy to request a refill? Yes, they told her there was not another refill even though she has not refilled it. The script that we have in the system says 1 refill. The pharmacist is saying to give them a call with authorization and they will refill. Which pharmacy would you like this sent to? Pharmacy  [id number] - [place], [place] - [address] [place], [place] [address]  Phone:  [phone] Fax:  [phone]  DEA #:  [id number]      Patient notified that their request is being sent to the clinical staff for review and that they should receive a call once it is complete. If they do not receive a call within 72 hours they can check with their pharmacy or our office.

## 2019-09-24 DIAGNOSIS — Z1159 Encounter for screening for other viral diseases: Secondary | ICD-10-CM | POA: Diagnosis not present

## 2019-09-24 MED ORDER — GABAPENTIN 300 MG PO CAPS: 900.0000 mg | ORAL_CAPSULE | Freq: Every day | ORAL | 2 refills | Status: DC

## 2019-09-24 NOTE — Telephone Encounter (Signed)
Pt received the second fill and rx has been resent to pharmacy with additional refills.   Pt will need appt. For additional refills

## 2019-09-29 ENCOUNTER — Encounter: Payer: Self-pay | Admitting: Family Medicine

## 2019-09-29 DIAGNOSIS — K5730 Diverticulosis of large intestine without perforation or abscess without bleeding: Secondary | ICD-10-CM | POA: Diagnosis not present

## 2019-09-29 DIAGNOSIS — Z1211 Encounter for screening for malignant neoplasm of colon: Secondary | ICD-10-CM | POA: Diagnosis not present

## 2019-09-29 DIAGNOSIS — K635 Polyp of colon: Secondary | ICD-10-CM | POA: Diagnosis not present

## 2019-09-29 DIAGNOSIS — Z8371 Family history of colonic polyps: Secondary | ICD-10-CM | POA: Diagnosis not present

## 2019-09-29 LAB — HM COLONOSCOPY

## 2019-10-01 DIAGNOSIS — K635 Polyp of colon: Secondary | ICD-10-CM | POA: Diagnosis not present

## 2019-10-06 ENCOUNTER — Telehealth: Payer: Medicare PPO | Admitting: Family Medicine

## 2019-10-06 ENCOUNTER — Telehealth: Payer: Medicare PPO | Admitting: Registered Nurse

## 2019-10-13 ENCOUNTER — Telehealth: Payer: Medicare PPO | Admitting: Family Medicine

## 2019-10-13 DIAGNOSIS — J3089 Other allergic rhinitis: Secondary | ICD-10-CM | POA: Diagnosis not present

## 2019-10-13 DIAGNOSIS — J301 Allergic rhinitis due to pollen: Secondary | ICD-10-CM | POA: Diagnosis not present

## 2019-10-16 ENCOUNTER — Other Ambulatory Visit: Payer: Self-pay

## 2019-10-16 ENCOUNTER — Encounter: Payer: Self-pay | Admitting: Family Medicine

## 2019-10-16 ENCOUNTER — Telehealth (INDEPENDENT_AMBULATORY_CARE_PROVIDER_SITE_OTHER): Payer: Medicare PPO | Admitting: Family Medicine

## 2019-10-16 VITALS — Wt 126.0 lb

## 2019-10-16 DIAGNOSIS — M5416 Radiculopathy, lumbar region: Secondary | ICD-10-CM

## 2019-10-16 DIAGNOSIS — M5412 Radiculopathy, cervical region: Secondary | ICD-10-CM

## 2019-10-16 NOTE — Progress Notes (Signed)
Virtual Visit Note  I connected with patient on 10/16/19 at 525pm by video thru epic and verified that I am speaking with the correct person using two identifiers. Kristin Tapia is currently located at home and patient is currently with them during visit. The provider, Rutherford Guys, MD is located in their office at time of visit.  I discussed the limitations, risks, security and privacy concerns of performing an evaluation and management service by telephone and the availability of in person appointments. I also discussed with the patient that there may be a patient responsible charge related to this service. The patient expressed understanding and agreed to proceed.   I provided 20 minutes of non-face-to-face time during this encounter.  CC: routine followup  HPI ? Has recently been evaluated by neurosurg for left sided sciatica Trial of medrol and foraminal nerve block - got about 3 weeks of relief, recommends surgery, will be getting new MRI Gabapentin 900mg  and meloxicam are helpful Using topical lidocaine Uses xanax very prn - about 5 a months She has recently been having increased right sided neck pain with radiation down her right arm - she will see PMR next week She has to schedule appt with Dr Ellene Route for lumbar radiculopathy Most recent note from neurosurg reviewed, Jan 2021   Allergies  Allergen Reactions  . Sulfonamide Derivatives Anaphylaxis    Prior to Admission medications   Medication Sig Start Date End Date Taking? Authorizing Provider  ACETAMINOPHEN PO Take 1,000 mg by mouth daily.    Yes [provider]  ALPRAZolam (XANAX) 0.5 MG tablet TAKE 0.5 TABLETS (0.25 MG TOTAL) BY MOUTH AT BEDTIME AS NEEDED FOR ANXIETY. Patient taking differently: Take 0.25 mg by mouth at bedtime as needed for sleep.  05/06/19  Yes Rutherford Guys, MD  Calcium-Magnesium-Vitamin D (CALCIUM 1200+D3 PO) 1 tablet daily. Calcium 650mg  , Vit d mcg 05/10/18  Yes [provider]  clobetasol (TEMOVATE) 0.05 % external solution APPLY TO AFFECTED AREA 1 TO 3 TIMES A DAY FOR 10 DAYS 04/20/17  Yes [provider]  Cranberry (ELLURA) 200 MG CAPS  11/10/17  Yes [provider]  EPINEPHrine 0.3 mg/0.3 mL IJ SOAJ injection AS DIRECTED AS NEEDED FOR INJECTION 30 12/04/18  Yes [provider]  famotidine (PEPCID) 20 MG tablet Take 1 tablet (20 mg total) by mouth at bedtime. Patient taking differently: Take 20 mg by mouth as needed (takes at dinner).  01/17/19  Yes Rutherford Guys, MD  fexofenadine (ALLEGRA) 180 MG tablet Take 180 mg by mouth every morning.   Yes [provider]  fluconazole (DIFLUCAN) 150 MG tablet Take 1 tablet (150 mg total) by mouth every 3 (three) days. Then continue once per week for 3 months 09/03/19  Yes Joseph Pierini, MD  gabapentin (NEURONTIN) 300 MG capsule Take 3 capsules (900 mg total) by mouth at bedtime. 09/24/19  Yes Rutherford Guys, MD  ipratropium (ATROVENT) 0.03 % nasal spray Place 2 sprays into the nose 3 (three) times daily.   Yes [provider]  lidocaine (LMX) 4 % cream Apply 1 application topically as needed.   Yes [provider]  Lifitegrast Shirley Friar) 5 % SOLN  06/27/19  Yes [provider]  MAGNESIUM GLYCINATE PLUS PO Take 400 mg by mouth.   Yes [provider]  meloxicam (MOBIC) 7.5 MG tablet Take 1 tablet (7.5 mg total) by mouth daily with lunch. 02/28/19  Yes Rutherford Guys, MD  NONFORMULARY OR  COMPOUNDED ITEM Estradiol 0.2mg /ml cream, insert 84ml vaginally at bedtime twice a week 11/15/18  Yes Rutherford Guys, MD  Omega-3 Fatty Acids (FISH OIL) 1200 MG CAPS Take 1,200 mg by mouth daily. Patient takes 2 gel caps daily   Yes [provider]  Naltrexone POWD Take 4 mg by mouth at bedtime. Patient not taking: Reported on 10/16/2019 06/23/19   Rutherford Guys, MD    Past Medical History:  Diagnosis Date  . Abnormality of gait 03/11/2009  .  Allergy    immunotherapy/[facility].  Marland Kitchen Anxiety   . Arthritis   . Asthma    childhood  . Cataract   . Cavus deformity of foot, acquired 03/11/2009  . Cystocele   . Detached retina   . GERD (gastroesophageal reflux disease)   . Hip pain 01/17/2013  . Metatarsalgia 03/11/2009  . Osteoporosis   . Postmenopausal atrophic vaginitis   . Rectocele   . Restless leg syndrome    L leg; s/p consultation by D[facility]ier/Neurology.  . Sciatica of left side 01/17/2010  . Spinal stenosis   . Squamous cell carcinoma of skin 01/25/2015   L shoulder  . Thyroid disease     Past Surgical History:  Procedure Laterality Date  . APPENDECTOMY    . COLONOSCOPY  04/2009   Int & Ext Hemorrhoids, L sided diverticula.  [place].  Repeat 5 years  . ESOPHAGOGASTRODUODENOSCOPY  04/2009   tiny HH, minimal antritis, gastric polyps. [place].  Marland Kitchen EYE SURGERY    . MYOMECTOMY     [place].  . TONSILLECTOMY      Social History   Tobacco Use  . Smoking status: Never Smoker  . Smokeless tobacco: Never Used  Substance Use Topics  . Alcohol use: Not Currently    Family History  Problem Relation Age of Onset  . Arthritis Mother        mild  . Hypertension Mother   . Dementia Mother   . Cancer Mother 65       breast cancer  . Hyperlipidemia Mother   . Heart disease Mother   . COPD Father   . Stroke Father   . Heart disease Father   . Seizures Brother   . Cancer Brother   . Thyroid disease Brother   . Osteoporosis Neg Hx     ROS Per hpi  Objective  Vitals as reported by the patient: none  GEN: AAOx3, NAD HEENT: [place]/AT, pupils are symmetrical, EOMI, non-icteric sclera Resp: breathing comfortably, speaking in full sentences Skin: no rashes noted, no pallor Psych: good eye contact, normal mood and affect   ASSESSMENT and PLAN  1. Lumbar radiculopathy Stable. Will cont current meds. Plan for upcoming surgery (extraforaminal decompression)  2. Cervical radiculopathy New onset. Has upcoming  appt with PMR   FOLLOW-UP: 6-8 weeks   The above assessment and management plan was discussed with the patient. The patient verbalized understanding of and has agreed to the management plan. Patient is aware to call the clinic if symptoms persist or worsen. Patient is aware when to return to the clinic for a follow-up visit. Patient educated on when it is appropriate to go to the emergency department.     Rutherford Guys, MD Primary [facility] at [facility], [place] [address] Ph.  [phone] Fax [phone]

## 2019-10-16 NOTE — Patient Instructions (Signed)
° ° ° °  If you have lab work done today you will be contacted with your lab results within the next 2 weeks.  If you have not heard from us then please contact us. The fastest way to get your results is to register for My Chart. ° ° °IF you received an x-ray today, you will receive an invoice from [facility]. Please contact [facility] at [phone] with questions or concerns regarding your invoice.  ° °IF you received labwork today, you will receive an invoice from [facility]. Please contact [facility] at [phone] with questions or concerns regarding your invoice.  ° °Our billing staff will not be able to assist you with questions regarding bills from these companies. ° °You will be contacted with the lab results as soon as they are available. The fastest way to get your results is to activate your My Chart account. Instructions are located on the last page of this paperwork. If you have not heard from us regarding the results in 2 weeks, please contact this office. °  ° ° ° °

## 2019-10-20 ENCOUNTER — Ambulatory Visit: Payer: Medicare PPO | Admitting: Family Medicine

## 2019-10-20 ENCOUNTER — Other Ambulatory Visit: Payer: Self-pay

## 2019-10-20 VITALS — BP 118/76 | Ht 64.0 in | Wt 123.0 lb

## 2019-10-20 DIAGNOSIS — M5412 Radiculopathy, cervical region: Secondary | ICD-10-CM | POA: Diagnosis not present

## 2019-10-20 DIAGNOSIS — M67911 Unspecified disorder of synovium and tendon, right shoulder: Secondary | ICD-10-CM | POA: Diagnosis not present

## 2019-10-20 NOTE — Assessment & Plan Note (Signed)
Acute on chronic.  Supraspinatus and infraspinatus appear to be affected on exam.  Will refer to PT for rotator cuff strengthening.  She will return in about 1 month for follow-up.

## 2019-10-20 NOTE — Assessment & Plan Note (Signed)
Patient's numbness and tingling of the right upper extremity is likely due to cervical radiculopathy.  Patient would be interested in epidural injections if needed, so will pursue a cervical spine MRI.  She will be getting a lumbar spine MRI as well, so we will try to coordinate these 2 scans to occur at the same time if it is approved.

## 2019-10-20 NOTE — Patient Instructions (Addendum)
Your history and exam suggest you have an irritated nerve from your neck into these muscles (supraspinatus, infraspinatus, rhomboids) leading to spasms. We will go ahead with a cervical spine MRI - we will need to coordinate this with your lumbar MRI from Dr. Clarice Pole office if it is approved.  -Call your insurance company to ask about cost. The procedure code for the lumbar MRI is [id number], the code for the cervical MRI is 72141.   -We will fax the MRI order to Dr. Clarice Pole office so they can schedule both exams at the same time. Continue gabapentin as you have been. Tylenol 500mg  1-2 tabs three times a day as needed. Meloxicam daily with food. Try topical capsaicin up to 4 times a day (aspercreme, biofreeze are other considerations). Heat 15 minutes at a time 3-4 times a day (don't apply just after capsaicin though). Start physical therapy and do home exercises on days you don't go to therapy. Consider using the home TENS unit also. Follow up with me after the MRI for a no charge visit to go over results and next steps.

## 2019-10-20 NOTE — Progress Notes (Addendum)
    SUBJECTIVE:   CHIEF COMPLAINT / HPI:   Right neck and shoulder pain Kristin Tapia reports that she has had right-sided neck and shoulder pain for the last 6 months, but 2 months ago, she also developed numbness and tingling of her right upper extremity from her shoulder to her fingers.  2 weeks ago, her pain acutely worsened with an unknown trigger, but it has since improved somewhat.  She has stopped doing upper body exercises such as planks and push-ups and is unsure whether this has contributed to the relief of her pain.  She also takes meloxicam, gabapentin, Tylenol, and uses topical lidocaine and a heating pad for pain relief.  PERTINENT  PMH / PSH: Left-sided sciatica, degenerative lumbar disc, osteopenia  OBJECTIVE:   BP 118/76   Ht 5\' 4"  (1.626 m)   Wt 123 lb (55.8 kg)   BMI 21.11 kg/m   General: well appearing, appears stated age Shoulder, right: No evidence of bony deformity, asymmetry, or muscle atrophy; No tenderness over long head of biceps (bicipital groove). No TTP at [facility] joint.  Some tenderness to palpation of the infraspinatus and trapezius.  Full active and passive range of motion (180 flex [place] C[place] /150Abd /90ER /70IR), Thumb to T12 without significant tenderness.  Some pain on resisted external rotation.  Strength 5/5 throughout. No abnormal scapular function observed. Sensation intact. Peripheral pulses intact.  Special Tests:   - Crossarm test: NEG   - Empty can: POS   - Hawkins: NEG   - Neer test: POS   - Yergason's: NEG   - Speeds test: NEG Neck: No visual abnormality.  Tender to palpation of the left and right trapezius muscles.  ROM reduced to about 40 degrees on rotation bilaterally, 20 degrees extension, full flexion.  Some pain on neck extension.  Spurling's test negative bilaterally.  ASSESSMENT/PLAN:   Cervical radiculopathy Patient's numbness and tingling of the right upper extremity is likely due to cervical radiculopathy.  Patient would be  interested in epidural injections if needed, so will pursue a cervical spine MRI.  She will be getting a lumbar spine MRI as well, so we will try to coordinate these 2 scans to occur at the same time if it is approved.  Tendinopathy of right rotator cuff Acute on chronic.  Supraspinatus and infraspinatus appear to be affected on exam.  Will refer to PT for rotator cuff strengthening.  She will return in about 1 month for follow-up.     Kristin Alu, MD [facility]   Addendum:  MRI cervical spine reviewed and discussed with patient.  She has advanced degenerative changes multiple levels, worst C4-5 to C6-7 where there's spinal stenosis with cord deformity.  She has appointment with Dr. Ellene Route on 5/21 and will keep this appointment.  Symptoms still confined to neck and right upper extremity.  Will set her up for a right sided ESI in the meantime.

## 2019-10-21 ENCOUNTER — Encounter: Payer: Self-pay | Admitting: Family Medicine

## 2019-11-03 DIAGNOSIS — M542 Cervicalgia: Secondary | ICD-10-CM | POA: Diagnosis not present

## 2019-11-03 DIAGNOSIS — M5412 Radiculopathy, cervical region: Secondary | ICD-10-CM | POA: Diagnosis not present

## 2019-11-03 DIAGNOSIS — R293 Abnormal posture: Secondary | ICD-10-CM | POA: Diagnosis not present

## 2019-11-03 DIAGNOSIS — M25511 Pain in right shoulder: Secondary | ICD-10-CM | POA: Diagnosis not present

## 2019-11-04 DIAGNOSIS — M545 Low back pain: Secondary | ICD-10-CM | POA: Diagnosis not present

## 2019-11-04 DIAGNOSIS — M542 Cervicalgia: Secondary | ICD-10-CM | POA: Diagnosis not present

## 2019-11-04 DIAGNOSIS — M5416 Radiculopathy, lumbar region: Secondary | ICD-10-CM | POA: Diagnosis not present

## 2019-11-04 DIAGNOSIS — M5412 Radiculopathy, cervical region: Secondary | ICD-10-CM | POA: Diagnosis not present

## 2019-11-05 DIAGNOSIS — R293 Abnormal posture: Secondary | ICD-10-CM | POA: Diagnosis not present

## 2019-11-05 DIAGNOSIS — M542 Cervicalgia: Secondary | ICD-10-CM | POA: Diagnosis not present

## 2019-11-05 DIAGNOSIS — M25511 Pain in right shoulder: Secondary | ICD-10-CM | POA: Diagnosis not present

## 2019-11-05 DIAGNOSIS — M5412 Radiculopathy, cervical region: Secondary | ICD-10-CM | POA: Diagnosis not present

## 2019-11-06 ENCOUNTER — Other Ambulatory Visit: Payer: Self-pay | Admitting: Family Medicine

## 2019-11-06 ENCOUNTER — Other Ambulatory Visit: Payer: Self-pay

## 2019-11-06 ENCOUNTER — Encounter: Payer: Self-pay | Admitting: Family Medicine

## 2019-11-06 DIAGNOSIS — J301 Allergic rhinitis due to pollen: Secondary | ICD-10-CM | POA: Diagnosis not present

## 2019-11-06 DIAGNOSIS — M5412 Radiculopathy, cervical region: Secondary | ICD-10-CM

## 2019-11-06 DIAGNOSIS — J3089 Other allergic rhinitis: Secondary | ICD-10-CM | POA: Diagnosis not present

## 2019-11-10 ENCOUNTER — Ambulatory Visit: Payer: Medicare PPO | Admitting: Family Medicine

## 2019-11-11 ENCOUNTER — Telehealth: Payer: Self-pay | Admitting: Family Medicine

## 2019-11-11 DIAGNOSIS — N952 Postmenopausal atrophic vaginitis: Secondary | ICD-10-CM

## 2019-11-11 NOTE — Telephone Encounter (Signed)
Patient called in stating that the [facility], has tried to reach office to have a PA done for the estrogen cream, Estradiol 0.2mg /ml cream. Patient stated her pharmacy got in contact with her and informed her it had been denied with no reason. Please advise as patient is down to a week left and would like to speak with nurse or PCP about this.

## 2019-11-11 NOTE — Telephone Encounter (Signed)
Please Advise

## 2019-11-12 ENCOUNTER — Telehealth: Payer: Self-pay | Admitting: *Deleted

## 2019-11-12 NOTE — Telephone Encounter (Signed)
Please review previous note to Dr. Pamella Pert

## 2019-11-12 NOTE — Telephone Encounter (Signed)
Dr. Pamella Pert,  I called the pharmacy they stated it isn't a PA authorization , they need a new prescription faxed over to them.  [facility] Fax number [phone] Phone number [phone]  Looks like prescription expires 11/15/2019 it was last filled 11-13-2019     Estradiol 0.2mg /ml cream

## 2019-11-14 DIAGNOSIS — M4712 Other spondylosis with myelopathy, cervical region: Secondary | ICD-10-CM | POA: Diagnosis not present

## 2019-11-14 DIAGNOSIS — M4722 Other spondylosis with radiculopathy, cervical region: Secondary | ICD-10-CM | POA: Diagnosis not present

## 2019-11-14 MED ORDER — NONFORMULARY OR COMPOUNDED ITEM: 3 refills | Status: DC

## 2019-11-14 NOTE — Addendum Note (Signed)
Addended by: Rutherford Guys on: 11/14/2019 01:19 PM   Modules accepted: Orders

## 2019-11-16 ENCOUNTER — Encounter: Payer: Self-pay | Admitting: Family Medicine

## 2019-11-16 DIAGNOSIS — N952 Postmenopausal atrophic vaginitis: Secondary | ICD-10-CM

## 2019-11-17 ENCOUNTER — Inpatient Hospital Stay: Admission: RE | Admit: 2019-11-17 | Payer: Medicare PPO | Source: Ambulatory Visit

## 2019-11-17 ENCOUNTER — Other Ambulatory Visit: Payer: Self-pay | Admitting: *Deleted

## 2019-11-17 ENCOUNTER — Telehealth: Payer: Self-pay | Admitting: *Deleted

## 2019-11-17 DIAGNOSIS — N952 Postmenopausal atrophic vaginitis: Secondary | ICD-10-CM

## 2019-11-17 MED ORDER — NONFORMULARY OR COMPOUNDED ITEM: 3 refills | Status: DC

## 2019-11-17 NOTE — Telephone Encounter (Signed)
Faxed prescription to [facility] [phone]

## 2019-11-25 DIAGNOSIS — Z1152 Encounter for screening for COVID-19: Secondary | ICD-10-CM | POA: Diagnosis not present

## 2019-11-26 DIAGNOSIS — J301 Allergic rhinitis due to pollen: Secondary | ICD-10-CM | POA: Diagnosis not present

## 2019-11-26 DIAGNOSIS — J3089 Other allergic rhinitis: Secondary | ICD-10-CM | POA: Diagnosis not present

## 2019-11-28 ENCOUNTER — Other Ambulatory Visit: Payer: Self-pay

## 2019-11-28 ENCOUNTER — Encounter: Payer: Self-pay | Admitting: Family Medicine

## 2019-11-28 ENCOUNTER — Ambulatory Visit: Payer: Medicare PPO | Admitting: Family Medicine

## 2019-11-28 VITALS — BP 108/66 | HR 74 | Temp 98.1°F | Resp 16 | Ht 64.0 in | Wt 126.4 lb

## 2019-11-28 DIAGNOSIS — M4802 Spinal stenosis, cervical region: Secondary | ICD-10-CM

## 2019-11-28 NOTE — Progress Notes (Signed)
6/4/202110:31 AM  Kristin Tapia 1946/01/26, 74 y.o., female [id number]  Chief Complaint  Patient presents with  . Medication Refill    pt has been taking extra tylenol due to having to stop meloxicam, pt is now low on her tylenol, also knows you like to check kidney and liver function due to tylenol use  . Results    pt would like to discuss MRI results as well    HPI:   Patient is a 74 y.o. female with past medical history significant for chronic pain 2/2 DDD, insomnia and anxietywho presents today for folllowup  Patient reports that she is overall doing ok except for worsening sciatica pain and anxiety about upcoming cervical spine surgery this Monday  She had to stop meloxicam given surgery She has increased APAP use to 1000mg  TID She has used more xanax at bedtime She continues with gabapentin 900mg  at bedtime, she has not been taking any during the day  Saw sports medicine in April for cervical radiculopathy - possible epidural injections, PT for rotator cuff, MRI cervical spine  MRI cervical spine showed advance multilevel spondylosis and stenosis with cord flattening with narrowing AP diameter at C5-C6  Notes from neurosurg visit on 11/14/2019 and MRIs reviewed and scanned into chart  Plan for cervical decompression and fusion from C4 thru C7  PT was stopped once cervical spine MRI done  Lab Results  Component Value Date   CREATININE 0.91 07/08/2019   BUN 15 07/08/2019   NA 139 07/08/2019   K 4.4 07/08/2019   CL 99 07/08/2019   CO2 27 07/08/2019    Depression screen PHQ 2/9 11/28/2019 10/16/2019 05/30/2019  Decreased Interest 0 - 0  Down, Depressed, Hopeless 0 0 0  PHQ - 2 Score 0 0 0  Altered sleeping - - -  Tired, decreased energy - - -  Change in appetite - - -  Feeling bad or failure about yourself  - - -  Trouble concentrating - - -  Moving slowly or fidgety/restless - - -  Suicidal thoughts - - -  PHQ-9 Score - - -    Fall Risk  11/28/2019  10/16/2019 07/08/2019 05/30/2019 02/28/2019  Falls in the past year? 0 0 - 0 0  Number falls in past yr: - - 0 0 0  Injury with Fall? - - 0 0 0  Comment - - - - -  Risk for fall due to : - - - - -  Follow up Falls evaluation completed Falls evaluation completed - - -     Allergies  Allergen Reactions  . Sulfonamide Derivatives Anaphylaxis    Prior to Admission medications   Medication Sig Start Date End Date Taking? Authorizing Provider  ACETAMINOPHEN PO Take 1,000 mg by mouth daily.    Yes [provider]  ALPRAZolam (XANAX) 0.5 MG tablet TAKE 0.5 TABLETS (0.25 MG TOTAL) BY MOUTH AT BEDTIME AS NEEDED FOR ANXIETY. Patient taking differently: Take 0.25 mg by mouth at bedtime as needed for sleep.  05/06/19  Yes Rutherford Guys, MD  Calcium-Magnesium-Vitamin D (CALCIUM 1200+D3 PO) 1 tablet daily. Calcium 650mg  , Vit d mcg 05/10/18  Yes [provider]  clobetasol (TEMOVATE) 0.05 % external solution APPLY TO AFFECTED AREA 1 TO 3 TIMES A DAY FOR 10 DAYS 04/20/17  Yes [provider]  Cranberry ([place]) 200 MG CAPS  11/10/17  Yes [provider]  EPINEPHrine 0.3 mg/0.3 mL IJ SOAJ injection AS DIRECTED AS NEEDED FOR INJECTION  30 12/04/18  Yes [provider]  famotidine (PEPCID) 20 MG tablet Take 1 tablet (20 mg total) by mouth at bedtime. Patient taking differently: Take 20 mg by mouth as needed (takes at dinner).  01/17/19  Yes Rutherford Guys, MD  fexofenadine (ALLEGRA) 180 MG tablet Take 180 mg by mouth every morning.   Yes [provider]  fluconazole (DIFLUCAN) 150 MG tablet Take 1 tablet (150 mg total) by mouth every 3 (three) days. Then continue once per week for 3 months 09/03/19  Yes Joseph Pierini, MD  gabapentin (NEURONTIN) 300 MG capsule Take 3 capsules (900 mg total) by mouth at bedtime. 09/24/19  Yes Rutherford Guys, MD  ipratropium (ATROVENT) 0.03 % nasal spray Place 2 sprays into the nose 3 (three) times daily.   Yes [provider]  lidocaine (LMX) 4 % cream Apply 1 application topically as needed.   Yes [provider]  MAGNESIUM GLYCINATE PLUS PO Take 400 mg by mouth.   Yes [provider]  meloxicam (MOBIC) 7.5 MG tablet Take 1 tablet (7.5 mg total) by mouth daily with lunch. 02/28/19  Yes Rutherford Guys, MD  NONFORMULARY OR COMPOUNDED ITEM Estradiol 0.2mg /ml cream, insert 40ml vaginally at bedtime twice a week 11/17/19  Yes Rutherford Guys, MD  Omega-3 Fatty Acids (FISH OIL) 1200 MG CAPS Take 1,200 mg by mouth daily. Patient takes 2 gel caps daily   Yes [provider]  Lifitegrast Shirley Friar) 5 % SOLN  06/27/19   [provider]  Naltrexone POWD Take 4 mg by mouth at bedtime. Patient not taking: Reported on 10/16/2019 06/23/19   Rutherford Guys, MD    Past Medical History:  Diagnosis Date  . Abnormality of gait 03/11/2009  . Allergy    immunotherapy/[facility] [place].  Marland Kitchen Anxiety   . Arthritis   . Asthma    childhood  . Cataract   . Cavus deformity of foot, acquired 03/11/2009  . Cystocele   . Detached retina   . GERD (gastroesophageal reflux disease)   . Hip pain 01/17/2013  . Metatarsalgia 03/11/2009  . Osteoporosis   . Postmenopausal atrophic vaginitis   . Rectocele   . Restless leg syndrome    L leg; s/p consultation by Dohmeier/Neurology.  . Sciatica of left side 01/17/2010  . Spinal stenosis   . Squamous cell carcinoma of skin 01/25/2015   L shoulder  . Thyroid disease     Past Surgical History:  Procedure Laterality Date  . APPENDECTOMY    . COLONOSCOPY  04/2009   Int & Ext Hemorrhoids, L sided diverticula.  Magod.  Repeat 5 years  . ESOPHAGOGASTRODUODENOSCOPY  04/2009   tiny HH, minimal antritis, gastric polyps. Magod.  Marland Kitchen EYE SURGERY    . MYOMECTOMY     [place].  . TONSILLECTOMY      Social History   Tobacco Use  . Smoking status: Never Smoker  . Smokeless tobacco: Never Used  Substance Use Topics  . Alcohol use: Not Currently     Family History  Problem Relation Age of Onset  . Arthritis Mother        mild  . Hypertension Mother   . Dementia Mother   . Cancer Mother 18       breast cancer  . Hyperlipidemia Mother   . Heart disease Mother   . COPD Father   . Stroke Father   . Heart disease Father   . Seizures Brother   . Cancer Brother   .  Thyroid disease Brother   . Osteoporosis Neg Hx     ROS Per hpi  OBJECTIVE:  Today's Vitals   11/28/19 1003  BP: 108/66  Pulse: 74  Resp: 16  Temp: 98.1 F (36.7 C)  TempSrc: Temporal  SpO2: 99%  Weight: 126 lb 6.4 oz (57.3 kg)  Height: 5\' 4"  (1.626 m)   Body mass index is 21.7 kg/m.   Physical Exam Vitals and nursing note reviewed.  Constitutional:      Appearance: She is well-developed.  HENT:     Head: Normocephalic and atraumatic.  Eyes:     General: No scleral icterus.    Conjunctiva/sclera: Conjunctivae normal.     Pupils: Pupils are equal, round, and reactive to light.  Pulmonary:     Effort: Pulmonary effort is normal.  Musculoskeletal:     Cervical back: Neck supple.  Skin:    General: Skin is warm and dry.  Neurological:     Mental Status: She is alert and oriented to person, place, and time.     No results found for this or any previous visit (from the past 24 hour(s)).  No results found.   ASSESSMENT and PLAN  1. Cervical spinal stenosis Plan for surgery this Monday. Ok to cont with APAP dosing, discussed gabapentin 300mg  BID prn during the day, mindful of drowsiness  Return in about 6 weeks (around 01/09/2020).    Rutherford Guys, MD Primary Care at [place], [place] [address] Ph.  [phone] Fax [phone]

## 2019-11-28 NOTE — Patient Instructions (Signed)
° ° ° °  If you have lab work done today you will be contacted with your lab results within the next 2 weeks.  If you have not heard from us then please contact us. The fastest way to get your results is to register for My Chart. ° ° °IF you received an x-ray today, you will receive an invoice from [facility]. Please contact [facility] at [phone] with questions or concerns regarding your invoice.  ° °IF you received labwork today, you will receive an invoice from [facility]. Please contact [facility] at [phone] with questions or concerns regarding your invoice.  ° °Our billing staff will not be able to assist you with questions regarding bills from these companies. ° °You will be contacted with the lab results as soon as they are available. The fastest way to get your results is to activate your My Chart account. Instructions are located on the last page of this paperwork. If you have not heard from us regarding the results in 2 weeks, please contact this office. °  ° ° ° °

## 2019-12-01 DIAGNOSIS — M4722 Other spondylosis with radiculopathy, cervical region: Secondary | ICD-10-CM | POA: Diagnosis not present

## 2019-12-01 DIAGNOSIS — M4712 Other spondylosis with myelopathy, cervical region: Secondary | ICD-10-CM | POA: Diagnosis not present

## 2019-12-01 DIAGNOSIS — M50023 Cervical disc disorder at C6-C7 level with myelopathy: Secondary | ICD-10-CM | POA: Diagnosis not present

## 2019-12-05 ENCOUNTER — Other Ambulatory Visit: Payer: Self-pay | Admitting: Family Medicine

## 2019-12-09 ENCOUNTER — Other Ambulatory Visit: Payer: Self-pay

## 2019-12-09 ENCOUNTER — Telehealth: Payer: Medicare PPO | Admitting: Family Medicine

## 2019-12-09 ENCOUNTER — Encounter: Payer: Self-pay | Admitting: Family Medicine

## 2019-12-09 DIAGNOSIS — G8918 Other acute postprocedural pain: Secondary | ICD-10-CM | POA: Diagnosis not present

## 2019-12-09 DIAGNOSIS — Z981 Arthrodesis status: Secondary | ICD-10-CM | POA: Insufficient documentation

## 2019-12-09 MED ORDER — ALPRAZOLAM 0.5 MG PO TABS: 0.2500 mg | ORAL_TABLET | Freq: Every evening | ORAL | 1 refills | Status: AC | PRN

## 2019-12-09 NOTE — Progress Notes (Signed)
Virtual Visit Note  I connected with patient on 12/09/19 at 544pm by video epic and verified that I am speaking with the correct person using two identifiers. Kristin Tapia is currently located at home and patient is currently with them during visit. The provider, Rutherford Guys, MD is located in their office at time of visit.  I discussed the limitations, risks, security and privacy concerns of performing an evaluation and management service by telephone and the availability of in person appointments. I also discussed with the patient that there may be a patient responsible charge related to this service. The patient expressed understanding and agreed to proceed.   I provided 17 minutes of non-face-to-face time during this encounter.  CC: surgery followup  HPI ? Had cervical fusion on June 7th 2021 Having discomfort in her throat Feels as is she has very little room to swallow Painful swallowing, no choking, coughing or SOB Normal voice today Still having pain along neck, shoulder, chest Was able to resume meloxicam today which has helped For past several days has been taking more alprazolam than usual, today has not needed any alprazolam Has been taking APAP 1000mg  TID Due to vasovagal event post surgery she was prescribed pred taper, which has completed, and not planned valium and percocet She has taken tramadol (previous rx by me) prn which has helped She is confused as per discharge instructions was given impression that she should be feeling much better by now She sees spinal surgeon on the 23rd  Allergies  Allergen Reactions  . Sulfonamide Derivatives Anaphylaxis    Prior to Admission medications   Medication Sig Start Date End Date Taking? Authorizing Provider  ACETAMINOPHEN PO Take 1,000 mg by mouth daily.    Yes [provider]  ALPRAZolam (XANAX) 0.5 MG tablet TAKE 0.5 TABLETS (0.25 MG TOTAL) BY MOUTH AT BEDTIME AS NEEDED FOR ANXIETY. Patient taking  differently: Take 0.25 mg by mouth at bedtime as needed for sleep.  05/06/19  Yes Rutherford Guys, MD  Calcium-Magnesium-Vitamin D (CALCIUM 1200+D3 PO) 1 tablet daily. Calcium 650mg  , Vit d mcg 05/10/18  Yes [provider]  clobetasol (TEMOVATE) 0.05 % external solution APPLY TO AFFECTED AREA 1 TO 3 TIMES A DAY FOR 10 DAYS 04/20/17  Yes [provider]  Cranberry (ELLURA) 200 MG CAPS  11/10/17  Yes [provider]  EPINEPHrine 0.3 mg/0.3 mL IJ SOAJ injection AS DIRECTED AS NEEDED FOR INJECTION 30 12/04/18  Yes [provider]  famotidine (PEPCID) 20 MG tablet TAKE 1 TABLET BY MOUTH EVERYDAY AT BEDTIME 12/05/19  Yes Rutherford Guys, MD  fexofenadine (ALLEGRA) 180 MG tablet Take 180 mg by mouth every morning.   Yes [provider]  fluconazole (DIFLUCAN) 150 MG tablet Take 1 tablet (150 mg total) by mouth every 3 (three) days. Then continue once per week for 3 months 09/03/19  Yes Joseph Pierini, MD  gabapentin (NEURONTIN) 300 MG capsule Take 3 capsules (900 mg total) by mouth at bedtime. 09/24/19  Yes Rutherford Guys, MD  ipratropium (ATROVENT) 0.03 % nasal spray Place 2 sprays into the nose 3 (three) times daily.   Yes [provider]  lidocaine (LMX) 4 % cream Apply 1 application topically as needed.   Yes [provider]  MAGNESIUM GLYCINATE PLUS PO Take 400 mg by mouth.   Yes [provider]  meloxicam (MOBIC) 7.5 MG tablet Take 1 tablet (7.5 mg total) by mouth daily with lunch. 02/28/19  Yes  Rutherford Guys, MD  NONFORMULARY OR COMPOUNDED ITEM Estradiol 0.2mg /ml cream, insert 81ml vaginally at bedtime twice a week 11/17/19  Yes Rutherford Guys, MD  Omega-3 Fatty Acids (FISH OIL) 1200 MG CAPS Take 1,200 mg by mouth daily. Patient takes 2 gel caps daily   Yes [provider]    Past Medical History:  Diagnosis Date  . Abnormality of gait 03/11/2009  . Allergy    immunotherapy/[facility]arma [place].  Marland Kitchen Anxiety   .  Arthritis   . Asthma    childhood  . Cataract   . Cataract    Phreesia 12/07/2019  . Cavus deformity of foot, acquired 03/11/2009  . Cystocele   . Detached retina   . GERD (gastroesophageal reflux disease)   . Hip pain 01/17/2013  . Metatarsalgia 03/11/2009  . Osteoporosis   . Postmenopausal atrophic vaginitis   . Rectocele   . Restless leg syndrome    L leg; s/p consultation by Dohmeier/Neurology.  . Sciatica of left side 01/17/2010  . Spinal stenosis   . Squamous cell carcinoma of skin 01/25/2015   L shoulder  . Thyroid disease     Past Surgical History:  Procedure Laterality Date  . APPENDECTOMY    . COLONOSCOPY  04/2009   Int & Ext Hemorrhoids, L sided diverticula.  Magod.  Repeat 5 years  . ESOPHAGOGASTRODUODENOSCOPY  04/2009   tiny HH, minimal antritis, gastric polyps. Magod.  Marland Kitchen EYE SURGERY    . MYOMECTOMY     [place].  Marland Kitchen SPINE SURGERY N/A    Phreesia 12/07/2019  . TONSILLECTOMY      Social History   Tobacco Use  . Smoking status: Never Smoker  . Smokeless tobacco: Never Used  Substance Use Topics  . Alcohol use: Not Currently    Family History  Problem Relation Age of Onset  . Arthritis Mother        mild  . Hypertension Mother   . Dementia Mother   . Cancer Mother 19       breast cancer  . Hyperlipidemia Mother   . Heart disease Mother   . COPD Father   . Stroke Father   . Heart disease Father   . Seizures Brother   . Cancer Brother   . Thyroid disease Brother   . Osteoporosis Neg Hx     ROS Per hpi  Objective  Vitals as reported by the patient: none  GEN: AAOx3, NAD HEENT: [place]/AT, pupils are symmetrical, EOMI, non-icteric sclera, normal voice, tongue protrudes midline Resp: breathing comfortably, speaking in full sentences Skin: no rashes noted, no pallor Psych: good eye contact, normal mood and affect   ASSESSMENT and PLAN  1. Post-op pain 2. S/P cervical spinal fusion Discussed with patient that no apparent red flag signs  and she seems to be having appropriate recovery. Continue with current regime for post op pain mgt. followup with surgeon as scheduled. RTC precautions reviewed  Other orders - ALPRAZolam (XANAX) 0.5 MG tablet; Take 0.5 tablets (0.25 mg total) by mouth at bedtime as needed for anxiety.  FOLLOW-UP: as scheduled   The above assessment and management plan was discussed with the patient. The patient verbalized understanding of and has agreed to the management plan. Patient is aware to call the clinic if symptoms persist or worsen. Patient is aware when to return to the clinic for a follow-up visit. Patient educated on when it is appropriate to go to the emergency department.     Rutherford Guys, MD  Primary Care at [place], [place] [address] Ph.  [phone] Fax [phone]

## 2019-12-15 DIAGNOSIS — J3089 Other allergic rhinitis: Secondary | ICD-10-CM | POA: Diagnosis not present

## 2019-12-15 DIAGNOSIS — J301 Allergic rhinitis due to pollen: Secondary | ICD-10-CM | POA: Diagnosis not present

## 2019-12-17 DIAGNOSIS — M4722 Other spondylosis with radiculopathy, cervical region: Secondary | ICD-10-CM | POA: Diagnosis not present

## 2019-12-19 ENCOUNTER — Other Ambulatory Visit: Payer: Self-pay | Admitting: Family Medicine

## 2019-12-23 ENCOUNTER — Other Ambulatory Visit: Payer: Self-pay | Admitting: Family Medicine

## 2019-12-23 DIAGNOSIS — H5213 Myopia, bilateral: Secondary | ICD-10-CM | POA: Diagnosis not present

## 2019-12-23 DIAGNOSIS — H35432 Paving stone degeneration of retina, left eye: Secondary | ICD-10-CM | POA: Diagnosis not present

## 2019-12-23 DIAGNOSIS — H10413 Chronic giant papillary conjunctivitis, bilateral: Secondary | ICD-10-CM | POA: Diagnosis not present

## 2019-12-23 DIAGNOSIS — H40023 Open angle with borderline findings, high risk, bilateral: Secondary | ICD-10-CM | POA: Diagnosis not present

## 2019-12-23 DIAGNOSIS — Z961 Presence of intraocular lens: Secondary | ICD-10-CM | POA: Diagnosis not present

## 2019-12-23 DIAGNOSIS — H524 Presbyopia: Secondary | ICD-10-CM | POA: Diagnosis not present

## 2019-12-23 DIAGNOSIS — Z9889 Other specified postprocedural states: Secondary | ICD-10-CM | POA: Diagnosis not present

## 2019-12-23 DIAGNOSIS — H16223 Keratoconjunctivitis sicca, not specified as Sjogren's, bilateral: Secondary | ICD-10-CM | POA: Diagnosis not present

## 2019-12-23 DIAGNOSIS — H43813 Vitreous degeneration, bilateral: Secondary | ICD-10-CM | POA: Diagnosis not present

## 2019-12-23 NOTE — Telephone Encounter (Signed)
Requested Prescriptions  Pending Prescriptions Disp Refills   gabapentin (NEURONTIN) 300 MG capsule [Pharmacy Med Name: GABAPENTIN 300 MG CAPSULE] 90 capsule 2    Sig: TAKE 3 CAPSULES (900 MG TOTAL) BY MOUTH AT BEDTIME.     Neurology: Anticonvulsants - gabapentin Passed - 12/23/2019 10:08 AM      Passed - Valid encounter within last 12 months    Recent Outpatient Visits          2 weeks ago Post-op pain   [facility], Lilia Argue, MD   3 weeks ago Cervical spinal stenosis   [facility], Lilia Argue, MD   2 months ago Lumbar radiculopathy   [facility], Lilia Argue, MD   5 months ago Encounter for Medicare annual wellness exam   [facility], Lilia Argue, MD   6 months ago Sciatica of left side   [facility], Lilia Argue, MD      Future Appointments            In 2 weeks Rutherford Guys, MD Primary Care at [place], [facility]

## 2020-01-02 DIAGNOSIS — J301 Allergic rhinitis due to pollen: Secondary | ICD-10-CM | POA: Diagnosis not present

## 2020-01-02 DIAGNOSIS — J3089 Other allergic rhinitis: Secondary | ICD-10-CM | POA: Diagnosis not present

## 2020-01-09 ENCOUNTER — Ambulatory Visit: Payer: Medicare PPO | Admitting: Family Medicine

## 2020-01-09 ENCOUNTER — Other Ambulatory Visit: Payer: Self-pay

## 2020-01-09 ENCOUNTER — Encounter: Payer: Self-pay | Admitting: Family Medicine

## 2020-01-09 VITALS — BP 118/70 | HR 80 | Temp 98.3°F | Ht 64.0 in | Wt 127.2 lb

## 2020-01-09 DIAGNOSIS — M48061 Spinal stenosis, lumbar region without neurogenic claudication: Secondary | ICD-10-CM | POA: Diagnosis not present

## 2020-01-09 DIAGNOSIS — J3089 Other allergic rhinitis: Secondary | ICD-10-CM | POA: Diagnosis not present

## 2020-01-09 DIAGNOSIS — R1310 Dysphagia, unspecified: Secondary | ICD-10-CM

## 2020-01-09 DIAGNOSIS — Z981 Arthrodesis status: Secondary | ICD-10-CM | POA: Diagnosis not present

## 2020-01-09 DIAGNOSIS — H40051 Ocular hypertension, right eye: Secondary | ICD-10-CM | POA: Insufficient documentation

## 2020-01-09 MED ORDER — GABAPENTIN 300 MG PO CAPS: ORAL_CAPSULE | ORAL | 2 refills | Status: DC

## 2020-01-09 MED ORDER — OXYCODONE HCL 5 MG PO TABS: 5.0000 mg | ORAL_TABLET | Freq: Four times a day (QID) | ORAL | 0 refills | Status: DC | PRN

## 2020-01-09 NOTE — Progress Notes (Signed)
7/16/20219:57 AM  Kristin Tapia Apr 12, 1946, 74 y.o., female [id number]  Chief Complaint  Patient presents with  . Follow-up    swallowing trouble from the surgery due to sore throat. Has not taking some of the supplements because of this. Goes back to see neuro nx week. Had to increase the gabapentin use to 2000-3000mg  a say which has increades the tremors. If the med is reduced to calm the tremors, has pain at night. About 6 days after surgery, did have some lightheadedness with occassional fainting. This has not happened since.    HPI:   Patient is a 74 y.o. female who presents today for followup  Last OV June 2021 - had cervical fusion done June 7th Doing better that last OV Continues to have daily sore throat with difficulty swallowing Cant take larger pills or capsules No chocking or coughing with eating or drinking water Still having neck pain, specially when she wakes up in the morning Sciatica pain has flared up this past week, she has increased gabapentin dose by adding an afternoon dose, cont to take 3 caps at bedtime She is also taking tylenol 1000mg  2-3 x a day Increase in gabapentin has caused increase in essential tremor See Dr Audree Bane next week  Recently saw dr Katy Fitch, diagnosed with glaucoma and started eye drops   Depression screen [facility] 2/9 11/28/2019 10/16/2019 05/30/2019  Decreased Interest 0 - 0  Down, Depressed, Hopeless 0 0 0  PHQ - 2 Score 0 0 0  Altered sleeping - - -  Tired, decreased energy - - -  Change in appetite - - -  Feeling bad or failure about yourself  - - -  Trouble concentrating - - -  Moving slowly or fidgety/restless - - -  Suicidal thoughts - - -  PHQ-9 Score - - -    Fall Risk  11/28/2019 10/16/2019 07/08/2019 05/30/2019 02/28/2019  Falls in the past year? 0 0 - 0 0  Number falls in past yr: - - 0 0 0  Injury with Fall? - - 0 0 0  Comment - - - - -  Risk for fall due to : - - - - -  Follow up Falls evaluation completed Falls evaluation  completed - - -     Allergies  Allergen Reactions  . Sulfonamide Derivatives Anaphylaxis    Prior to Admission medications   Medication Sig Start Date End Date Taking? Authorizing Provider  ACETAMINOPHEN PO Take 1,000 mg by mouth daily.    Yes [provider]  ALPRAZolam Duanne Moron) 0.5 MG tablet Take 0.5 tablets (0.25 mg total) by mouth at bedtime as needed for anxiety. 12/09/19  Yes Rutherford Guys, MD  Calcium-Magnesium-Vitamin D (CALCIUM 1200+D3 PO) 1 tablet daily. Calcium 650mg  , Vit d mcg 05/10/18  Yes [provider]  clobetasol (TEMOVATE) 0.05 % external solution APPLY TO AFFECTED AREA 1 TO 3 TIMES A DAY FOR 10 DAYS 04/20/17  Yes [provider]  Cranberry (ELLURA) 200 MG CAPS  11/10/17  Yes [provider]  EPINEPHrine 0.3 mg/0.3 mL IJ SOAJ injection AS DIRECTED AS NEEDED FOR INJECTION 30 12/04/18  Yes [provider]  famotidine (PEPCID) 20 MG tablet TAKE 1 TABLET BY MOUTH EVERYDAY AT BEDTIME 12/05/19  Yes Rutherford Guys, MD  fexofenadine (ALLEGRA) 180 MG tablet Take 180 mg by mouth every morning.   Yes [provider]  fluconazole (DIFLUCAN) 150 MG tablet Take 1 tablet (150 mg total) by mouth every 3 (three)  days. Then continue once per week for 3 months 09/03/19  Yes Joseph Pierini, MD  gabapentin (NEURONTIN) 300 MG capsule TAKE 3 CAPSULES (900 MG TOTAL) BY MOUTH AT BEDTIME. 12/23/19  Yes Rutherford Guys, MD  ipratropium (ATROVENT) 0.03 % nasal spray Place 2 sprays into the nose 3 (three) times daily.   Yes [provider]  lidocaine (LMX) 4 % cream Apply 1 application topically as needed.   Yes [provider]  MAGNESIUM GLYCINATE PLUS PO Take 400 mg by mouth.   Yes [provider]  meloxicam (MOBIC) 7.5 MG tablet TAKE 1 TABLET (7.5 MG TOTAL) BY MOUTH DAILY WITH LUNCH. 12/19/19  Yes Rutherford Guys, MD  NONFORMULARY OR COMPOUNDED ITEM Estradiol 0.2mg /ml cream, insert 70ml vaginally at bedtime twice a week  11/17/19  Yes Rutherford Guys, MD  Omega-3 Fatty Acids (FISH OIL) 1200 MG CAPS Take 1,200 mg by mouth daily. Patient takes 2 gel caps daily   Yes [provider]    Past Medical History:  Diagnosis Date  . Abnormality of gait 03/11/2009  . Allergy    immunotherapy/[facility] [place].  Marland Kitchen Anxiety   . Arthritis   . Asthma    childhood  . Cataract   . Cataract    [employer]reesia 12/07/2019  . Cavus deformity of foot, acquired 03/11/2009  . Cystocele   . Detached retina   . GERD (gastroesophageal reflux disease)   . Hip pain 01/17/2013  . Metatarsalgia 03/11/2009  . Osteoporosis   . Postmenopausal atrophic vaginitis   . Rectocele   . Restless leg syndrome    L leg; s/p consultation by Dohmeier/Neurology.  . Sciatica of left side 01/17/2010  . Spinal stenosis   . Squamous cell carcinoma of skin 01/25/2015   L shoulder  . Thyroid disease     Past Surgical History:  Procedure Laterality Date  . APPENDECTOMY    . COLONOSCOPY  04/2009   Int & Ext Hemorrhoids, L sided diverticula.  Magod.  Repeat 5 years  . ESOPHAGOGASTRODUODENOSCOPY  04/2009   tiny HH, minimal antritis, gastric polyps. Magod.  Marland Kitchen EYE SURGERY    . MYOMECTOMY     [place].  Marland Kitchen SPINE SURGERY N/A    [employer]reesia 12/07/2019  . TONSILLECTOMY      Social History   Tobacco Use  . Smoking status: Never Smoker  . Smokeless tobacco: Never Used  Substance Use Topics  . Alcohol use: Not Currently    Family History  Problem Relation Age of Onset  . Arthritis Mother        mild  . Hypertension Mother   . Dementia Mother   . Cancer Mother 41       breast cancer  . Hyperlipidemia Mother   . Heart disease Mother   . COPD Father   . Stroke Father   . Heart disease Father   . Seizures Brother   . Cancer Brother   . Thyroid disease Brother   . Osteoporosis Neg Hx     ROS Per hpi  OBJECTIVE:  Today's Vitals   01/09/20 1026  BP: 118/70  Pulse: 80  Temp: 98.3 F (36.8 C)  TempSrc: Temporal  SpO2: 100%   Weight: 127 lb 3.2 oz (57.7 kg)  Height: 5\' 4"  (1.626 m)   Body mass index is 21.83 kg/m.   Physical Exam Vitals and nursing note reviewed.  Constitutional:      Appearance: She is well-developed.  HENT:     Head: Normocephalic and atraumatic.  Eyes:     General: No scleral icterus.    Conjunctiva/sclera: Conjunctivae normal.     Pupils: Pupils are equal, round, and reactive to light.  Pulmonary:     Effort: Pulmonary effort is normal.  Musculoskeletal:     Cervical back: Neck supple.  Skin:    General: Skin is warm and dry.  Neurological:     Mental Status: She is alert and oriented to person, place, and time.     No results found for this or any previous visit (from the past 24 hour(s)).  No results found.   ASSESSMENT and PLAN  1. S/P cervical spinal fusion 2. Spinal stenosis at L4-L5 level 3. Dysphagia, unspecified type Slow recovery, having current worsening of sciatica, dicussed treatment options, declined pred burst given recent IOP diagnosis, higher doses of gabapentin not tolerated. will do trial of low dose oxycodone, mostly at bedtime, reviewed r/se/b sp potential interactions with current meds, advised against using with bzd. Patient has upcoming appt with neurosurg. Defer to them for further mgt. pmp reviewed  4. Increased pressure in the eye, right Managed by ophtho  Other orders - gabapentin (NEURONTIN) 300 MG capsule; Take 3 capsules (900 mg total) by mouth at bedtime AND 1 capsule (300 mg total) daily in the afternoon. - oxyCODONE (OXY IR/ROXICODONE) 5 MG immediate release tablet; Take 1 tablet (5 mg total) by mouth every 6 (six) hours as needed for severe pain.  Return in about 4 weeks (around 02/06/2020).    Rutherford Guys, MD Primary Care at [place], [place] [address] Ph.  [phone] Fax [phone]

## 2020-01-09 NOTE — Patient Instructions (Signed)
° ° ° °  If you have lab work done today you will be contacted with your lab results within the next 2 weeks.  If you have not heard from us then please contact us. The fastest way to get your results is to register for My Chart. ° ° °IF you received an x-ray today, you will receive an invoice from [facility]. Please contact [facility] at [phone] with questions or concerns regarding your invoice.  ° °IF you received labwork today, you will receive an invoice from [facility]. Please contact [facility] at [phone] with questions or concerns regarding your invoice.  ° °Our billing staff will not be able to assist you with questions regarding bills from these companies. ° °You will be contacted with the lab results as soon as they are available. The fastest way to get your results is to activate your My Chart account. Instructions are located on the last page of this paperwork. If you have not heard from us regarding the results in 2 weeks, please contact this office. °  ° ° ° °

## 2020-01-14 DIAGNOSIS — M4712 Other spondylosis with myelopathy, cervical region: Secondary | ICD-10-CM | POA: Diagnosis not present

## 2020-01-14 DIAGNOSIS — M4722 Other spondylosis with radiculopathy, cervical region: Secondary | ICD-10-CM | POA: Diagnosis not present

## 2020-01-22 DIAGNOSIS — H10413 Chronic giant papillary conjunctivitis, bilateral: Secondary | ICD-10-CM | POA: Diagnosis not present

## 2020-01-22 DIAGNOSIS — Z961 Presence of intraocular lens: Secondary | ICD-10-CM | POA: Diagnosis not present

## 2020-01-22 DIAGNOSIS — H16223 Keratoconjunctivitis sicca, not specified as Sjogren's, bilateral: Secondary | ICD-10-CM | POA: Diagnosis not present

## 2020-01-22 DIAGNOSIS — H40023 Open angle with borderline findings, high risk, bilateral: Secondary | ICD-10-CM | POA: Diagnosis not present

## 2020-01-22 DIAGNOSIS — H43813 Vitreous degeneration, bilateral: Secondary | ICD-10-CM | POA: Diagnosis not present

## 2020-01-22 DIAGNOSIS — H35432 Paving stone degeneration of retina, left eye: Secondary | ICD-10-CM | POA: Diagnosis not present

## 2020-01-22 DIAGNOSIS — Z9889 Other specified postprocedural states: Secondary | ICD-10-CM | POA: Diagnosis not present

## 2020-01-23 DIAGNOSIS — J3089 Other allergic rhinitis: Secondary | ICD-10-CM | POA: Diagnosis not present

## 2020-01-23 DIAGNOSIS — J301 Allergic rhinitis due to pollen: Secondary | ICD-10-CM | POA: Diagnosis not present

## 2020-01-24 ENCOUNTER — Encounter: Payer: Self-pay | Admitting: Family Medicine

## 2020-01-27 DIAGNOSIS — Z1231 Encounter for screening mammogram for malignant neoplasm of breast: Secondary | ICD-10-CM | POA: Diagnosis not present

## 2020-01-27 LAB — HM MAMMOGRAPHY

## 2020-02-05 ENCOUNTER — Encounter: Payer: Self-pay | Admitting: Family Medicine

## 2020-02-06 ENCOUNTER — Other Ambulatory Visit: Payer: Self-pay

## 2020-02-06 ENCOUNTER — Encounter: Payer: Self-pay | Admitting: Family Medicine

## 2020-02-06 ENCOUNTER — Telehealth: Payer: Medicare PPO | Admitting: Family Medicine

## 2020-02-06 VITALS — BP 106/66 | Temp 97.1°F | Ht 64.0 in | Wt 124.0 lb

## 2020-02-06 DIAGNOSIS — Z5181 Encounter for therapeutic drug level monitoring: Secondary | ICD-10-CM

## 2020-02-06 DIAGNOSIS — Z981 Arthrodesis status: Secondary | ICD-10-CM

## 2020-02-06 DIAGNOSIS — M5416 Radiculopathy, lumbar region: Secondary | ICD-10-CM | POA: Diagnosis not present

## 2020-02-06 DIAGNOSIS — Z791 Long term (current) use of non-steroidal anti-inflammatories (NSAID): Secondary | ICD-10-CM

## 2020-02-06 MED ORDER — MELOXICAM 7.5 MG PO TABS: 7.5000 mg | ORAL_TABLET | Freq: Every day | ORAL | 3 refills | Status: DC

## 2020-02-06 NOTE — Progress Notes (Signed)
Virtual Visit Note  I connected with patient on 02/06/20 at 500pm by video epic and verified that I am speaking with the correct person using two identifiers. Kristin Tapia is currently located at home and patient is currently with them during visit. The provider, Rutherford Guys, MD is located in their office at time of visit.  I discussed the limitations, risks, security and privacy concerns of performing an evaluation and management service by telephone and the availability of in person appointments. I also discussed with the patient that there may be a patient responsible charge related to this service. The patient expressed understanding and agreed to proceed.   I provided 17 minutes of non-face-to-face time during this encounter.  Chief Complaint  Patient presents with  . Medical Management of Chronic Issues    1 month f/u     HPI ? Last OV a month ago -  Did not tolerate higher doses gabapentin, rx for oxycodone given " I am better than a month ago" Sleep still affected by inability of finding comfortable/pain free position, mostly driven by sciatica, cant sleep on her back Able to swallow better Overall pain is better, numbness is mostly resolved Sees Dr Myra Gianotti later this month - will be taking re sciatica  Has been taking APAP daily, mostly at night Using oxycodone very prn (taking 1/2 tab) Cont to take gabapentin, meloxicam and tens unit  Allergies  Allergen Reactions  . Sulfonamide Derivatives Anaphylaxis    Prior to Admission medications   Medication Sig Start Date End Date Taking? Authorizing Provider  ACETAMINOPHEN PO Take 2,000 mg by mouth daily. 2000-3000 mg daily   Yes [provider]  ALPRAZolam (XANAX) 0.5 MG tablet Take 0.5 tablets (0.25 mg total) by mouth at bedtime as needed for anxiety. 12/09/19  Yes Rutherford Guys, MD  Calcium-Magnesium-Vitamin D (CALCIUM 1200+D3 PO) 1 tablet daily. Calcium 650mg  , Vit d mcg 05/10/18  Yes [provider]  clobetasol (TEMOVATE) 0.05 % external solution APPLY TO AFFECTED AREA 1 TO 3 TIMES A DAY FOR 10 DAYS 04/20/17  Yes [provider]  Cranberry (ELLURA) 200 MG CAPS  11/10/17  Yes [provider]  EPINEPHrine 0.3 mg/0.3 mL IJ SOAJ injection AS DIRECTED AS NEEDED FOR INJECTION 30 12/04/18  Yes [provider]  famotidine (PEPCID) 20 MG tablet TAKE 1 TABLET BY MOUTH EVERYDAY AT BEDTIME 12/05/19  Yes Rutherford Guys, MD  fexofenadine (ALLEGRA) 180 MG tablet Take 180 mg by mouth every morning.   Yes [provider]  fluconazole (DIFLUCAN) 150 MG tablet Take 1 tablet (150 mg total) by mouth every 3 (three) days. Then continue once per week for 3 months 09/03/19  Yes Joseph Pierini, MD  gabapentin (NEURONTIN) 300 MG capsule Take 3 capsules (900 mg total) by mouth at bedtime AND 1 capsule (300 mg total) daily in the afternoon. 01/09/20 02/08/20 Yes Rutherford Guys, MD  ipratropium (ATROVENT) 0.03 % nasal spray Place 2 sprays into the nose 3 (three) times daily.   Yes [provider]  latanoprost (XALATAN) 0.005 % ophthalmic solution  01/15/20  Yes [provider]  lidocaine (LMX) 4 % cream Apply 1 application topically as needed.   Yes [provider]  MAGNESIUM GLYCINATE PLUS PO Take 400 mg by mouth.   Yes [provider]  meloxicam (MOBIC) 7.5 MG tablet TAKE 1 TABLET (7.5 MG TOTAL) BY MOUTH DAILY WITH LUNCH. 12/19/19  Yes Rutherford Guys, MD  NONFORMULARY OR  COMPOUNDED ITEM Estradiol 0.2mg /ml cream, insert 38ml vaginally at bedtime twice a week 11/17/19  Yes Rutherford Guys, MD  Omega-3 Fatty Acids (FISH OIL) 1200 MG CAPS Take 1,200 mg by mouth daily. Patient takes 2 gel caps daily   Yes [provider]  oxyCODONE (OXY IR/ROXICODONE) 5 MG immediate release tablet Take 1 tablet (5 mg total) by mouth every 6 (six) hours as needed for severe pain. 01/09/20  Yes Rutherford Guys, MD    Past Medical History:  Diagnosis  Date  . Abnormality of gait 03/11/2009  . Allergy    immunotherapy/Sharma Crane.  Marland Kitchen Anxiety   . Arthritis   . Asthma    childhood  . Cataract   . Cataract    [employer] 12/07/2019  . Cavus deformity of foot, acquired 03/11/2009  . Cystocele   . Detached retina   . GERD (gastroesophageal reflux disease)   . Hip pain 01/17/2013  . Metatarsalgia 03/11/2009  . Osteoporosis   . Postmenopausal atrophic vaginitis   . Rectocele   . Restless leg syndrome    L leg; s/p consultation by Dohmeier/Neurology.  . Sciatica of left side 01/17/2010  . Spinal stenosis   . Squamous cell carcinoma of skin 01/25/2015   L shoulder  . Thyroid disease     Past Surgical History:  Procedure Laterality Date  . APPENDECTOMY    . COLONOSCOPY  04/2009   Int & Ext Hemorrhoids, L sided diverticula.  [facility].  Repeat 5 years  . ESOPHAGOGASTRODUODENOSCOPY  04/2009   tiny HH, minimal antritis, gastric polyps. Magod.  Marland Kitchen EYE SURGERY    . MYOMECTOMY     [place].  Marland Kitchen SPINE SURGERY N/A    [employer]rees[employer] 12/07/2019  . TONSILLECTOMY      Social History   Tobacco Use  . Smoking status: Never Smoker  . Smokeless tobacco: Never Used  Substance Use Topics  . Alcohol use: Not Currently    Family History  Problem Relation Age of Onset  . Arthritis Mother        mild  . Hypertension Mother   . Dementia Mother   . Cancer Mother 66       breast cancer  . Hyperlipidemia Mother   . Heart disease Mother   . COPD Father   . Stroke Father   . Heart disease Father   . Seizures Brother   . Cancer Brother   . Thyroid disease Brother   . Osteoporosis Neg Hx     ROS  Per hpi  Objective  Vitals as reported by the patient:  Vitals:   02/06/20 1439  BP: 106/66  Temp: (!) 97.1 F (36.2 C)  Height: 5\' 4"  (1.626 m)  Weight: 124 lb (56.2 kg)  TempSrc: Oral  BMI (Calculated): 21.27   GEN: AAOx3, NAD HEENT: [place]/AT, pupils are symmetrical, EOMI, non-icteric sclera Resp: breathing comfortably, speaking in  full sentences Skin: no rashes noted, no pallor Psych: good eye contact, normal mood and affect  ASSESSMENT and PLAN  1. Lumbar radiculopathy 2. S/P cervical spinal fusion 3. NSAID long-term use 4. Medication monitoring encounter Recovering well from surgery. Has upcoming appt with Dr Myra Gianotti, defer to him for further treatment  - Comprehensive metabolic panel; Future - CBC; Future  Other orders  - meloxicam (MOBIC) 7.5 MG tablet; Take 1 tablet (7.5 mg total) by mouth daily with lunch.  FOLLOW-UP: pending decisions with Dr Myra Gianotti   The above assessment and management plan was discussed with the patient. The patient  verbalized understanding of and has agreed to the management plan. Patient is aware to call the clinic if symptoms persist or worsen. Patient is aware when to return to the clinic for a follow-up visit. Patient educated on when it is appropriate to go to the emergency department.     Rutherford Guys, MD [facility] at [facility] [place], [place] [address] Ph.  [phone] Fax [phone]

## 2020-02-06 NOTE — Patient Instructions (Signed)
° ° ° °  If you have lab work done today you will be contacted with your lab results within the next 2 weeks.  If you have not heard from us then please contact us. The fastest way to get your results is to register for My Chart. ° ° °IF you received an x-ray today, you will receive an invoice from [facility]. Please contact [facility] at [phone] with questions or concerns regarding your invoice.  ° °IF you received labwork today, you will receive an invoice from [facility]. Please contact [facility] at [phone] with questions or concerns regarding your invoice.  ° °Our billing staff will not be able to assist you with questions regarding bills from these companies. ° °You will be contacted with the lab results as soon as they are available. The fastest way to get your results is to activate your My Chart account. Instructions are located on the last page of this paperwork. If you have not heard from us regarding the results in 2 weeks, please contact this office. °  ° ° ° °

## 2020-02-11 ENCOUNTER — Other Ambulatory Visit: Payer: Self-pay

## 2020-02-11 ENCOUNTER — Ambulatory Visit: Payer: Medicare PPO

## 2020-02-11 DIAGNOSIS — Z5181 Encounter for therapeutic drug level monitoring: Secondary | ICD-10-CM | POA: Diagnosis not present

## 2020-02-12 DIAGNOSIS — J301 Allergic rhinitis due to pollen: Secondary | ICD-10-CM | POA: Diagnosis not present

## 2020-02-12 DIAGNOSIS — J3089 Other allergic rhinitis: Secondary | ICD-10-CM | POA: Diagnosis not present

## 2020-02-12 LAB — COMPREHENSIVE METABOLIC PANEL
ALT: 18 IU/L (ref 0–32)
AST: 22 IU/L (ref 0–40)
Albumin/Globulin Ratio: 1.8 (ref 1.2–2.2)
Albumin: 4.1 g/dL (ref 3.7–4.7)
Alkaline Phosphatase: 54 IU/L (ref 48–121)
BUN/Creatinine Ratio: 21 (ref 12–28)
BUN: 19 mg/dL (ref 8–27)
Bilirubin Total: 0.3 mg/dL (ref 0.0–1.2)
CO2: 28 mmol/L (ref 20–29)
Calcium: 9.4 mg/dL (ref 8.7–10.3)
Chloride: 97 mmol/L (ref 96–106)
Creatinine, Ser: 0.9 mg/dL (ref 0.57–1.00)
GFR calc Af Amer: 73 mL/min/{1.73_m2} (ref >59)
GFR calc non Af Amer: 63 mL/min/{1.73_m2} (ref >59)
Globulin, Total: 2.3 g/dL (ref 1.5–4.5)
Glucose: 116 mg/dL — ABNORMAL HIGH (ref 65–99)
Potassium: 4.5 mmol/L (ref 3.5–5.2)
Sodium: 135 mmol/L (ref 134–144)
Total Protein: 6.4 g/dL (ref 6.0–8.5)

## 2020-02-12 LAB — CBC
Hematocrit: 38.1 % (ref 34.0–46.6)
Hemoglobin: 12.6 g/dL (ref 11.1–15.9)
MCH: 32.8 pg (ref 26.6–33.0)
MCHC: 33.1 g/dL (ref 31.5–35.7)
MCV: 99 fL — ABNORMAL HIGH (ref 79–97)
Platelets: 246 10*3/uL (ref 150–450)
RBC: 3.84 x10E6/uL (ref 3.77–5.28)
RDW: 12.2 % (ref 11.7–15.4)
WBC: 5.7 10*3/uL (ref 3.4–10.8)

## 2020-02-13 ENCOUNTER — Encounter: Payer: Self-pay | Admitting: Family Medicine

## 2020-02-13 NOTE — Telephone Encounter (Signed)
Pt is wondering if figs would cause a spike in her sugar and wondering if she should retest in a month if she cuts back on them.

## 2020-02-22 ENCOUNTER — Encounter: Payer: Self-pay | Admitting: Family Medicine

## 2020-02-23 NOTE — Telephone Encounter (Signed)
Should she be doing anything to treat the macrocytosis? I see you note no anemia so she should not need a blood transfusion is there other treatment?

## 2020-02-27 DIAGNOSIS — M542 Cervicalgia: Secondary | ICD-10-CM | POA: Diagnosis not present

## 2020-02-27 DIAGNOSIS — M5412 Radiculopathy, cervical region: Secondary | ICD-10-CM | POA: Diagnosis not present

## 2020-03-02 ENCOUNTER — Other Ambulatory Visit: Payer: Self-pay | Admitting: Obstetrics and Gynecology

## 2020-03-02 DIAGNOSIS — M542 Cervicalgia: Secondary | ICD-10-CM | POA: Diagnosis not present

## 2020-03-02 DIAGNOSIS — M5412 Radiculopathy, cervical region: Secondary | ICD-10-CM | POA: Diagnosis not present

## 2020-03-03 DIAGNOSIS — J301 Allergic rhinitis due to pollen: Secondary | ICD-10-CM | POA: Diagnosis not present

## 2020-03-03 DIAGNOSIS — J3089 Other allergic rhinitis: Secondary | ICD-10-CM | POA: Diagnosis not present

## 2020-03-05 ENCOUNTER — Telehealth: Payer: Self-pay | Admitting: Family Medicine

## 2020-03-05 DIAGNOSIS — M542 Cervicalgia: Secondary | ICD-10-CM | POA: Diagnosis not present

## 2020-03-05 DIAGNOSIS — M5412 Radiculopathy, cervical region: Secondary | ICD-10-CM | POA: Diagnosis not present

## 2020-03-05 DIAGNOSIS — M8589 Other specified disorders of bone density and structure, multiple sites: Secondary | ICD-10-CM

## 2020-03-05 NOTE — Telephone Encounter (Signed)
Pt states she is due for DEXA scan last one 03/25/2020

## 2020-03-05 NOTE — Telephone Encounter (Signed)
Pt is stating she needs an order [;aced for a bone density test. Her last bone density test was 9/19. Please advise at [phone].

## 2020-03-08 NOTE — Telephone Encounter (Signed)
Please let patient know that order sent to [facility]. thanks

## 2020-03-08 NOTE — Telephone Encounter (Signed)
Pt called again regarding this matter. Pt would like a call regarding this. Please advise.

## 2020-03-09 ENCOUNTER — Other Ambulatory Visit: Payer: Self-pay | Admitting: Emergency Medicine

## 2020-03-09 DIAGNOSIS — M5412 Radiculopathy, cervical region: Secondary | ICD-10-CM

## 2020-03-09 DIAGNOSIS — Z791 Long term (current) use of non-steroidal anti-inflammatories (NSAID): Secondary | ICD-10-CM

## 2020-03-09 MED ORDER — MELOXICAM 7.5 MG PO TABS: 7.5000 mg | ORAL_TABLET | Freq: Every day | ORAL | 3 refills | Status: DC

## 2020-03-09 MED ORDER — GABAPENTIN 300 MG PO CAPS: ORAL_CAPSULE | ORAL | 2 refills | Status: DC

## 2020-03-09 NOTE — Telephone Encounter (Signed)
Patient was informed and patient needed a extra month supply on her meloxicam and gabapentin refill till she can find a new pcp

## 2020-03-10 DIAGNOSIS — M5412 Radiculopathy, cervical region: Secondary | ICD-10-CM | POA: Diagnosis not present

## 2020-03-10 DIAGNOSIS — M542 Cervicalgia: Secondary | ICD-10-CM | POA: Diagnosis not present

## 2020-03-15 DIAGNOSIS — M542 Cervicalgia: Secondary | ICD-10-CM | POA: Diagnosis not present

## 2020-03-15 DIAGNOSIS — M5412 Radiculopathy, cervical region: Secondary | ICD-10-CM | POA: Diagnosis not present

## 2020-03-16 ENCOUNTER — Other Ambulatory Visit: Payer: Self-pay

## 2020-03-16 DIAGNOSIS — E559 Vitamin D deficiency, unspecified: Secondary | ICD-10-CM

## 2020-03-17 ENCOUNTER — Encounter: Payer: Self-pay | Admitting: Family Medicine

## 2020-03-18 ENCOUNTER — Encounter: Payer: Self-pay | Admitting: Family Medicine

## 2020-03-18 DIAGNOSIS — Z791 Long term (current) use of non-steroidal anti-inflammatories (NSAID): Secondary | ICD-10-CM

## 2020-03-18 DIAGNOSIS — M5412 Radiculopathy, cervical region: Secondary | ICD-10-CM

## 2020-03-18 MED ORDER — MELOXICAM 7.5 MG PO TABS: 7.5000 mg | ORAL_TABLET | Freq: Every day | ORAL | 3 refills | Status: DC

## 2020-03-18 MED ORDER — GABAPENTIN 300 MG PO CAPS: ORAL_CAPSULE | ORAL | 5 refills | Status: DC

## 2020-03-22 DIAGNOSIS — M5412 Radiculopathy, cervical region: Secondary | ICD-10-CM | POA: Diagnosis not present

## 2020-03-22 DIAGNOSIS — M542 Cervicalgia: Secondary | ICD-10-CM | POA: Diagnosis not present

## 2020-03-23 DIAGNOSIS — J301 Allergic rhinitis due to pollen: Secondary | ICD-10-CM | POA: Diagnosis not present

## 2020-03-23 DIAGNOSIS — J3089 Other allergic rhinitis: Secondary | ICD-10-CM | POA: Diagnosis not present

## 2020-03-24 DIAGNOSIS — H40023 Open angle with borderline findings, high risk, bilateral: Secondary | ICD-10-CM | POA: Diagnosis not present

## 2020-03-26 DIAGNOSIS — M85852 Other specified disorders of bone density and structure, left thigh: Secondary | ICD-10-CM | POA: Diagnosis not present

## 2020-03-26 DIAGNOSIS — M810 Age-related osteoporosis without current pathological fracture: Secondary | ICD-10-CM | POA: Diagnosis not present

## 2020-03-29 DIAGNOSIS — M542 Cervicalgia: Secondary | ICD-10-CM | POA: Diagnosis not present

## 2020-03-29 DIAGNOSIS — M5412 Radiculopathy, cervical region: Secondary | ICD-10-CM | POA: Diagnosis not present

## 2020-04-07 DIAGNOSIS — M818 Other osteoporosis without current pathological fracture: Secondary | ICD-10-CM | POA: Diagnosis not present

## 2020-04-08 ENCOUNTER — Encounter: Payer: Self-pay | Admitting: Sports Medicine

## 2020-04-08 ENCOUNTER — Ambulatory Visit: Payer: Medicare PPO | Admitting: Sports Medicine

## 2020-04-08 ENCOUNTER — Other Ambulatory Visit: Payer: Self-pay

## 2020-04-08 VITALS — BP 110/60 | Ht 63.0 in | Wt 125.0 lb

## 2020-04-08 DIAGNOSIS — G5761 Lesion of plantar nerve, right lower limb: Secondary | ICD-10-CM | POA: Diagnosis not present

## 2020-04-08 DIAGNOSIS — M5416 Radiculopathy, lumbar region: Secondary | ICD-10-CM | POA: Diagnosis not present

## 2020-04-08 NOTE — Progress Notes (Signed)
PCP: Rutherford Guys, MD  Subjective:   HPI: Patient is a 74 y.o. female with history of right Morton's neuroma, cervical and lumbar radiculopathy who is here for evaluation of worsening right foot pain and left lateral lower leg pain.  Right foot pain: Patient has a known history of Morton's neuroma between her third and fourth metatarsals of her right foot.  She has had padding placed in the past and orthotics which have helped with this, however over the last 1 to 2 months she has had worsening pain in this area.  She has not had any specific injury or trauma to the area.  It has been insidious in onset.  It is worse when she walks barefoot or after she goes on long walks.  Left lateral leg pain: Patient reports onset of symptoms over the last couple of months.  The pain is mostly over her anterior lateral leg.  It is somewhat of a vague pain, she has trouble describing it.  It seems presents both at rest and with activity.  She has not noticed many inciting factors.  She is also not found much relieving factors.  She thinks maybe it got a bit swollen but has not noticed any significant redness or bruising.  She did not have any trauma or injury in the area.  Review of Systems:  Per HPI.   [place], medications and smoking status reviewed.      Objective:  Physical Exam:  [place] Adult Exercise 04/08/2020  Frequency of aerobic exercise (# of days/week) 3  Average time in minutes 45  Frequency of strengthening activities (# of days/week) 2    BP 110/60   Ht 5\' 3"  (1.6 m)   Wt 125 lb (56.7 kg)   BMI 22.14 kg/m   Gen: awake, alert, NAD, comfortable in exam room Pulm: breathing unlabored  Right foot: Inspection:  No obvious bony deformity.  No swelling, erythema, or bruising.  Preserved longitudinal arch with collapse of transverse arch. Palpation: Mildly tender palpation between the distal aspect of the third and fourth metatarsals.  She does have a palpable  Mulder's click with compression of the metatarsal heads. ROM: Full  ROM of the ankle. Normal midfoot flexibility Strength: 5/5 strength ankle in all planes Neurovascular: N/V intact distally in the lower extremity  Left lower leg: Inspection: No obvious abnormalities, no swelling, erythema or bruising. Palpation: Diffusely tender to palpation over the lateral lower extremity.  Nontender to palpation along the lateral tibia or over the tibia.  Tenderness is mostly over the muscle belly of the tibialis anterior. ROM: Intact at the ankle and knee. Strength: Normal strength with dorsiflexion of the ankle, dorsiflexion of the great toe and plantar flexion of the ankle. Neurovascularly intact distally with normal sensation of the lower extremity  Gait: Patient with very mild overpronation, otherwise fairly good alignment.  No other significant abnormalities.  Limited ultrasound examination of right foot: Examination of the distal right metatarsal heads shows a thickening consistent with chronic Morton's neuroma between the third and fourth metatarsals.  There is visible displacement of the neuroma with compression of the metatarsal heads consistent with Mulder's click.  Limited ultrasound of left leg: The common peroneal nerve is well visualized just proximal to the fibular head.  There is no significant abnormality, no swelling.  Common peroneal nerve was followed distally and division into the superficial and deep peroneal nerves is seen.  There are no abnormalities. Assessment & Plan:  1.  Morton's  neuroma between the right fourth and fifth metatarsal heads Patient with known history of Morton's neuroma and exam today is consistent with that.  An L pad and lateral heel wedge was placed in her current right orthotic to help provide more cushioning.  Patient trialed this and had improvement in her symptoms.  We will plan to trial this for now, return to clinic if persistent symptoms and may consider  further adjustments to her orthotic.  2.  Left lumbar radiculopathy Patient has known lumbar radiculopathy, suspect that her lateral leg symptoms are radicular rather than muscular.  She does not have any abnormalities visible of her peroneal nerve, suspect this is coming more proximally from her lumbar spine.  Given this, will trial gabapentin, 300 mg in the morning, 300 mg midday, 900 mg at night to see if this will help relieve some of her symptoms.  Reevaluate in 1 to 2 months.  Dagoberto Ligas, MD [facility] Fellow 04/08/2020 5:28 PM   I observed and examined the patient with the [facility] resident and agree with assessment and plan.  Note reviewed and modified by me. [name], MD

## 2020-04-12 DIAGNOSIS — M542 Cervicalgia: Secondary | ICD-10-CM | POA: Diagnosis not present

## 2020-04-12 DIAGNOSIS — M5412 Radiculopathy, cervical region: Secondary | ICD-10-CM | POA: Diagnosis not present

## 2020-04-13 DIAGNOSIS — J301 Allergic rhinitis due to pollen: Secondary | ICD-10-CM | POA: Diagnosis not present

## 2020-04-13 DIAGNOSIS — J3089 Other allergic rhinitis: Secondary | ICD-10-CM | POA: Diagnosis not present

## 2020-04-23 DIAGNOSIS — M810 Age-related osteoporosis without current pathological fracture: Secondary | ICD-10-CM | POA: Diagnosis not present

## 2020-04-23 DIAGNOSIS — Z7689 Persons encountering health services in other specified circumstances: Secondary | ICD-10-CM | POA: Diagnosis not present

## 2020-04-23 DIAGNOSIS — J302 Other seasonal allergic rhinitis: Secondary | ICD-10-CM | POA: Diagnosis not present

## 2020-04-23 DIAGNOSIS — R12 Heartburn: Secondary | ICD-10-CM | POA: Diagnosis not present

## 2020-04-23 DIAGNOSIS — M8949 Other hypertrophic osteoarthropathy, multiple sites: Secondary | ICD-10-CM | POA: Diagnosis not present

## 2020-04-23 DIAGNOSIS — N952 Postmenopausal atrophic vaginitis: Secondary | ICD-10-CM | POA: Diagnosis not present

## 2020-04-23 DIAGNOSIS — M5412 Radiculopathy, cervical region: Secondary | ICD-10-CM | POA: Diagnosis not present

## 2020-04-23 DIAGNOSIS — G479 Sleep disorder, unspecified: Secondary | ICD-10-CM | POA: Diagnosis not present

## 2020-04-23 DIAGNOSIS — M5136 Other intervertebral disc degeneration, lumbar region: Secondary | ICD-10-CM | POA: Diagnosis not present

## 2020-06-03 ENCOUNTER — Other Ambulatory Visit: Payer: Self-pay

## 2020-06-03 ENCOUNTER — Ambulatory Visit: Payer: Medicare PPO | Admitting: Sports Medicine

## 2020-06-03 VITALS — BP 106/74 | Ht 63.5 in | Wt 125.0 lb

## 2020-06-03 DIAGNOSIS — G5761 Lesion of plantar nerve, right lower limb: Secondary | ICD-10-CM

## 2020-06-03 DIAGNOSIS — M5416 Radiculopathy, lumbar region: Secondary | ICD-10-CM

## 2020-06-03 NOTE — Progress Notes (Signed)
Office Visit Note   Patient: Kristin Tapia           Date of Birth: June 09, 1946           MRN: [id number] Visit Date: 06/03/2020 Requested by: Daleen Squibb, MD [address],  [place] [address] PCP: Mancel Bale, PA-C  Subjective: CC: F/U Right Morton's Neuroma, Left Lumbar radiculopathy   HPI: 74 year old female presenting to clinic today with concerns of worsening pain in her right foot with history of known Morton's neuroma.  Additionally, patient states that she has been followed by a pain clinic who has switched her from gabapentin to Lyrica, which she states makes her feel "wired."  She also noticed a blister on the posterior aspect of her left foot, which she says was concerningly nonpainful for several days.  Per her right foot, patient states that previous insoles were much more comfortable than the most recent pair she was given.  She is wondering if she can get modifications of her other insoles to match this more comfortable pair. Per her lower back pain and lumbar radiculopathy, patient states her pain continues to bother her significantly-waking her up at night and preventing her from getting restful sleep.  Her pain provider started her on Lyrica which she has been titrating upward as well as oxycodone.  She states that her Lyrica has been causing severe insomnia, however the oxycodone has been sedating enough to allow her to get minimal sleep.  She has a follow-up pending with her pain provider to help review this and discuss possible alternative medications or further titration. Per the blister on her foot, patient says that she tried wearing new slippers, and after a few days she noticed a significant 2 cm large blister on the posterior aspect of her left foot.  Patient states that she was worried that she did not feel this blister previously, and it was only minimally painful when she noticed it.  She tried draining it on her own with a needle and  drew off quite a bit of fluid.  She feels as though it is healing well now, however she was worried at the lack of sensation previously.  She denies any other foot wounds, or history of nonhealing wounds.              Past HX Evaluation by Dr Ellene Route for surgery to resolve spondylolithesis at L5/S1 and marked spinal stenosis Wants to wait until Rx of Osteoporosis by Dr.Bassett. Had successful cervical spine surgery  ROS:   All other systems were reviewed and are negative.  Objective: Vital Signs: BP 106/74   Ht 5' 3.5" (1.613 m)   Wt 125 lb (56.7 kg)   BMI 21.80 kg/m   Physical Exam:  General:  Alert and oriented, in no acute distress. Pulm:  Breathing unlabored. Psy:  Normal mood, congruent affect. Skin: Left posterior heel with approximately 2 cm diameter ulcerative lesion.  No surrounding erythema.  Appears to be healing well. Right foot with tenderness to palpation between third and fourth metatarsals. Mulder's click appreciated with metatarsal squeeze.  No obvious bony deformity, no swelling or erythema.  Collapse of transverse arch when standing. Left foot with no obvious swelling, deformity.  5 out of 5 strength with plantar and dorsiflexion as well as ankle inversion and eversion. Sensation intact to very light touch throughout the sole of the foot, as well as the posterior heel.  Imaging: No results found.  Assessment &  Plan: 74 year old female presenting to clinic with concerns of worsening of right Morton's neuroma, as well as lumbar radiculopathy affecting the left lower extremity. -for treatment of Morton's neuroma, given patient's improved comfort with previous insoles we modified all 4 pairs of her previously provided orthotics today to mimic the cushioning on the most comfortable pair.  Optimistic that this should offload the uncomfortable area and enough to help improve her ambulation.  Per her radiculopathy, patient has been referred to pain clinic who is managing  titration of her medications.  Unfortunately, she has significant osteoporosis which is currently preventing her spinal surgery, though ultimately it seems that this is what she will need to improve her significant radicular symptoms.  Reassuringly, she has good sensation on exam today, however due to her recent blistering recommend she perform regular foot checks to assure no further wounds on her skin. Note we use a lateral wedge on this side and that seems to lessen the lateral pressure that occurs with her radicular sxs.     Procedures: No procedures performed       I observed and examined the patient with the [facility] resident and agree with assessment and plan.  Note reviewed and modified by me. [name], MD

## 2020-06-08 ENCOUNTER — Ambulatory Visit: Payer: Medicare PPO | Admitting: Sports Medicine

## 2020-06-14 ENCOUNTER — Other Ambulatory Visit: Payer: Self-pay | Admitting: Neurological Surgery

## 2020-06-14 DIAGNOSIS — M818 Other osteoporosis without current pathological fracture: Secondary | ICD-10-CM

## 2020-07-05 ENCOUNTER — Other Ambulatory Visit: Payer: Self-pay

## 2020-07-05 ENCOUNTER — Ambulatory Visit
Admission: RE | Admit: 2020-07-05 | Discharge: 2020-07-05 | Disposition: A | Discharge status: Home / Self Care | Payer: Medicare PPO | Source: Ambulatory Visit | Attending: Neurological Surgery | Admitting: Neurological Surgery

## 2020-07-05 DIAGNOSIS — M818 Other osteoporosis without current pathological fracture: Secondary | ICD-10-CM

## 2022-01-09 ENCOUNTER — Ambulatory Visit: Payer: Medicare PPO | Admitting: Family Medicine

## 2022-01-09 VITALS — BP 124/72 | Ht 64.0 in | Wt 129.0 lb

## 2022-01-09 DIAGNOSIS — M79671 Pain in right foot: Secondary | ICD-10-CM | POA: Diagnosis not present

## 2022-01-09 DIAGNOSIS — M722 Plantar fascial fibromatosis: Secondary | ICD-10-CM

## 2022-01-09 DIAGNOSIS — M79672 Pain in left foot: Secondary | ICD-10-CM | POA: Diagnosis not present

## 2022-01-10 ENCOUNTER — Encounter: Payer: Self-pay | Admitting: Family Medicine

## 2022-01-10 NOTE — Progress Notes (Signed)
PCP: Mancel Bale, PA-C  Subjective:   HPI: Patient is a 76 y.o. female here for ankle and foot pain.  Patient reports she's had several months of anterior left ankle pain, plantar left heel pain. Heel pain worse when she gets up in the morning, better when she's walking and on her feet. Has progressed to where she limps before she gets going. No acute injury. Also with right medial ankle pain No numbness or tingling with this. Has orthotics but unsure if these are broken down and she needs new ones.  Past Medical History:  Diagnosis Date   Abnormality of gait 03/11/2009   Allergy    immunotherapy/[facility] [place].   Anxiety    Arthritis    Asthma    childhood   Cataract    Cataract    [employer] 12/07/2019   Cavus deformity of foot, acquired 03/11/2009   Cystocele    Detached retina    GERD (gastroesophageal reflux disease)    Hip pain 01/17/2013   Metatarsalgia 03/11/2009   Osteoporosis    Postmenopausal atrophic vaginitis    Rectocele    Restless leg syndrome    L leg; s/p consultation by Dohmeier/Neurology.   Sciatica of left side 01/17/2010   Spinal stenosis    Squamous cell carcinoma of skin 01/25/2015   L shoulder   Thyroid disease     Current Outpatient Medications on File Prior to Visit  Medication Sig Dispense Refill   ACETAMINOPHEN PO Take 2,000 mg by mouth daily. 2000-3000 mg daily     ALPRAZolam (XANAX) 0.5 MG tablet Take 0.5 tablets (0.25 mg total) by mouth at bedtime as needed for anxiety. 30 tablet 1   Calcium-Magnesium-Vitamin D (CALCIUM 1200+D3 PO) 1 tablet daily. Calcium '650mg'$  , Vit d mcg     clobetasol (TEMOVATE) 0.05 % external solution APPLY TO AFFECTED AREA 1 TO 3 TIMES A DAY FOR 10 DAYS  1   Cranberry (ELLURA) 200 MG CAPS      EPINEPHrine 0.3 mg/0.3 mL IJ SOAJ injection AS DIRECTED AS NEEDED FOR INJECTION 30     famotidine (PEPCID) 20 MG tablet TAKE 1 TABLET BY MOUTH EVERYDAY AT BEDTIME 90 tablet 1   fexofenadine (ALLEGRA) 180 MG tablet Take  180 mg by mouth every morning.     fluconazole (DIFLUCAN) 150 MG tablet Take 1 tablet (150 mg total) by mouth every 3 (three) days. Then continue once per week for 3 months 14 tablet 0   gabapentin (NEURONTIN) 300 MG capsule Take 3 capsules (900 mg total) by mouth at bedtime AND 1 capsule (300 mg total) daily in the afternoon. 120 capsule 5   ipratropium (ATROVENT) 0.03 % nasal spray Place 2 sprays into the nose 3 (three) times daily.     latanoprost (XALATAN) 0.005 % ophthalmic solution      lidocaine (LMX) 4 % cream Apply 1 application topically as needed.     MAGNESIUM GLYCINATE PLUS PO Take 400 mg by mouth.     meloxicam (MOBIC) 7.5 MG tablet Take 1 tablet (7.5 mg total) by mouth daily with lunch. 90 tablet 3   NONFORMULARY OR COMPOUNDED ITEM Estradiol 0.'2mg'$ /ml cream, insert 42m vaginally at bedtime twice a week 36 each 3   Omega-3 Fatty Acids (FISH OIL) 1200 MG CAPS Take 1,200 mg by mouth daily. Patient takes 2 gel caps daily     oxyCODONE (OXY IR/ROXICODONE) 5 MG immediate release tablet Take 1 tablet (5 mg total) by mouth every 6 (six) hours as needed  for severe pain. 30 tablet 0   pregabalin (LYRICA) 50 MG capsule 50 mg at night for 3 days, 50 mg BID for 3 days, 50 mg a.m., 100 mg p.m. from thereafter     No current facility-administered medications on file prior to visit.    Past Surgical History:  Procedure Laterality Date   APPENDECTOMY     COLONOSCOPY  04/2009   Int & Ext Hemorrhoids, L sided diverticula.  [facility].  Repeat 5 years   ESOPHAGOGASTRODUODENOSCOPY  04/2009   tiny HH, minimal antritis, gastric polyps. [facility].   EYE SURGERY     MYOMECTOMY     [facility].   SPINE SURGERY N/A    [employer] 12/07/2019   TONSILLECTOMY      Allergies  Allergen Reactions   Sulfonamide Derivatives Anaphylaxis    BP 124/72   Ht '5\' 4"'$  (1.626 m)   Wt 129 lb (58.5 kg)   BMI 22.14 kg/m      04/08/2020    1:26 PM 04/09/2020    9:52 AM  [facility]   Frequency of aerobic exercise (# of days/week) 7 7  Average time in minutes 60 60  Frequency of strengthening activities (# of days/week) 3 3        No data to display              Objective:  Physical Exam:  Gen: NAD, comfortable in exam room  Right foot/ankle: Mild pronation long arch.  Transverse arch collapse.  No other gross deformity, swelling, ecchymoses FROM No TTP throughout though points to course of post tib tendon as area of pain. Thompsons test negative. NV intact distally.  Left foot/ankle: Mild pronation long arch.  Worse transverse arch collapse than right.  No other gross deformity, swelling, ecchymoses FROM TTP plantar fascia at insertion on calcaneus.  Minimal tenderness anterior ankle joint.  No other tenderness. Negative syndesmotic compression. Thompsons test negative. NV intact distally.   Assessment & Plan:  1. Bilateral foot and ankle pain - combination of issues - left ankle arthritis, plantar fasciitis; right post tib tendinopathy.  Home exercises and stretches provided for plantar fascia.  Orthotics are very worn so new pair made today which should help these issues.  Discussed supplements, topical medications for arthritis.  Encouraged continued exercise as she has been.  Patient was fitted for a : standard, cushioned, semi-rigid orthotic. The orthotic was heated and afterward the patient stood on the orthotic blank positioned on the orthotic stand. The patient was positioned in subtalar neutral position and 10 degrees of ankle dorsiflexion in a weight bearing stance. After completion of molding, a stable base was applied to the orthotic blank. The blank was ground to a stable position for weight bearing. Size: 9  Base: blue med density eva Posting: none Additional orthotic padding: additional padding for metatarsal cushioning

## 2022-01-18 ENCOUNTER — Ambulatory Visit: Payer: Medicare PPO | Admitting: Family Medicine

## 2022-01-18 VITALS — BP 112/65 | Ht 63.5 in | Wt 128.0 lb

## 2022-01-18 DIAGNOSIS — M79671 Pain in right foot: Secondary | ICD-10-CM

## 2022-01-18 DIAGNOSIS — M79672 Pain in left foot: Secondary | ICD-10-CM | POA: Diagnosis not present

## 2022-01-19 ENCOUNTER — Encounter: Payer: Self-pay | Admitting: Family Medicine

## 2022-01-19 NOTE — Progress Notes (Signed)
PCP: Mancel Bale, PA-C  Subjective:   HPI: Patient is a 76 y.o. female here for orthotics.  Patient returns today for an additional pair of orthotics. Doing well with ones made at last office visit. Unfortunately she had a fall in the bathroom - did not lose consciousness and no prodrome symptoms. But since then has had more pain in sacral/coccyx area and pain left lateral lower leg though no bruising in the latter. Still with plantar left foot pain - doing well with orthotics but no significant decrease in pain yet.  Past Medical History:  Diagnosis Date   Abnormality of gait 03/11/2009   Allergy    immunotherapy/[facility].   Anxiety    Arthritis    Asthma    childhood   Cataract    Cataract    [employer]ia 12/07/2019   Cavus deformity of foot, acquired 03/11/2009   Cystocele    Detached retina    GERD (gastroesophageal reflux disease)    Hip pain 01/17/2013   Metatarsalgia 03/11/2009   Osteoporosis    Postmenopausal atrophic vaginitis    Rectocele    Restless leg syndrome    L leg; s/p consultation by [facility]/Neurology.   Sciatica of left side 01/17/2010   Spinal stenosis    Squamous cell carcinoma of skin 01/25/2015   L shoulder   Thyroid disease     Current Outpatient Medications on File Prior to Visit  Medication Sig Dispense Refill   ACETAMINOPHEN PO Take 2,000 mg by mouth daily. 2000-3000 mg daily     ALPRAZolam (XANAX) 0.5 MG tablet Take 0.5 tablets (0.25 mg total) by mouth at bedtime as needed for anxiety. 30 tablet 1   Calcium-Magnesium-Vitamin D (CALCIUM 1200+D3 PO) 1 tablet daily. Calcium '650mg'$  , Vit d mcg     clobetasol (TEMOVATE) 0.05 % external solution APPLY TO AFFECTED AREA 1 TO 3 TIMES A DAY FOR 10 DAYS  1   Cranberry (ELLURA) 200 MG CAPS      EPINEPHrine 0.3 mg/0.3 mL IJ SOAJ injection AS DIRECTED AS NEEDED FOR INJECTION 30     famotidine (PEPCID) 20 MG tablet TAKE 1 TABLET BY MOUTH EVERYDAY AT BEDTIME 90 tablet 1   fexofenadine (ALLEGRA) 180  MG tablet Take 180 mg by mouth every morning.     fluconazole (DIFLUCAN) 150 MG tablet Take 1 tablet (150 mg total) by mouth every 3 (three) days. Then continue once per week for 3 months 14 tablet 0   gabapentin (NEURONTIN) 300 MG capsule Take 3 capsules (900 mg total) by mouth at bedtime AND 1 capsule (300 mg total) daily in the afternoon. 120 capsule 5   ipratropium (ATROVENT) 0.03 % nasal spray Place 2 sprays into the nose 3 (three) times daily.     latanoprost (XALATAN) 0.005 % ophthalmic solution      lidocaine (LMX) 4 % cream Apply 1 application topically as needed.     MAGNESIUM GLYCINATE PLUS PO Take 400 mg by mouth.     meloxicam (MOBIC) 7.5 MG tablet Take 1 tablet (7.5 mg total) by mouth daily with lunch. 90 tablet 3   NONFORMULARY OR COMPOUNDED ITEM Estradiol 0.'2mg'$ /ml cream, insert 106m vaginally at bedtime twice a week 36 each 3   Omega-3 Fatty Acids (FISH OIL) 1200 MG CAPS Take 1,200 mg by mouth daily. Patient takes 2 gel caps daily     oxyCODONE (OXY IR/ROXICODONE) 5 MG immediate release tablet Take 1 tablet (5 mg total) by mouth every 6 (six) hours as needed  for severe pain. 30 tablet 0   pregabalin (LYRICA) 50 MG capsule 50 mg at night for 3 days, 50 mg BID for 3 days, 50 mg a.m., 100 mg p.m. from thereafter     No current facility-administered medications on file prior to visit.    Past Surgical History:  Procedure Laterality Date   APPENDECTOMY     COLONOSCOPY  04/2009   Int & Ext Hemorrhoids, L sided diverticula.  [facility].  Repeat 5 years   ESOPHAGOGASTRODUODENOSCOPY  04/2009   tiny HH, minimal antritis, gastric polyps. [facility].   EYE SURGERY     MYOMECTOMY     [facility].   SPINE SURGERY N/A    [employer] 12/07/2019   TONSILLECTOMY      Allergies  Allergen Reactions   Sulfonamide Derivatives Anaphylaxis    BP 112/65   Ht 5' 3.5" (1.613 m)   Wt 128 lb (58.1 kg)   BMI 22.32 kg/m      04/08/2020    1:26 PM 04/09/2020    9:52 AM  [facility]  Exercise  Frequency of aerobic exercise (# of days/week) 7 7  Average time in minutes 60 60  Frequency of strengthening activities (# of days/week) 3 3        No data to display              Objective:  Physical Exam:  Gen: NAD, comfortable in exam room  See last office visit for exam.   Assessment & Plan:  1. Gait abnormality - new pair of orthotics made today and comfortable to patient.  See below.  Patient was fitted for a : standard, cushioned, semi-rigid orthotic. The orthotic was heated and afterward the patient stood on the orthotic blank positioned on the orthotic stand. The patient was positioned in subtalar neutral position and 10 degrees of ankle dorsiflexion in a weight bearing stance. After completion of molding, a stable base was applied to the orthotic blank. The blank was ground to a stable position for weight bearing. Size: 9 Base: blue med density eva Posting: none Additional orthotic padding: light blue eva under metatarsal heads/forefoot

## 2022-02-22 ENCOUNTER — Ambulatory Visit (INDEPENDENT_AMBULATORY_CARE_PROVIDER_SITE_OTHER): Payer: Self-pay | Admitting: Family Medicine

## 2022-02-22 VITALS — Ht 63.5 in | Wt 128.0 lb

## 2022-02-22 DIAGNOSIS — M722 Plantar fascial fibromatosis: Secondary | ICD-10-CM

## 2022-02-22 NOTE — Progress Notes (Unsigned)
   Established Patient Office Visit  Subjective   Patient ID: Kristin Tapia, female    DOB: 1945/10/12  Age: 76 y.o. MRN: [id number]  Left foot plantar fasciitis.  Ms. Kristin Tapia is here today with chief complaint of left foot plantar fasciitis that been bothering her for the past 6 to 8 weeks.  She has had some improvement in her pain she has been using the heel cup, icing, fascial massage and acupuncture.  She had to discontinue her meloxicam secondary to kidney injury but is taking Tylenol to help with her pain.  She stretches frequently when seated for prolonged periods of time.  She initially tried the night splint however she was unable to continue the night splint as she was having trouble getting in and out of her sandals when she went to the restroom.  With her pain being so persistent she has returned for shockwave therapy today.  She denies any injury to the area.   ROSas listed above in HPI    Objective:     Ht 5' 3.5" (1.613 m)   Wt 128 lb (58.1 kg)   BMI 22.32 kg/m   Physical Exam Vitals reviewed.  Constitutional:      General: She is not in acute distress.    Appearance: Normal appearance. She is normal weight. She is not ill-appearing, toxic-appearing or diaphoretic.  Pulmonary:     Effort: Pulmonary effort is normal.  Neurological:     Mental Status: She is alert.   Left foot: No obvious deformity or asymmetry.  No ecchymosis or edema.  She has tenderness to palpation at the origin of her plantar fascia on the calcaneus and medial calcaneus.  Full range of motion plantarflexion, dorsiflexion, supination and pronation.  Strength 5/5 plantarflexion and dorsiflexion, no reproduction of her pain.    Assessment & Plan:   Problem List Items Addressed This Visit       Musculoskeletal and Integument   Plantar fasciitis of left foot - Primary    Ms. Kristin Tapia has plantar fascial and this is been bothersome for the past 6 to 8 weeks.  She is here today for shockwave treatment,  procedure detailed below.  She tolerated this well.  We will follow her up in 1 week for repeat treatment.  We discussed continuing Tylenol as needed for pain, icing and stretching.  She verbalized understanding.  Patient will continue with her daily ambulation, with pain as her guide.  She will continue to walk only in hard soled shoes.     Procedure: ECSWT Indications:  plantarfasciitis    Procedure Details Consent: Risks of procedure as well as the alternatives and risks of each were explained to the patient.  Written consent for procedure obtained. Time Out: Verified patient identification, verified procedure, site was marked, verified correct patient position, medications/allergies/relevent history reviewed.  The area was cleaned with alcohol swab.     The left plantar fascia was targeted for Extracorporeal shockwave therapy.    Preset: Plantarfasciitis Power Level: 60 Frequency: 10 Impulse/cycles: 2500 Head size: large   Patient tolerated procedure well without immediate complications   Return in about 1 week (around 03/01/2022).    Elmore Guise, DO

## 2022-02-22 NOTE — Assessment & Plan Note (Addendum)
Ms. Kristin Tapia has plantar fascial and this is been bothersome for the past 6 to 8 weeks.  She is here today for shockwave treatment, procedure detailed below.  She tolerated this well.  We will follow her up in 1 week for repeat treatment.  We discussed continuing Tylenol as needed for pain, icing and stretching.  She verbalized understanding.  Patient will continue with her daily ambulation, with pain as her guide.  She will continue to walk only in hard soled shoes.

## 2022-02-23 ENCOUNTER — Encounter: Payer: Self-pay | Admitting: Family Medicine

## 2022-03-01 ENCOUNTER — Ambulatory Visit (INDEPENDENT_AMBULATORY_CARE_PROVIDER_SITE_OTHER): Payer: Self-pay | Admitting: Sports Medicine

## 2022-03-01 VITALS — Ht 63.5 in

## 2022-03-01 DIAGNOSIS — M722 Plantar fascial fibromatosis: Secondary | ICD-10-CM

## 2022-03-01 NOTE — Progress Notes (Signed)
   Established Patient Office Visit  Subjective   Patient ID: Kristin Tapia, female    DOB: [birth year]  Age: 76 y.o. MRN: [id number]  Shockwave treatment.  Ms. Kristin Tapia presents today for shockwave treatment for chronic left foot plantar fasciitis.  She underwent shockwave therapy last week.  She reports she had a 24-hour improvement after treatment.  The following days she had some increased discomfort in her pain returned below baseline.  She was continued with myofascial massage and heel cup inserts in his shoes.  She denies any new injury to the area, worsening pain or numbness and tingling.  ROS as listed above in HPI    Objective:     Ht 5' 3.5" (1.613 m)   BMI 22.32 kg/m   Physical Exam Constitutional:      General: She is not in acute distress.    Appearance: Normal appearance. She is not ill-appearing, toxic-appearing or diaphoretic.  Pulmonary:     Effort: Pulmonary effort is normal.  Neurological:     Mental Status: She is alert.   Left foot: No obvious deformity or asymmetry.  No ecchymosis or edema.  Tenderness to palpation at the origin of the plantar fascia.  Negative calcaneal squeeze.  Nonantalgic gait.    Assessment & Plan:   Problem List Items Addressed This Visit       Musculoskeletal and Integument   Plantar fasciitis of left foot - Primary    Session #2 of shockwave therapy to the left plantar fascia today.  Detailed below. Patient reports some improvement in her discomfort since treatment last week would recommend third treatment in 1 week's time.  We will follow-up on pain in the following week.     Procedure: ECSWT Indications: Plantar fasciitis   Procedure Details Consent: Risks of procedure as well as the alternatives and risks of each were explained to the patient.  Written consent for procedure obtained. Time Out: Verified patient identification, verified procedure, site was marked, verified correct patient position,  medications/allergies/relevent history reviewed.  The area was cleaned with alcohol swab.     The left plantar fascia was targeted for Extracorporeal shockwave therapy.    Preset: Plantar fasciitis Power Level: 80 Frequency: 10 Impulse/cycles: 2500 Head size: Large   Patient tolerated procedure well without immediate complications   Return in about 1 week (around 03/08/2022).    Elmore Guise, DO  Addendum:  Patient seen in the office by fellow.  Her history, exam, plan of care were precepted with me.  Kristin Lemon MD Kirt Boys

## 2022-03-01 NOTE — Assessment & Plan Note (Signed)
Session #2 of shockwave therapy to the left plantar fascia today.  Detailed below. Patient reports some improvement in her discomfort since treatment last week would recommend third treatment in 1 week's time.  We will follow-up on pain in the following week.

## 2022-03-02 ENCOUNTER — Encounter: Payer: Self-pay | Admitting: Sports Medicine

## 2022-03-08 ENCOUNTER — Ambulatory Visit (INDEPENDENT_AMBULATORY_CARE_PROVIDER_SITE_OTHER): Payer: Self-pay | Admitting: Sports Medicine

## 2022-03-08 VITALS — Ht 63.5 in

## 2022-03-08 DIAGNOSIS — M722 Plantar fascial fibromatosis: Secondary | ICD-10-CM

## 2022-03-08 NOTE — Progress Notes (Signed)
   Established Patient Office Visit  Subjective   Patient ID: Kristin Tapia, female    DOB: Apr 20, 1946  Age: 76 y.o. MRN: [id number]  Shockwave therapy x3.  Mrs. Hase is here today for repeat shockwave therapy to her chronic plantar fasciitis. She has gotten some short term relief from pervious sessions on 8/30 and 9/6. She has been able to ambulate well on her daily walks but does have some pain first steps in the morning or after prolonged sitting. She denies any new injuries or numbness and tingling.   ROSas listed above in HPI    Objective:     Ht 5' 3.5" (1.613 m)   BMI 22.32 kg/m   Physical Exam Vitals reviewed.  Constitutional:      General: She is not in acute distress.    Appearance: Normal appearance. She is not ill-appearing, toxic-appearing or diaphoretic.  Pulmonary:     Effort: Pulmonary effort is normal.  Neurological:     Mental Status: She is alert.   L foot: No obvious deformity or asymmetry.  No ecchymosis or edema.  Tenderness to palpation at the origin of the plantar fascia.  Negative calcaneal squeeze.  Nonantalgic gait.        Assessment & Plan:   Procedure: ECSWT Indications: Plantar fasciitis   Procedure Details Consent: Risks of procedure as well as the alternatives and risks of each were explained to the patient.  Written consent for procedure obtained. Time Out: Verified patient identification, verified procedure, site was marked, verified correct patient position, medications/allergies/relevent history reviewed.   The left plantar fascia was targeted for Extracorporeal shockwave therapy.    Preset: Plantar fasciitis Power Level: 90 Frequency: 10 Impulse/cycles: 3000 Head size: Large   Patient tolerated procedure well without immediate complications  Problem List Items Addressed This Visit       Musculoskeletal and Integument   Plantar fasciitis of left foot - Primary    Session #3 of shockwave therapy to the left plantar  fascia today.  Detailed above. Patient reports some improvement in her discomfort since treatment last week but pain returns.  Repeat session and evaluation in one week. Continue insoles and stretching regimen        Return in about 1 week (around 03/15/2022).    Elmore Guise, DO  Addendum:  Patient seen in the office by fellow.  Her history, exam, plan of care were precepted with me.  Karlton Lemon MD Kirt Boys

## 2022-03-08 NOTE — Assessment & Plan Note (Signed)
Session #3 of shockwave therapy to the left plantar fascia today.  Detailed above. Patient reports some improvement in her discomfort since treatment last week but pain returns.  Repeat session and evaluation in one week. Continue insoles and stretching regimen

## 2022-03-15 ENCOUNTER — Ambulatory Visit (INDEPENDENT_AMBULATORY_CARE_PROVIDER_SITE_OTHER): Payer: Self-pay | Admitting: Sports Medicine

## 2022-03-15 VITALS — Ht 63.5 in

## 2022-03-15 DIAGNOSIS — M722 Plantar fascial fibromatosis: Secondary | ICD-10-CM

## 2022-03-15 NOTE — Assessment & Plan Note (Addendum)
Session #4 of shockwave therapy for planter fasciitis, detailed below.  Patient is to continue with insoles, stretching and topical Voltaren gel as tolerated. At this point we will follow-up with patient in 4 weeks.  If she continues to have some discomfort she may extend shockwave therapy to 6 treatments total.  Patient will call if her pain worsens over the next couple weeks.

## 2022-03-15 NOTE — Progress Notes (Signed)
   Established Patient Office Visit  Subjective   Patient ID: Kristin Tapia, female    DOB: 1946-05-21  Age: 76 y.o. MRN: [id number]  Chronic plantar fasciitis, shockwave.  Ms. Kristin Tapia presents today for shockwave treatment #4 for chronic left foot planter fasciitis.  She had some relief from her previous treatment however feels like it was more sporadic than in the past.  She has been continuing with her stretching, heel cups and walking regimen.  Since we last saw her she has started using some Voltaren gel that gave her great relief.  She is unable to take oral anti-inflammatories and will discuss the Voltaren with her primary care provider in the setting of her kidneys.  She denies any further injury to the area.   ROS as listed above in HPI    Objective:     Ht 5' 3.5" (1.613 m)   BMI 22.32 kg/m   Physical Exam Vitals reviewed.  Constitutional:      General: She is not in acute distress.    Appearance: Normal appearance. She is normal weight. She is not ill-appearing, toxic-appearing or diaphoretic.  HENT:     Head: Normocephalic.  Pulmonary:     Effort: Pulmonary effort is normal.  Neurological:     Mental Status: She is alert.   Left foot: No obvious deformity or asymmetry.  No ecchymosis or edema.  Some slight discomfort with palpation to the origin of the plantar fascia.     Assessment & Plan:   Problem List Items Addressed This Visit       Musculoskeletal and Integument   Plantar fasciitis of left foot - Primary    Session #4 of shockwave therapy for planter fasciitis, detailed below.  Patient is to continue with insoles, stretching and topical Voltaren gel as tolerated. At this point we will follow-up with patient in 4 weeks.  If she continues to have some discomfort she may extend shockwave therapy to 6 treatments total.  Patient will call if her pain worsens over the next couple weeks.         Procedure: ECSWT Indications: Plantar fasciitis   Procedure  Details Consent: Risks of procedure as well as the alternatives and risks of each were explained to the patient.  Written consent for procedure obtained. Time Out: Verified patient identification, verified procedure, site was marked, verified correct patient position, medications/allergies/relevent history reviewed.   The left plantar fascia was targeted for Extracorporeal shockwave therapy.    Preset: Plantar fasciitis Power Level: 100 Frequency: 10 Impulse/cycles: 3000 Head size: Large   Patient tolerated procedure well without immediate complications   Return in about 4 weeks (around 04/12/2022).    Elmore Guise, DO  Addendum:  Patient seen in the office by fellow.  Her history, exam, plan of care were precepted with me.  Karlton Lemon MD Kristin Tapia

## 2022-03-17 ENCOUNTER — Encounter: Payer: Self-pay | Admitting: Sports Medicine

## 2022-03-20 NOTE — Addendum Note (Signed)
Addended by: Cyd Silence on: 03/20/2022 08:40 AM   Modules accepted: Orders

## 2022-04-12 ENCOUNTER — Ambulatory Visit: Payer: Medicare PPO | Admitting: Sports Medicine

## 2022-09-04 ENCOUNTER — Ambulatory Visit: Payer: Medicare PPO | Admitting: Family Medicine

## 2022-09-04 VITALS — BP 124/70 | Ht 63.5 in | Wt 130.0 lb

## 2022-09-04 DIAGNOSIS — M79672 Pain in left foot: Secondary | ICD-10-CM | POA: Diagnosis not present

## 2022-09-04 DIAGNOSIS — M79671 Pain in right foot: Secondary | ICD-10-CM | POA: Diagnosis not present

## 2022-09-04 NOTE — Progress Notes (Unsigned)
PCP: Mancel Bale, PA-C  Subjective:   HPI: Patient is a 77 y.o. female here for bilateral foot pain.  She reports she has had bilateral foot pain ongoing for several months located along the medial aspect of the foot radiating anterior to the medial malleolus up to the medial side of her shin, right greater than left Pain is episodic, occurring multiple times a day She thinks that balance classes which involve ankle exercises has been making his pain worse She has tried acupuncture and deep tissue massage with short-lived relief No known injury Denies numbness, tingling, back pain. She currently wears orthotics.  She has been doing piriformis exercises.  Walking more than 1 hour/day.  Past Medical History:  Diagnosis Date   Abnormality of gait 03/11/2009   Allergy    immunotherapy/[facility] [place].   Anxiety    Arthritis    Asthma    childhood   Cataract    Cataract    Phreesia 12/07/2019   Cavus deformity of foot, acquired 03/11/2009   Cystocele    Detached retina    GERD (gastroesophageal reflux disease)    Hip pain 01/17/2013   Metatarsalgia 03/11/2009   Osteoporosis    Postmenopausal atrophic vaginitis    Rectocele    Restless leg syndrome    L leg; s/p consultation by [facility]/Neurology.   Sciatica of left side 01/17/2010   Spinal stenosis    Squamous cell carcinoma of skin 01/25/2015   L shoulder   Thyroid disease     Current Outpatient Medications on File Prior to Visit  Medication Sig Dispense Refill   Abaloparatide (TYMLOS) 3120 MCG/1.56ML SOPN Inject into the skin.     ACETAMINOPHEN PO Take 2,000 mg by mouth daily. 2000-3000 mg daily     ALPRAZolam (XANAX) 0.5 MG tablet Take 0.5 tablets (0.25 mg total) by mouth at bedtime as needed for anxiety. 30 tablet 1   Calcium-Magnesium-Vitamin D (CALCIUM 1200+D3 PO) 1 tablet daily. Calcium '650mg'$  , Vit d mcg     Cranberry (ELLURA) 200 MG CAPS      diclofenac Sodium (VOLTAREN) 1 % GEL Apply 2 g topically 4 (four)  times daily. 4 times a day; 2 grams 2-4 times daily     EPINEPHrine 0.3 mg/0.3 mL IJ SOAJ injection AS DIRECTED AS NEEDED FOR INJECTION 30     Estradiol (YUVAFEM) 10 MCG TABS vaginal tablet Place vaginally. Vaginal 2 times weekly for the next 3 weeks; increase to 3x/week     fexofenadine (ALLEGRA) 180 MG tablet Take 180 mg by mouth every morning.     Gabapentin Enacarbil ER (HORIZANT) 300 MG TBCR Take by mouth. Oral, 2 times a day     ipratropium (ATROVENT) 0.03 % nasal spray Place 2 sprays into the nose 3 (three) times daily.     latanoprost (XALATAN) 0.005 % ophthalmic solution      lidocaine (LMX) 4 % cream Apply 1 application topically as needed.     MAGNESIUM GLYCINATE PLUS PO Take 400 mg by mouth.     Omega-3 Fatty Acids (FISH OIL) 1200 MG CAPS Take 1,200 mg by mouth daily. Patient takes 2 gel caps daily     timolol (BETIMOL) 0.5 % ophthalmic solution 1 drop 2 (two) times daily. One drop both eyes every morning     No current facility-administered medications on file prior to visit.    Past Surgical History:  Procedure Laterality Date   APPENDECTOMY     COLONOSCOPY  04/2009   Int & Ext  Hemorrhoids, L sided diverticula.  [facility].  Repeat 5 years   ESOPHAGOGASTRODUODENOSCOPY  04/2009   tiny HH, minimal antritis, gastric polyps. [facility].   EYE SURGERY     MYOMECTOMY     [facility].   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  Tobacco Use   Smoking status: Never   Smokeless tobacco: Never  Vaping Use   Vaping Use: Never used  Substance and Sexual Activity   Alcohol use:  Not Currently   Drug use: No   Sexual activity: Yes    Partners: Male    Birth control/protection: Post-menopausal  Other Topics Concern   Not on file  Social History Narrative   Marital status:  Married x 48 years; happily married      Children:  2 children; 1 grandchild Berline Lopes)      Lives: with husband.      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      Tobacco: never      Alcohol: none      Drugs: none      Exercise: daily.  3-4 miles walking daily; water aerobics three days per week warm water.  1-5-2.0 hours per day. Walks atleast 10,000 steps per day; warm water exercises three days per week.      Advanced Directives:  +Living Will; DNR       ADLs: independent with all ADLs; no assistant devices for ambulation.   Social Determinants of Health   Financial Resource Strain: Not on file  Food Insecurity: Not on file  Transportation Needs: Not on file  Physical Activity: Not on file  Stress: Not on file  Social Connections: Not on file  Intimate Partner Violence: Not on file    Family History  Problem Relation Age of Onset   Arthritis Mother        mild   Hypertension Mother    Dementia Mother    Cancer Mother 83       breast cancer   Hyperlipidemia Mother    Heart disease Mother    COPD Father    Stroke Father    Heart disease Father    Seizures Brother    Cancer Brother    Thyroid disease Brother    Osteoporosis Neg Hx     There were no vitals taken for this visit.  Review of Systems: See HPI above.     Objective:  Physical Exam:  Gen: awake, alert, NAD, comfortable in exam room Pulm: breathing unlabored  Foot/ankle: No obvious swelling.  There is no tenderness to palpation.  Full ROM of plantarflexion and dorsiflexion bilaterally without pain.  Full strength with plantarflexion and dorsiflexion.  2+ PT and DP pulses bilaterally.  Hip: Tenderness to palpation at the buttock at the level of  the piriformis bilaterally.  Full ROM with internal and external rotation without pain.  Full strength with resisted abduction bilateral.  Back: Negative straight leg raise bilaterally.   Assessment &  Plan:  1. Posterior tibialis tendinopathy, bilateral Chronic bilateral foot pain affecting the medial aspect of the foot radiating up the ankle and shin suspect is likely posterior tibialis tendinopathy based on the distribution of the pain.  Description of pain does not sound quite like neuropathic but could be considered. - home exercises provided - continue arch support - f/u in 6 weeks, consider PT if not improving  Zola Button, MD [place], PGY-3

## 2022-09-04 NOTE — Patient Instructions (Signed)
Your exam and ultrasound are reassuring. You have posterior tibialis tendinopathy. On the left we can see 3 calcifications within the tendon, 1 on the right. Continue with the arch support as you have been. Do home exercises as directed every other day.  Continue your piriformis stretching and add the hip side raises 3 sets of 10 every other day. Icing, tylenol only if needed. Follow up with me in 6 weeks.  If after 2-3 weeks you're not improving as much as expected call me and would put in an order for physical therapy.

## 2022-10-16 ENCOUNTER — Ambulatory Visit: Payer: Medicare PPO | Admitting: Family Medicine

## 2023-06-24 NOTE — Therapy (Signed)
OUTPATIENT PHYSICAL THERAPY FEMALE PELVIC EVALUATION   Patient Name: Kristin Tapia MRN: [id number] DOB:1945/07/01, 77 y.o., female Today's Date: 06/25/2023  END OF SESSION:  PT End of Session - 06/25/23 1357     Visit Number 1    Authorization Type [employer]IC[employer]    Authorization Time Period 8 weeks    Authorization - Visit Number 1    Progress Note Due on Visit 10    PT Start Time 0845    PT Stop Time 1000    PT Time Calculation (min) 75 min    Activity Tolerance Patient tolerated treatment well    Behavior During Therapy [facility] for tasks assessed/performed             Past Medical History:  Diagnosis Date   Abnormality of gait 03/11/2009   Allergy    immunotherapy/[facility] [place].   Anxiety    Arthritis    Asthma    childhood   Cataract    Cataract    Phreesia 12/07/2019   Cavus deformity of foot, acquired 03/11/2009   Cystocele    Detached retina    GERD (gastroesophageal reflux disease)    Hip pain 01/17/2013   Metatarsalgia 03/11/2009   Osteoporosis    Postmenopausal atrophic vaginitis    Rectocele    Restless leg syndrome    L leg; s/p consultation by [facility]/Neurology.   Sciatica of left side 01/17/2010   Spinal stenosis    Squamous cell carcinoma of skin 01/25/2015   L shoulder   Thyroid disease    Past Surgical History:  Procedure Laterality Date   APPENDECTOMY     COLONOSCOPY  04/2009   Int & Ext Hemorrhoids, L sided diverticula.  [facility].  Repeat 5 years   ESOPHAGOGASTRODUODENOSCOPY  04/2009   tiny HH, minimal antritis, gastric polyps. [facility].   EYE SURGERY     MYOMECTOMY     [facility].   SPINE SURGERY N/A    Phreesia 12/07/2019   TONSILLECTOMY     Patient Active Problem List   Diagnosis Date Noted   Plantar fasciitis of left foot 02/22/2022   Spinal stenosis at L4-L5 level 01/09/2020   Increased pressure in the eye, right 01/09/2020   S/P cervical spinal fusion 12/09/2019   Cervical radiculopathy 10/20/2019   Metatarsalgia of right  foot 09/03/2018   Degenerative lumbar disc 11/02/2016   Psychophysiological insomnia 06/26/2016   Atrophic vaginitis 05/04/2016   Vaginal moniliasis 05/04/2016   Seasonal allergic rhinitis due to pollen 05/09/2015   Asthma, mild intermittent 05/09/2015   Tendinopathy of right rotator cuff 12/10/2014   Primary osteoarthritis involving multiple joints 05/23/2014   Osteopenia 05/23/2014   Right tennis elbow 03/05/2014   Pain in joint, ankle and foot 05/06/2013   Left hip pain 01/17/2013   Sciatica of left side 01/17/2010   Enthesopathy of ankle and tarsus 03/11/2009   Cavus deformity of foot 03/11/2009    PCP: Morrell Riddle, PA-C   REFERRING PROVIDER: Morrell Riddle, PA-C  REFERRING DIAG: N39.41 (ICD-10-CM) - Urge incontinence  THERAPY DIAG:  Muscle weakness (generalized)  Other muscle spasm  Unspecified lack of coordination  Rationale for Evaluation and Treatment: Rehabilitation  ONSET DATE: 10 years ago, maybe longer  SUBJECTIVE:  SUBJECTIVE STATEMENT: Pt reports that she has had more leakage. Getting worse, coincided with vaginal irritation that started in the summer 2024. Has had blood in her urine for a long time. Diagnosed with vaginal atrophy. Reports that things are very dry, has been doing estradiol since November. Somewhat helpful, but still has had irritation She had pelvic PT a while ago- about 10 years. Has a gyno and urogyne appt coming up for vaginal irritation. Still doing exercises from previous PT, they used to be more helpful  Leakage often when she is sitting down on the toilet, trying to get her pants down, when she sees the door to the bathroom, door to her house.  Fluid intake: Yes: 2 quarts water/ day, some black tea   Does exercise classes 6 times/ week,  walks PAIN:  Are you having pain? Yes NPRS scale: 3/10 Pain location: Left and Vaginal  Pain type: burning Pain description: intermittent   Aggravating factors: wiping, soap Relieving factors: no  PRECAUTIONS: None  RED FLAGS: None   WEIGHT BEARING RESTRICTIONS: No  FALLS:  Has patient fallen in last 6 months? No  LIVING ENVIRONMENT: Lives with: lives with their spouse Lives in: House/apartment Stairs: No Has following equipment at home: None  OCCUPATION: retired  PLOF: Independent  PATIENT GOALS: would like not to leak so much  PERTINENT HISTORY:  no Sexual abuse: No  BOWEL MOVEMENT: Pain with bowel movement: No Type of bowel movement:Type (Bristol Stool Scale) 2-5 and Strain Yes Fully empty rectum: no Leakage: Yes:   one time, very embarrassing Pads: no Fiber supplement: No  URINATION: Pain with urination: No Fully empty bladder: Yes:   Stream:  variable Urgency: Yes: has triggers Frequency: variable, gets up at night a lot, urge to pee does not typically wake her up Leakage: Urge to void and Walking to the bathroom Pads: Yes: knix and liner  INTERCOURSE: Pain with intercourse: Initial Penetration Ability to have vaginal penetration:  Yes: with lots of lubricant and care Climax: yes with a vibrator Marinoff Scale: 1/3  PREGNANCY: Vaginal deliveries 2 Tearing Yes:   - first baby- cam fast and was a big baby C-section deliveries 0 Currently pregnant No  PROLAPSE: Yes- but does not remember, maybe rectocele   OBJECTIVE:  Note: Objective measures were completed at Evaluation unless otherwise noted.  COGNITION: Overall cognitive status: Within functional limits for tasks assessed     SENSATION: Light touch: Appears intact Proprioception: Appears intact  LUMBAR SPECIAL TESTS:  Slump test: Negative   POSTURE: rounded shoulders, forward head, decreased lumbar lordosis, and flexed trunk   PELVIC ALIGNMENT:  even  LUMBARAROM/PROM:  A/PROM A/PROM  eval  Flexion limited  Extension   Right lateral flexion   Left lateral flexion   Right rotation   Left rotation    (Blank rows = not tested)  LOWER EXTREMITY ROM: slightly limited AROM throughout hips and knees LOWER EXTREMITY MMT: grossly WFL PALPATION:   General  WFL                External Perineal Exam no tenderness                             Internal Pelvic Floor tight and tender left bulbo, some tenderness on right. Pt reported burning, tension improved with STM and knee fallout   Patient confirms identification and approves PT to assess internal pelvic floor and treatment Yes  PELVIC MMT:  MMT eval  Vaginal 3/5  Internal Anal Sphincter   External Anal Sphincter   Puborectalis   Diastasis Recti no  (Blank rows = not tested)        TONE: Low anterior and posterior vaginal wall   PROLAPSE: Rectocele and cyctocele in hooklying  TODAY'S TREATMENT:                                                                                                                              DATE: 06/25/2023  EVAL see below  Manual- STM first layer pelvic floor muscles with left knee fallout    Neuro reed- quadruped transverse abdominis breath 10 reps                                       Diaphragmatic breathing                                         Ball press with transverse abdominis breath seated    Therapeutic activities- urge drill  PATIENT EDUCATION:  Education details: relevant anatomy, exam findings, HEP, manual release first layer of PF for HEP Person educated: Patient Education method: Programmer, multimedia, Demonstration, Verbal cues, and Handouts Education comprehension: verbalized understanding and needs further education  HOME EXERCISE PROGRAM: [id number]  ASSESSMENT:  CLINICAL IMPRESSION: Patient is a 77 y.o. F who was seen today for physical therapy evaluation and treatment for urge incontinence. She demonstrates  tight  and tender layer one pelvic floor, more on left, some anterior and posterior vaginal wall laxity, decreased strength of pelvic floor and some restrictions throughout abdomen. She will benefit from PT to reduce urge incontinence, leaking and pain with intercourse. Pt with a history of some fecal incontinence as well.  OBJECTIVE IMPAIRMENTS: decreased ROM, decreased strength, increased fascial restrictions, increased muscle spasms, impaired tone, and pain.   ACTIVITY LIMITATIONS: continence and toileting  PARTICIPATION LIMITATIONS: community activity  PERSONAL FACTORS: Age, Behavior pattern, and Time since onset of injury/illness/exacerbation are also affecting patient's functional outcome.   REHAB POTENTIAL: Good  CLINICAL DECISION MAKING: Stable/uncomplicated  EVALUATION COMPLEXITY: Low   GOALS: Goals reviewed with patient? Yes  SHORT TERM GOALS: Target date: 07/23/2023    Pt will be I with initial HEP Baseline: Goal status: INITIAL  2.  Pt will report reduced Urge incontinence by 50%  Baseline:  Goal status: INITIAL  3.  Pt will be I with urge drill Baseline:  Goal status: INITIAL  4.  Pt will teach back urge suppression strategies and bladder irritants Baseline:  Goal status: INITIAL  5.  Pt will be I with manual pelvic floor release Baseline:  Goal status: INITIAL   LONG TERM GOALS: Target date: 08/20/2023    Pt will report 0 leaking Baseline:  Goal status:  INITIAL  2.  Pt will report 0/10 pain in her pelvic floor Baseline:  Goal status: INITIAL  3.  Pt will be I with her advanced HEP Baseline:  Goal status: INITIAL  4.  Pt will dem improved pelvic floor strength to 4/5 at least Baseline:  Goal status: INITIAL    PLAN:  PT FREQUENCY: 1-2x/week  PT DURATION:  12 sessions  PLANNED INTERVENTIONS: 97110-Therapeutic exercises, 97530- Therapeutic activity, 97112- Neuromuscular re-education, 97535- Self Care, 75643- Manual therapy, [phone]- Electrical  stimulation (manual), Dry Needling, Joint mobilization, Joint manipulation, Spinal manipulation, Spinal mobilization, and Biofeedback  PLAN FOR NEXT SESSION: continue exercises   Miliyah Luper, PT 06/25/23 4:12 PM

## 2023-06-25 ENCOUNTER — Other Ambulatory Visit: Payer: Self-pay

## 2023-06-25 ENCOUNTER — Ambulatory Visit: Payer: Medicare PPO | Attending: Physician Assistant | Admitting: Physical Therapy

## 2023-06-25 ENCOUNTER — Encounter: Payer: Self-pay | Admitting: Physical Therapy

## 2023-06-25 DIAGNOSIS — M62838 Other muscle spasm: Secondary | ICD-10-CM | POA: Diagnosis present

## 2023-06-25 DIAGNOSIS — M6281 Muscle weakness (generalized): Secondary | ICD-10-CM | POA: Insufficient documentation

## 2023-06-25 DIAGNOSIS — R279 Unspecified lack of coordination: Secondary | ICD-10-CM | POA: Insufficient documentation

## 2023-07-02 ENCOUNTER — Ambulatory Visit: Payer: Medicare PPO | Admitting: Obstetrics and Gynecology

## 2023-07-02 ENCOUNTER — Encounter: Payer: Self-pay | Admitting: Obstetrics and Gynecology

## 2023-07-02 VITALS — BP 110/72 | HR 71 | Ht 62.25 in | Wt 135.0 lb

## 2023-07-02 DIAGNOSIS — N898 Other specified noninflammatory disorders of vagina: Secondary | ICD-10-CM | POA: Diagnosis not present

## 2023-07-02 DIAGNOSIS — M810 Age-related osteoporosis without current pathological fracture: Secondary | ICD-10-CM | POA: Diagnosis not present

## 2023-07-02 DIAGNOSIS — M8000XA Age-related osteoporosis with current pathological fracture, unspecified site, initial encounter for fracture: Secondary | ICD-10-CM

## 2023-07-02 LAB — WET PREP FOR TRICH, YEAST, CLUE

## 2023-07-02 MED ORDER — TERCONAZOLE 0.8 % VA CREA: 1.0000 | TOPICAL_CREAM | Freq: Every day | VAGINAL | 3 refills | Status: AC

## 2023-07-02 MED ORDER — ROMOSOZUMAB-AQQG 105 MG/1.17ML ~~LOC~~ SOSY
210.0000 mg | PREFILLED_SYRINGE | Freq: Once | SUBCUTANEOUS | Status: AC
  Administered 2023-10-01: 210 mg via SUBCUTANEOUS

## 2023-07-02 NOTE — Addendum Note (Signed)
 Addended by: Earley Favor on: 07/02/2023 03:36 PM   Modules accepted: Orders

## 2023-07-02 NOTE — Progress Notes (Signed)
 78 y.o. y.o. female here for vaginal burning and itching on left side of the labia.  Recently she was treated for a UTI. She has increased her vaginal estrogen cream to twice weekly and it has helped but has not completely taken the discomfort away.  No LMP recorded. Patient is postmenopausal.   Reports she cannot take ER gabapentin  with diflucan  Also, needs referral for repeat dxa scan. Currently on prolia but no improvement and she would be interested in doing evenity  for a year and has been reading on this. [facility] has done prior bone scans.  She has paper records of them.  She is scheduled to see urogyn in March for urinary incontinence and prolapse. Body mass index is 24.49 kg/m.     02/06/2020    2:46 PM 11/28/2019   10:06 AM 10/16/2019    3:50 PM  Depression screen PHQ 2/9  Decreased Interest 0 0   Down, Depressed, Hopeless 2 0 0  PHQ - 2 Score 2 0 0  Altered sleeping 3    Tired, decreased energy 2    Change in appetite 0    Feeling bad or failure about yourself  0    Trouble concentrating 0    Moving slowly or fidgety/restless 0    Suicidal thoughts 0    PHQ-9 Score 7    Difficult doing work/chores Somewhat difficult      Blood pressure 110/72, pulse 71, height 5' 2.25 (1.581 m), weight 135 lb (61.2 kg), SpO2 97%.  No results found for: DIAGPAP, HPVHIGH, ADEQPAP  GYN HISTORY: No results found for: DIAGPAP, HPVHIGH, ADEQPAP  OB History  Gravida Para Term Preterm AB Living  2 2    2   SAB IAB Ectopic Multiple Live Births          # Outcome Date GA Lbr Len/2nd Weight Sex Type Anes PTL Lv  2 Para           1 Para             Past Medical History:  Diagnosis Date   Abnormality of gait 03/11/2009   Allergy    immunotherapy/[facility] [place].   Anxiety    Arthritis    Asthma    childhood   Cataract    Cataract    [facility] 12/07/2019   Cavus deformity of foot, acquired 03/11/2009   Cystocele    Detached retina    GERD (gastroesophageal reflux  disease)    Hip pain 01/17/2013   Metatarsalgia 03/11/2009   Osteoporosis    Postmenopausal atrophic vaginitis    Rectocele    Restless leg syndrome    L leg; s/p consultation by D[facility]meier/Neurology.   Sciatica of left side 01/17/2010   Spinal stenosis    Squamous cell carcinoma of skin 01/25/2015   L shoulder   Thyroid disease     Past Surgical History:  Procedure Laterality Date   APPENDECTOMY     COLONOSCOPY  04/2009   Int & Ext Hemorrhoids, L sided diverticula.  [facility].  Repeat 5 years   ESOPHAGOGASTRODUODENOSCOPY  04/2009   tiny HH, minimal antritis, gastric polyps. [facility].   EYE SURGERY     MYOMECTOMY     [place].   SPINE SURGERY N/A    [facility] 12/07/2019   TONSILLECTOMY      Current Outpatient Medications on File Prior to Visit  Medication Sig Dispense Refill   ACETAMINOPHEN PO Take by mouth daily.     ALPRAZolam  (XANAX ) 0.5 MG tablet Take  0.5 tablets (0.25 mg total) by mouth at bedtime as needed for anxiety. 30 tablet 1   Calcium-Magnesium-Vitamin D  (CALCIUM 1200+D3 PO) 1 tablet daily. Calcium 650mg  , Vit d mcg     Cholecalciferol (VITAMIN D3 PO) Take 25 mcg by mouth.     Cranberry (ELLURA) 200 MG CAPS      denosumab (PROLIA) 60 MG/ML SOSY injection Inject into the skin.     diclofenac  Sodium (VOLTAREN ) 1 % GEL Apply 2 g topically 4 (four) times daily. 4 times a day; 2 grams 2-4 times daily     estradiol  (ESTRACE ) 0.1 MG/GM vaginal cream Place vaginally.     Estradiol  (YUVAFEM ) 10 MCG TABS vaginal tablet Place vaginally. Vaginal 2 times weekly for the next 3 weeks; increase to 3x/week     fexofenadine (ALLEGRA) 180 MG tablet Take 180 mg by mouth every morning.     Gabapentin  Enacarbil ER (HORIZANT) 300 MG TBCR Take by mouth. Takes 1/2 in the am & 1 tablet in the pm     ipratropium (ATROVENT) 0.03 % nasal spray Place 2 sprays into the nose 3 (three) times daily.     latanoprost (XALATAN) 0.005 % ophthalmic solution      lidocaine  (LMX) 4 % cream Apply 1 application  topically as needed.     MAGNESIUM GLYCINATE PLUS PO Take 400 mg by mouth.     Menaquinone-7 (VITAMIN K2) 100 MCG CAPS      Omega-3 Fatty Acids (FISH OIL) 1200 MG CAPS Take 1,200 mg by mouth daily. Patient takes 2 gel caps daily     timolol (BETIMOL) 0.5 % ophthalmic solution 1 drop 2 (two) times daily. One drop both eyes every morning     EPINEPHrine 0.3 mg/0.3 mL IJ SOAJ injection AS DIRECTED AS NEEDED FOR INJECTION 30 (Patient not taking: Reported on 07/02/2023)     famotidine  (PEPCID ) 20 MG tablet Take by mouth as needed.     No current facility-administered medications on file prior to visit.    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  Tobacco Use   Smoking status: Never   Smokeless tobacco: Never  Vaping Use   Vaping status: Never Used  Substance and Sexual Activity   Alcohol use: Not Currently   Drug use: No   Sexual activity: Yes    Partners: Male    Birth control/protection: Post-menopausal  Other Topics Concern   Not on file  Social History Narrative   Marital status:  Married x 48 years; happily married      Children:  2 children; 1 grandchild Carletta)      Lives: with husband.      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      Tobacco: never      Alcohol: none      Drugs: none      Exercise: daily.  3-4 miles walking daily; water aerobics three days per week warm water.  1-5-2.0 hours per day. Walks atleast 10,000 steps per day; warm water exercises three days per week.      Advanced Directives:  +Living Will; DNR  ADLs: independent with all ADLs; no assistant devices for ambulation.   Social Drivers of Research Scientist (physical Sciences) Strain: Low Risk  (03/14/2023)   [facility]   Overall Financial Resource Strain (CARDIA)    Difficulty of Paying Living Expenses: Not hard at all  Food Insecurity: No Food Insecurity (03/14/2023)   [facility]   Hunger Vital Sign    Worried About Running Out of Food in the Last Year: Never true    Ran Out of Food in the Last Year: Never true  Transportation Needs: No Transportation Needs (03/14/2023)   Received from [facility] - Transportation    Lack of Transportation (Medical): No    Lack of Transportation (Non-Medical): No  Physical Activity: Sufficiently Active (03/14/2023)   Received from [facility]   Exercise Vital Sign    Days of Exercise per Week: 7 days    Minutes of Exercise per Session: 70 min  Stress: No Stress Concern Present (03/14/2023)   Received from [facility] - Occupational Stress Questionnaire    Feeling of Stress : Only a little  Social Connections: Socially Integrated (03/14/2023)   Received from [facility]   Social Network    How would you rate your social network (family, work, friends)?: Good participation with social networks  Intimate Partner Violence: Not At Risk (03/14/2023)   Received from [facility]   HITS    Over the last 12 months how often did your partner physically hurt you?: Never    Over the last 12 months how often did your partner insult you or talk down to you?: Sometimes    Over the last 12 months how often did your partner threaten you with physical harm?: Never    Over the last 12 months how often did your partner scream or curse at you?: Never    Family History  Problem Relation Age of Onset   Arthritis Mother        mild   Hypertension Mother    Dementia Mother    Cancer Mother 52       breast cancer   Hyperlipidemia Mother    Heart disease Mother    COPD Father    Stroke Father    Heart disease Father    Seizures Brother     Cancer Brother    Thyroid disease Brother    Osteoporosis Neg Hx      Allergies  Allergen Reactions   Sulfonamide Derivatives Anaphylaxis      Patient's last menstrual period was No LMP recorded. Patient is postmenopausal..            Review of Systems Alls systems reviewed and are negative.     Physical Exam Constitutional:      Appearance: Normal appearance.  Genitourinary:     Genitourinary Comments: Fusion of labia majora b/l No lesions      Labial fusion present.     Anterior and apical vaginal prolapse present. Musculoskeletal:     Cervical back: Normal range of motion.  Neurological:     Mental Status: She is alert and oriented to person, place, and time.  Psychiatric:        Mood and Affect: Mood normal.        Behavior: Behavior normal.  Vitals and nursing note reviewed.       A:         Vaginal irritation  P:         Osteoporosis: counseled on evenity . To place PA. Discussed she would need to return to prolia after finishing the year of evenity . Referral for repeat dxa scan placed.  Continue vit d and calcium. Wet mount with yeast: Vaginal terazol sent in with refill Increase to 3 times a week for the vaginal estrogen.  Also discussed vaginal coconut oil cubes at bedtime for atrophic vaginitis. RTC in 2 weeks for follow-up  No follow-ups on file.  Almarie MARLA Carpen

## 2023-07-03 ENCOUNTER — Telehealth: Payer: Self-pay | Admitting: *Deleted

## 2023-07-03 DIAGNOSIS — M810 Age-related osteoporosis without current pathological fracture: Secondary | ICD-10-CM

## 2023-07-03 NOTE — Telephone Encounter (Signed)
 Insurance information submitted to [employer] portal. Will await summary of benefits for Evenity.

## 2023-07-03 NOTE — Telephone Encounter (Signed)
 Routing to [facility] to f/u with patient.

## 2023-07-03 NOTE — Telephone Encounter (Signed)
-----   Message from Kristin Tapia sent at 07/02/2023  3:20 PM EST ----- Worsening osteoporosis on prolia.  She would like to do a year of the evenity  and return to prolia. She will bring her records from [facility].  She needs any updated dxa. She is doing prolia with PCP, but maybe we can continue here after the evenity  treatment. Can we check with her insurance. Thank you Dr. Carpen

## 2023-07-04 NOTE — Telephone Encounter (Signed)
 PA for Evenity submitted to insurance plan via cover my meds portal. Key: [id number]. Will await response from insurance company.

## 2023-07-04 NOTE — Telephone Encounter (Signed)
 Kristin Tapia -please f/u with patient. See MyChart message.

## 2023-07-04 NOTE — Telephone Encounter (Signed)
 Call to patient. Patient states that she has spoken with her insurance company and will need a letter faxed to them stating why it is medically necessary that she have a BMD scan early. Last BMD was 04/2022 at [facility]. Patient advised insurance information submitted to [employer] portal for benefits verification and still waiting on a response. Patient asking if she can postpone 2 week medication recheck and wait to schedule with Evenity  injection? RN advised would review with Dr. Glennon.  Patient also asking if she should keep Prolia injection appointment on 07-13-23 with PCP or should she cancel? RN advised could not guarantee that benefits and necessary authorization requirements would submitted and approved by insurance by 07-12-22, but would review with Dr. Glennon and return call with recommendations. Patient agreeable. '  Routing to provider for review.

## 2023-07-11 ENCOUNTER — Encounter: Payer: Self-pay | Admitting: Obstetrics and Gynecology

## 2023-07-11 NOTE — Telephone Encounter (Signed)
 Kristin Tapia "Kristin Tapia"  P [facility] (supporting You)2 days ago    Today I finally received the report from my 2022 bone density exam.  I have the 2021 report (diagnosis Osteoporosis--started on Tymlos Dec 2021), the 2022 report (great improvement--diagnosis back to Osteopenia) , and 2023 (still Osteopenia,  but not as good as 2022).  I can bring these reports when I come on Jan 20 or send them sooner, whichever she prefers.  See above message from patient. Please advise on if patient needs to update BMD?   [employer] approved Evenity  through 06/25/24.

## 2023-07-12 NOTE — Telephone Encounter (Signed)
Deductible:  none  OOP MAX: none  OV: 07/02/23   Calcium:            Date: scheduled for 07-16-23  eGFR:     Date:   Upcoming dental procedures: No   Hx of Kidney Disease: No  Hx of heart attack or stroke in the last year: No  Last Bone Density Scan: 2023   Is Prior Authorization needed: yes, approved. Authorization #: [id number] through 06/25/2024.   Pt estimated Cost: $40

## 2023-07-12 NOTE — Telephone Encounter (Signed)
Call to patient. Summary of benefits reviewed with patient and she verbalized understanding. Patient has appointment on 07/16/23 for 2 weeks follow up- will do labs prior to [place] at appointment. Patient asking if she should keep her prolia injection scheduled for tomorrow with her PCP. RN reviewed with Kristin Tapia, who states patient should cancel appointment or wait 31 days before receiving Evenity after prolia for insurance purposes. Patient will contact PCP to cancel appointment in hopes of getting Evenity in the next week pending labs return normal.   Routing to provider as FYI.   Future order for CMP placed.

## 2023-07-16 ENCOUNTER — Ambulatory Visit: Payer: Medicare PPO | Admitting: Obstetrics and Gynecology

## 2023-07-16 ENCOUNTER — Encounter: Payer: Self-pay | Admitting: Obstetrics and Gynecology

## 2023-07-16 VITALS — BP 110/82 | HR 64

## 2023-07-16 DIAGNOSIS — E2839 Other primary ovarian failure: Secondary | ICD-10-CM | POA: Diagnosis not present

## 2023-07-16 DIAGNOSIS — M8000XK Age-related osteoporosis with current pathological fracture, unspecified site, subsequent encounter for fracture with nonunion: Secondary | ICD-10-CM

## 2023-07-16 DIAGNOSIS — N952 Postmenopausal atrophic vaginitis: Secondary | ICD-10-CM

## 2023-07-16 DIAGNOSIS — M810 Age-related osteoporosis without current pathological fracture: Secondary | ICD-10-CM | POA: Diagnosis not present

## 2023-07-16 DIAGNOSIS — N898 Other specified noninflammatory disorders of vagina: Secondary | ICD-10-CM

## 2023-07-16 LAB — WET PREP FOR TRICH, YEAST, CLUE

## 2023-07-16 NOTE — Progress Notes (Signed)
   Acute Office Visit  Subjective:    Patient ID: Kristin Tapia, female    DOB: 11/15/1945, 79 y.o.   MRN: [id number]   HPI 78 y.o. presents today for Follow-up (2 week f/u on meds and labs for Evenity/Increased vag estrogen from 2-3x weekly for vaginal irritation./Pt reports her only challenge with all the vaginal medications is that she feels as if most of vaginal meds are coming out, even if lying down./Pt reports some relief but not total. Pt reports some yellow d/c and intermittent odor (not sure if urine). ) . Finished treated for yeast and feels somewhat better. She wants to ensure treatment worked since she is having some yellow d/c No LMP recorded. Patient is postmenopausal.    Review of Systems     Objective:    OBGyn Exam  BP 110/82   Pulse 64   SpO2 99%  Wt Readings from Last 3 Encounters:  07/02/23 135 lb (61.2 kg)  09/04/22 130 lb (59 kg)  02/22/22 128 lb (58.1 kg)      SVE: vaginal atrophy improving, no abnormal discharge no VB  Patient informed chaperone available to be present for breast and/or pelvic exam. Patient has requested no chaperone to be present. Patient has been advised what will be completed during breast and pelvic exam.   Assessment & Plan:  Pelvic PT is helping. Encouraged to continue this with just a pea size of estrogen cream and if she likes coconut oil at night- to rub into the tissue with finger. Do not use applicator. Wet mount negative.  Labs drawn today to start evenity. Dxa scan to be completed and medical necessity form to be completed as it is indicated for 2 year follow-up from last and to show improvement with evenity treatment.  Patient is eager to start treatment.  She will resume prolia after that. Dr. Arman Filter Tresa Res

## 2023-07-17 LAB — BASIC METABOLIC PANEL WITH GFR
BUN: 18 mg/dL (ref 7–25)
CO2: 28 mmol/L (ref 20–32)
Calcium: 9.4 mg/dL (ref 8.6–10.4)
Chloride: 100 mmol/L (ref 98–110)
Creat: 0.85 mg/dL (ref 0.60–1.00)
Glucose, Bld: 85 mg/dL (ref 65–99)
Potassium: 4.4 mmol/L (ref 3.5–5.3)
Sodium: 138 mmol/L (ref 135–146)
eGFR: 71 mL/min/{1.73_m2} (ref >=60)

## 2023-07-18 ENCOUNTER — Encounter: Payer: Self-pay | Admitting: Obstetrics and Gynecology

## 2023-07-18 ENCOUNTER — Encounter: Payer: Self-pay | Admitting: *Deleted

## 2023-07-18 NOTE — Telephone Encounter (Signed)
Letter of medical necessity written for BMD and taken to Dr. Bonney Roussel desk to review and sign.

## 2023-07-18 NOTE — Telephone Encounter (Signed)
-----   Message from Earley Favor sent at 07/16/2023  2:49 PM EST ----- Labs were drawn today.  She is ready to start evenity after review of labs. Needs a medical necessity for the dxa scan Repeat in 2 years from last one and to get baseline prior to starting the evenity to show progress.  Last scan in 2023 Showed improvement on medication from 21 to 22 bone scan and then drop in her density off medication from 22 to 23.  Thank you Dr. Karma Greaser

## 2023-07-19 ENCOUNTER — Ambulatory Visit: Payer: Medicare PPO | Admitting: Physical Therapy

## 2023-07-20 ENCOUNTER — Encounter: Payer: Self-pay | Admitting: Physical Therapy

## 2023-07-20 ENCOUNTER — Ambulatory Visit: Payer: Medicare PPO | Attending: Physician Assistant | Admitting: Physical Therapy

## 2023-07-20 DIAGNOSIS — M6281 Muscle weakness (generalized): Secondary | ICD-10-CM | POA: Insufficient documentation

## 2023-07-20 DIAGNOSIS — M62838 Other muscle spasm: Secondary | ICD-10-CM | POA: Diagnosis present

## 2023-07-20 DIAGNOSIS — R279 Unspecified lack of coordination: Secondary | ICD-10-CM | POA: Diagnosis present

## 2023-07-20 NOTE — Therapy (Signed)
OUTPATIENT PHYSICAL THERAPY FEMALE PELVIC TREATMENT   Patient Name: Kristin Tapia MRN: [id number] DOB:Mar 24, 1946, 78 y.o., female Today's Date: 07/20/2023  END OF SESSION:  PT End of Session - 07/20/23 1146     Visit Number 2    Authorization Type [employer]IC[employer]    Authorization Time Period 8 weeks    Authorization - Visit Number 2    PT Start Time 0930    PT Stop Time 1015    PT Time Calculation (min) 45 min    Activity Tolerance Patient tolerated treatment well    Behavior During Therapy [facility] for tasks assessed/performed;Anxious              Past Medical History:  Diagnosis Date   Abnormality of gait 03/11/2009   Allergy    immunotherapy/Sharma Gilbert.   Anxiety    Arthritis    Asthma    childhood   Cataract    Cataract    Phreesia 12/07/2019   Cavus deformity of foot, acquired 03/11/2009   Cystocele    Detached retina    GERD (gastroesophageal reflux disease)    Hip pain 01/17/2013   Metatarsalgia 03/11/2009   Osteoporosis    Postmenopausal atrophic vaginitis    Rectocele    Restless leg syndrome    L leg; s/p consultation by Dohmeier/Neurology.   Sciatica of left side 01/17/2010   Spinal stenosis    Squamous cell carcinoma of skin 01/25/2015   L shoulder   Thyroid disease    Past Surgical History:  Procedure Laterality Date   APPENDECTOMY     COLONOSCOPY  04/2009   Int & Ext Hemorrhoids, L sided diverticula.  Magod.  Repeat 5 years   ESOPHAGOGASTRODUODENOSCOPY  04/2009   tiny HH, minimal antritis, gastric polyps. Magod.   EYE SURGERY     MYOMECTOMY     Gottseigen.   SPINE SURGERY N/A    Phreesia 12/07/2019   TONSILLECTOMY     Patient Active Problem List   Diagnosis Date Noted   Plantar fasciitis of left foot 02/22/2022   Spinal stenosis at L4-L5 level 01/09/2020   Increased pressure in the eye, right 01/09/2020   S/P cervical spinal fusion 12/09/2019   Cervical radiculopathy 10/20/2019   Metatarsalgia of right foot 09/03/2018    Degenerative lumbar disc 11/02/2016   Psychophysiological insomnia 06/26/2016   Atrophic vaginitis 05/04/2016   Vaginal moniliasis 05/04/2016   Seasonal allergic rhinitis due to pollen 05/09/2015   Asthma, mild intermittent 05/09/2015   Tendinopathy of right rotator cuff 12/10/2014   Primary osteoarthritis involving multiple joints 05/23/2014   Osteopenia 05/23/2014   Right tennis elbow 03/05/2014   Pain in joint, ankle and foot 05/06/2013   Left hip pain 01/17/2013   Sciatica of left side 01/17/2010   Enthesopathy of ankle and tarsus 03/11/2009   Cavus deformity of foot 03/11/2009    PCP: Morrell Riddle, PA-C   REFERRING PROVIDER: Morrell Riddle, PA-C  REFERRING DIAG: N39.41 (ICD-10-CM) - Urge incontinence  THERAPY DIAG:  Muscle weakness (generalized)  Other muscle spasm  Unspecified lack of coordination  Rationale for Evaluation and Treatment: Rehabilitation  ONSET DATE: 10 years ago, maybe longer  SUBJECTIVE:  SUBJECTIVE STATEMENT: Pt coming back to PT after about 4 weeks.  Pt reports that she is doing better, she did especially well after her last appt. She was diagnosed with a yeast infection. She stopped using protective underwear. Still doing a liner now.  She recommended coconut oil. She is using estrogen cream.  Using uvafem tablet Was using antibiotic for UTI- got yeast infection after that.  Dr Karma Greaser- prescribed the coconut oil Her primary prescribed - the vaginal estrogen- 2 months ago Pt reports that she has not had leakage, has had reduced urgency  Has one cup of coffee or tea and the rest is water.  Urgency has returned in the last week, she is not sure why She has been doing her exercises She gets up at night about 5 times d/t low back pain Has been dealing with  her back pain for a long time Sleeping in a recliner d/t pain Has a lot of arthritis    Fluid intake: Yes: 2 quarts water/ day, some black tea   Does exercise classes 6 times/ week, walks PAIN:  Are you having pain? Yes NPRS scale: 3/10 Pain location: Left and Vaginal  Pain type: burning Pain description: intermittent   Aggravating factors: wiping, soap Relieving factors: no  PRECAUTIONS: None  RED FLAGS: None   WEIGHT BEARING RESTRICTIONS: No  FALLS:  Has patient fallen in last 6 months? No  LIVING ENVIRONMENT: Lives with: lives with their spouse Lives in: House/apartment Stairs: No Has following equipment at home: None  OCCUPATION: retired  PLOF: Independent  PATIENT GOALS: would like not to leak so much  PERTINENT HISTORY:  no Sexual abuse: No  BOWEL MOVEMENT: Pain with bowel movement: No Type of bowel movement:Type (Bristol Stool Scale) 2-5 and Strain Yes Fully empty rectum: no Leakage: Yes:   one time, very embarrassing Pads: no Fiber supplement: No  URINATION: Pain with urination: No Fully empty bladder: Yes:   Stream:  variable Urgency: Yes: has triggers Frequency: variable, gets up at night a lot, urge to pee does not typically wake her up Leakage: Urge to void and Walking to the bathroom Pads: Yes: knix and liner  INTERCOURSE: Pain with intercourse: Initial Penetration Ability to have vaginal penetration:  Yes: with lots of lubricant and care Climax: yes with a vibrator Marinoff Scale: 1/3  PREGNANCY: Vaginal deliveries 2 Tearing Yes:   - second baby- cam fast and was a big baby C-section deliveries 0 Currently pregnant No  PROLAPSE: Yes- but does not remember, maybe rectocele   OBJECTIVE:  Note: Objective measures were completed at Evaluation unless otherwise noted.  COGNITION: Overall cognitive status: Within functional limits for tasks assessed     SENSATION: Light touch: Appears intact Proprioception: Appears  intact  LUMBAR SPECIAL TESTS:  Slump test: Negative   POSTURE: rounded shoulders, forward head, decreased lumbar lordosis, and flexed trunk   PELVIC ALIGNMENT: even  LUMBARAROM/PROM:  A/PROM A/PROM  eval  Flexion limited  Extension   Right lateral flexion   Left lateral flexion   Right rotation   Left rotation    (Blank rows = not tested)  LOWER EXTREMITY ROM: slightly limited AROM throughout hips and knees LOWER EXTREMITY MMT: grossly WFL PALPATION:   General  WFL                External Perineal Exam no tenderness  Internal Pelvic Floor tight and tender left bulbo, some tenderness on right. Pt reported burning, tension improved with STM and knee fallout   Patient confirms identification and approves PT to assess internal pelvic floor and treatment Yes  PELVIC MMT:   MMT eval  Vaginal 3/5  Internal Anal Sphincter   External Anal Sphincter   Puborectalis   Diastasis Recti no  (Blank rows = not tested)        TONE: Low anterior and posterior vaginal wall   PROLAPSE: Rectocele and cyctocele in hooklying  TODAY'S TREATMENT:                                                                                                                              DATE: 06/25/2023  EVAL see below    Neuro reed- quadruped transverse abdominis breath 10 reps- difficult d/t wrist pain                                       Diaphragmatic breathing                                         Ball press with transverse abdominis breath seated Ball press supine Bridging     Therapeutic activities- reviewed HEP, recent symptoms, changes     PATIENT EDUCATION:  Education details: relevant anatomy, exam findings, HEP, manual release first layer of PF for HEP Person educated: Patient Education method: Programmer, multimedia, Demonstration, Verbal cues, and Handouts Education comprehension: verbalized understanding and needs further education  HOME EXERCISE  PROGRAM: [email or web address]  ASSESSMENT:  CLINICAL IMPRESSION: Pt  seems to be feeling better and doing better overall. Discussed monitoring vaginal irritation. She might see how she does without coconut oil just using estrogen cream and vaginal moisturizer.  Did fairly well with her exercises. Does yoga and swims in her retirement community. Has an upcoming appt with a urogyne.  Has sig low back pain that prevents her from sleeping well. Not planning surgery.   OBJECTIVE IMPAIRMENTS: decreased ROM, decreased strength, increased fascial restrictions, increased muscle spasms, impaired tone, and pain.   ACTIVITY LIMITATIONS: continence and toileting  PARTICIPATION LIMITATIONS: community activity  PERSONAL FACTORS: Age, Behavior pattern, and Time since onset of injury/illness/exacerbation are also affecting patient's functional outcome.   REHAB POTENTIAL: Good  CLINICAL DECISION MAKING: Stable/uncomplicated  EVALUATION COMPLEXITY: Low   GOALS: Goals reviewed with patient? Yes  SHORT TERM GOALS: Target date: 07/23/2023    Pt will be I with initial HEP Baseline: Goal status: INITIAL  2.  Pt will report reduced Urge incontinence by 50%  Baseline:  Goal status: INITIAL  3.  Pt will be I with urge drill Baseline:  Goal status: INITIAL  4.  Pt will teach back urge suppression strategies and bladder irritants  Baseline:  Goal status: INITIAL  5.  Pt will be I with manual pelvic floor release Baseline:  Goal status: INITIAL   LONG TERM GOALS: Target date: 08/20/2023    Pt will report 0 leaking Baseline:  Goal status: INITIAL  2.  Pt will report 0/10 pain in her pelvic floor Baseline:  Goal status: INITIAL  3.  Pt will be I with her advanced HEP Baseline:  Goal status: INITIAL  4.  Pt will dem improved pelvic floor strength to 4/5 at least Baseline:  Goal status: INITIAL    PLAN:  PT FREQUENCY: 1-2x/week  PT DURATION:  12 sessions  PLANNED INTERVENTIONS:  97110-Therapeutic exercises, 97530- Therapeutic activity, 97112- Neuromuscular re-education, 97535- Self Care, [id number]- Manual therapy, [phone]- Electrical stimulation (manual), Dry Needling, Joint mobilization, Joint manipulation, Spinal manipulation, Spinal mobilization, and Biofeedback  PLAN FOR NEXT SESSION: continue exercises   Jodey Burbano, PT 07/20/23 11:48 AM

## 2023-07-24 ENCOUNTER — Encounter: Payer: Self-pay | Admitting: Physical Therapy

## 2023-07-24 ENCOUNTER — Ambulatory Visit: Payer: Medicare PPO | Admitting: Physical Therapy

## 2023-07-24 DIAGNOSIS — M6281 Muscle weakness (generalized): Secondary | ICD-10-CM

## 2023-07-24 DIAGNOSIS — M62838 Other muscle spasm: Secondary | ICD-10-CM

## 2023-07-24 DIAGNOSIS — R279 Unspecified lack of coordination: Secondary | ICD-10-CM

## 2023-07-24 NOTE — Telephone Encounter (Signed)
The patient called back with the fax number it is [phone]

## 2023-07-24 NOTE — Telephone Encounter (Signed)
Letter of medical necessity for BMD to be done early faxed to [facility] at [phone]. Will await response and update patient.

## 2023-07-24 NOTE — Therapy (Signed)
OUTPATIENT PHYSICAL THERAPY FEMALE PELVIC TREATMENT   Patient Name: Kristin Tapia MRN: [id number] DOB:[birth year], 78 y.o., female Today's Date: 07/24/2023  END OF SESSION:  PT End of Session - 07/24/23 0904     Visit Number 3    Authorization Type [employer]IC[employer]    Authorization Time Period 8 weeks    Progress Note Due on Visit 10    PT Start Time 0845    PT Stop Time 0930    PT Time Calculation (min) 45 min    Activity Tolerance Patient tolerated treatment well    Behavior During Therapy [facility] for tasks assessed/performed;Anxious               Past Medical History:  Diagnosis Date   Abnormality of gait 03/11/2009   Allergy    immunotherapy/Sharma Sutherland.   Anxiety    Arthritis    Asthma    childhood   Cataract    Cataract    Phreesia 12/07/2019   Cavus deformity of foot, acquired 03/11/2009   Cystocele    Detached retina    GERD (gastroesophageal reflux disease)    Hip pain 01/17/2013   Metatarsalgia 03/11/2009   Osteoporosis    Postmenopausal atrophic vaginitis    Rectocele    Restless leg syndrome    L leg; s/p consultation by Dohmeier/Neurology.   Sciatica of left side 01/17/2010   Spinal stenosis    Squamous cell carcinoma of skin 01/25/2015   L shoulder   Thyroid disease    Past Surgical History:  Procedure Laterality Date   APPENDECTOMY     COLONOSCOPY  04/2009   Int & Ext Hemorrhoids, L sided diverticula.  Magod.  Repeat 5 years   ESOPHAGOGASTRODUODENOSCOPY  04/2009   tiny HH, minimal antritis, gastric polyps. Magod.   EYE SURGERY     MYOMECTOMY     [facility]ottseigen.   SPINE SURGERY N/A    [employer]reesia 12/07/2019   TONSILLECTOMY     Patient Active Problem List   Diagnosis Date Noted   Plantar fasciitis of left foot 02/22/2022   Spinal stenosis at L4-L5 level 01/09/2020   Increased pressure in the eye, right 01/09/2020   S/P cervical spinal fusion 12/09/2019   Cervical radiculopathy 10/20/2019   Metatarsalgia of right foot 09/03/2018    Degenerative lumbar disc 11/02/2016   Psychophysiological insomnia 06/26/2016   Atrophic vaginitis 05/04/2016   Vaginal moniliasis 05/04/2016   Seasonal allergic rhinitis due to pollen 05/09/2015   Asthma, mild intermittent 05/09/2015   Tendinopathy of right rotator cuff 12/10/2014   Primary osteoarthritis involving multiple joints 05/23/2014   Osteopenia 05/23/2014   Right tennis elbow 03/05/2014   Pain in joint, ankle and foot 05/06/2013   Left hip pain 01/17/2013   Sciatica of left side 01/17/2010   Enthesopathy of ankle and tarsus 03/11/2009   Cavus deformity of foot 03/11/2009    PCP: Morrell Riddle, PA-C   REFERRING PROVIDER: Morrell Riddle, PA-C  REFERRING DIAG: N39.41 (ICD-10-CM) - Urge incontinence  THERAPY DIAG:  Muscle weakness (generalized)  Other muscle spasm  Unspecified lack of coordination  Rationale for Evaluation and Treatment: Rehabilitation  ONSET DATE: 10 years ago, maybe longer  SUBJECTIVE:  SUBJECTIVE STATEMENT: Pt reports that she has been working on her exercises. Wants to make sure she is doing her exercises right Felt good after last visit Urgency has been better Drinking one cup of tea in the morning if she is short of sleep and needs to drive Feeling Ok without coconut oil Gets up about 3 times at average a night d/t being physically uncomfortable.  Has not had any leaking since Friday  Reports that Improvement has ben dramatic Only uses the protective underwear not to mess up her clothes from the cream    Fluid intake: Yes: 2 quarts water/ day, some black tea   Does exercise classes 6 times/ week, walks PAIN:  Are you having pain? Yes NPRS scale: 3/10 Pain location: Left and Vaginal  Pain type: burning Pain description: intermittent   Aggravating  factors: wiping, soap Relieving factors: no  PRECAUTIONS: None  RED FLAGS: None   WEIGHT BEARING RESTRICTIONS: No  FALLS:  Has patient fallen in last 6 months? No  LIVING ENVIRONMENT: Lives with: lives with their spouse Lives in: House/apartment Stairs: No Has following equipment at home: None  OCCUPATION: retired  PLOF: Independent  PATIENT GOALS: would like not to leak so much  PERTINENT HISTORY:  no Sexual abuse: No  BOWEL MOVEMENT: Pain with bowel movement: No Type of bowel movement:Type (Bristol Stool Scale) 2-5 and Strain Yes Fully empty rectum: no Leakage: Yes:   one time, very embarrassing Pads: no Fiber supplement: No  URINATION: Pain with urination: No Fully empty bladder: Yes:   Stream:  variable Urgency: Yes: has triggers Frequency: variable, gets up at night a lot, urge to pee does not typically wake her up Leakage: Urge to void and Walking to the bathroom Pads: Yes: knix and liner  INTERCOURSE: Pain with intercourse: Initial Penetration Ability to have vaginal penetration:  Yes: with lots of lubricant and care Climax: yes with a vibrator Marinoff Scale: 1/3  PREGNANCY: Vaginal deliveries 2 Tearing Yes:   - second baby- cam fast and was a big baby C-section deliveries 0 Currently pregnant No  PROLAPSE: Yes- but does not remember, maybe rectocele   OBJECTIVE:  Note: Objective measures were completed at Evaluation unless otherwise noted.  COGNITION: Overall cognitive status: Within functional limits for tasks assessed     SENSATION: Light touch: Appears intact Proprioception: Appears intact  LUMBAR SPECIAL TESTS:  Slump test: Negative   POSTURE: rounded shoulders, forward head, decreased lumbar lordosis, and flexed trunk   PELVIC ALIGNMENT: even  LUMBARAROM/PROM:  A/PROM A/PROM  eval  Flexion limited  Extension   Right lateral flexion   Left lateral flexion   Right rotation   Left rotation    (Blank rows = not  tested)  LOWER EXTREMITY ROM: slightly limited AROM throughout hips and knees LOWER EXTREMITY MMT: grossly WFL PALPATION:   General  WFL                External Perineal Exam no tenderness                             Internal Pelvic Floor tight and tender left bulbo, some tenderness on right. Pt reported burning, tension improved with STM and knee fallout   Patient confirms identification and approves PT to assess internal pelvic floor and treatment Yes  PELVIC MMT:   MMT eval  Vaginal 3/5  Internal Anal Sphincter   External Anal Sphincter   Puborectalis  Diastasis Recti no  (Blank rows = not tested)        TONE: Low anterior and posterior vaginal wall   PROLAPSE: Rectocele and cyctocele in hooklying  TODAY'S TREATMENT:                                                                                                                              DATE: 06/25/2023  EVAL see below    Neuro reed- quadruped transverse abdominis breath 10 reps- difficult d/t wrist pain                                       Diaphragmatic breathing                                         Ball press with transverse abdominis breath seated Ball press supine Bridging  Manual- pelvic floor muscle check/ release   Therapeutic activities- reviewed HEP, recent symptoms, changes     PATIENT EDUCATION:  Education details: relevant anatomy, exam findings, HEP, manual release first layer of PF for HEP Person educated: Patient Education method: Programmer, multimedia, Demonstration, Verbal cues, and Handouts Education comprehension: verbalized understanding and needs further education  HOME EXERCISE PROGRAM: [id number]  ASSESSMENT:  CLINICAL IMPRESSION: Pt  seems to be feeling better and doing better overall. Less irritation around vulva and less tenderness layer 1. Discussed no need to do layer 1 release for HEP. Does massages for her back.  Pt will continue to benefit from PT.    OBJECTIVE  IMPAIRMENTS: decreased ROM, decreased strength, increased fascial restrictions, increased muscle spasms, impaired tone, and pain.   ACTIVITY LIMITATIONS: continence and toileting  PARTICIPATION LIMITATIONS: community activity  PERSONAL FACTORS: Age, Behavior pattern, and Time since onset of injury/illness/exacerbation are also affecting patient's functional outcome.   REHAB POTENTIAL: Good  CLINICAL DECISION MAKING: Stable/uncomplicated  EVALUATION COMPLEXITY: Low   GOALS: Goals reviewed with patient? Yes  SHORT TERM GOALS: Target date: 07/23/2023    Pt will be I with initial HEP Baseline: Goal status: met  2.  Pt will report reduced Urge incontinence by 50%  Baseline:  Goal status: met  3.  Pt will be I with urge drill Baseline:  Goal status: progressing  4.  Pt will teach back urge suppression strategies and bladder irritants Baseline:  Goal status: progressing- has not had much urgency 5.  Pt will be I with manual pelvic floor release Baseline:  Goal status: met  LONG TERM GOALS: Target date: 08/20/2023    Pt will report 0 leaking Baseline:  Goal status: INITIAL  2.  Pt will report 0/10 pain in her pelvic floor Baseline:  Goal status: INITIAL  3.  Pt will be I with her advanced HEP Baseline:  Goal status: INITIAL  4.  Pt will dem improved pelvic floor strength to 4/5 at least Baseline:  Goal status: INITIAL    PLAN:  PT FREQUENCY: 1-2x/week  PT DURATION:  12 sessions  PLANNED INTERVENTIONS: 97110-Therapeutic exercises, 97530- Therapeutic activity, 97112- Neuromuscular re-education, 97535- Self Care, 21308- Manual therapy, [phone]- Electrical stimulation (manual), Dry Needling, Joint mobilization, Joint manipulation, Spinal manipulation, Spinal mobilization, and Biofeedback  PLAN FOR NEXT SESSION: continue exercises   Talbot Monarch, PT 07/24/23 9:24 AM

## 2023-07-31 ENCOUNTER — Encounter: Payer: Self-pay | Admitting: Physical Therapy

## 2023-07-31 ENCOUNTER — Ambulatory Visit: Payer: Medicare PPO | Attending: Physician Assistant | Admitting: Physical Therapy

## 2023-07-31 DIAGNOSIS — M6281 Muscle weakness (generalized): Secondary | ICD-10-CM | POA: Diagnosis present

## 2023-07-31 DIAGNOSIS — R279 Unspecified lack of coordination: Secondary | ICD-10-CM | POA: Insufficient documentation

## 2023-07-31 DIAGNOSIS — M62838 Other muscle spasm: Secondary | ICD-10-CM | POA: Insufficient documentation

## 2023-07-31 NOTE — Therapy (Signed)
 OUTPATIENT PHYSICAL THERAPY FEMALE PELVIC TREATMENT   Patient Name: Kristin Tapia MRN: [id number] DOB:26-Nov-1945, 78 y.o., female Today's Date: 07/31/2023  END OF SESSION:  PT End of Session - 07/31/23 0853     Visit Number 4    Authorization Type [employer]here Approved 12 visits 06/25/2023-08/20/2023- [phone]    Authorization Time Period 8 weeks    PT Start Time 0845    PT Stop Time 0930    PT Time Calculation (min) 45 min    Activity Tolerance Patient tolerated treatment well    Behavior During Therapy [facility] for tasks assessed/performed;Anxious                Past Medical History:  Diagnosis Date   Abnormality of gait 03/11/2009   Allergy    immunotherapy/[facility].   Anxiety    Arthritis    Asthma    childhood   Cataract    Cataract    [employer]reesia 12/07/2019   Cavus deformity of foot, acquired 03/11/2009   Cystocele    Detached retina    GERD (gastroesophageal reflux disease)    Hip pain 01/17/2013   Metatarsalgia 03/11/2009   Osteoporosis    Postmenopausal atrophic vaginitis    Rectocele    Restless leg syndrome    L leg; s/p consultation by Dohmeier/Neurology.   Sciatica of left side 01/17/2010   Spinal stenosis    Squamous cell carcinoma of skin 01/25/2015   L shoulder   Thyroid disease    Past Surgical History:  Procedure Laterality Date   APPENDECTOMY     COLONOSCOPY  04/2009   Int & Ext Hemorrhoids, L sided diverticula.  Magod.  Repeat 5 years   ESOPHAGOGASTRODUODENOSCOPY  04/2009   tiny HH, minimal antritis, gastric polyps. Magod.   EYE SURGERY     MYOMECTOMY     [facility].   SPINE SURGERY N/A    [employer]reesia 12/07/2019   TONSILLECTOMY     Patient Active Problem List   Diagnosis Date Noted   Plantar fasciitis of left foot 02/22/2022   Spinal stenosis at L4-L5 level 01/09/2020   Increased pressure in the eye, right 01/09/2020   S/P cervical spinal fusion 12/09/2019   Cervical radiculopathy 10/20/2019   Metatarsalgia of right foot  09/03/2018   Degenerative lumbar disc 11/02/2016   Psychophysiological insomnia 06/26/2016   Atrophic vaginitis 05/04/2016   Vaginal moniliasis 05/04/2016   Seasonal allergic rhinitis due to pollen 05/09/2015   Asthma, mild intermittent 05/09/2015   Tendinopathy of right rotator cuff 12/10/2014   Primary osteoarthritis involving multiple joints 05/23/2014   Osteopenia 05/23/2014   Right tennis elbow 03/05/2014   Pain in joint, ankle and foot 05/06/2013   Left hip pain 01/17/2013   Sciatica of left side 01/17/2010   Enthesopathy of ankle and tarsus 03/11/2009   Cavus deformity of foot 03/11/2009    PCP: Allen Lauraine CROME, PA-C   REFERRING PROVIDER: Allen Lauraine CROME, PA-C  REFERRING DIAG: N39.41 (ICD-10-CM) - Urge incontinence  THERAPY DIAG:  Muscle weakness (generalized)  Other muscle spasm  Unspecified lack of coordination  Rationale for Evaluation and Treatment: Rehabilitation  ONSET DATE: 10 years ago, maybe longer  SUBJECTIVE:  SUBJECTIVE STATEMENT: Pt reports that urgency is feeling much better, only leakage was 1 time about teaspoon last week. She is certainly pleased with her progress. Still has an urge at times when she is near a bathroom When she is distracted, urge goes away.  Raisins help with constipation Looking to get a ball, has not been able to be consistent. Feels like she can fully empty rectum    Fluid intake: Yes: 2 quarts water/ day, some black tea   Does exercise classes 6 times/ week, walks PAIN:  Are you having pain? Yes NPRS scale: 3/10 Pain location: Left and Vaginal  Pain type: burning Pain description: intermittent   Aggravating factors: wiping, soap Relieving factors: no  PRECAUTIONS: None  RED FLAGS: None   WEIGHT BEARING RESTRICTIONS:  No  FALLS:  Has patient fallen in last 6 months? No  LIVING ENVIRONMENT: Lives with: lives with their spouse Lives in: House/apartment Stairs: No Has following equipment at home: None  OCCUPATION: retired  PLOF: Independent  PATIENT GOALS: would like not to leak so much  PERTINENT HISTORY:  no Sexual abuse: No  BOWEL MOVEMENT: Pain with bowel movement: No Type of bowel movement:Type (Bristol Stool Scale) 2-5 and Strain Yes Fully empty rectum: no Leakage: Yes:   one time, very embarrassing Pads: no Fiber supplement: No  URINATION: Pain with urination: No Fully empty bladder: Yes:   Stream:  variable Urgency: Yes: has triggers Frequency: variable, gets up at night a lot, urge to pee does not typically wake her up Leakage: Urge to void and Walking to the bathroom Pads: Yes: knix and liner  INTERCOURSE: Pain with intercourse: Initial Penetration Ability to have vaginal penetration:  Yes: with lots of lubricant and care Climax: yes with a vibrator Marinoff Scale: 1/3  PREGNANCY: Vaginal deliveries 2 Tearing Yes:   - second baby- cam fast and was a big baby C-section deliveries 0 Currently pregnant No  PROLAPSE: Yes- but does not remember, maybe rectocele   OBJECTIVE:  Note: Objective measures were completed at Evaluation unless otherwise noted.  COGNITION: Overall cognitive status: Within functional limits for tasks assessed     SENSATION: Light touch: Appears intact Proprioception: Appears intact  LUMBAR SPECIAL TESTS:  Slump test: Negative   POSTURE: rounded shoulders, forward head, decreased lumbar lordosis, and flexed trunk   PELVIC ALIGNMENT: even  LUMBARAROM/PROM:  A/PROM A/PROM  eval  Flexion limited  Extension   Right lateral flexion   Left lateral flexion   Right rotation   Left rotation    (Blank rows = not tested)  LOWER EXTREMITY ROM: slightly limited AROM throughout hips and knees LOWER EXTREMITY MMT: grossly  WFL PALPATION:   General  WFL                External Perineal Exam no tenderness                             Internal Pelvic Floor tight and tender left bulbo, some tenderness on right. Pt reported burning, tension improved with STM and knee fallout   Patient confirms identification and approves PT to assess internal pelvic floor and treatment Yes  PELVIC MMT:   MMT eval  Vaginal 3/5  Internal Anal Sphincter   External Anal Sphincter   Puborectalis   Diastasis Recti no  (Blank rows = not tested)        TONE: Low anterior and posterior vaginal wall   PROLAPSE: Rectocele  and cyctocele in hooklying  TODAY'S TREATMENT:                                                                                                                              DATE: 07/31/23     Neuro reed- hip adduction with ball with transverse abdominis breath                      Ball press supine with transverse abdominis breath                      Bridging with transverse abdominis breath  Horizontal abduction with theraband Shoulder extensions with green theraband over door Lat pulldown 1 plate      PATIENT EDUCATION:  Education details: relevant anatomy, exam findings, HEP, manual release first layer of PF for HEP Person educated: Patient Education method: Explanation, Demonstration, Verbal cues, and Handouts Education comprehension: verbalized understanding and needs further education  HOME EXERCISE PROGRAM: [id number]  ASSESSMENT:  CLINICAL IMPRESSION: Pt progressing well in her symptoms. No more significant urgency, only when she walks to the bathroom at times. Leaking minimal.  Reviewed urge drill today Constipation is better, more complete emptying.  Pt will add her HEP in the gym after her yoga class, they have a table and machines, will look for lat pulldown machine She has one more appt, improving urgency. Has not changed any urge irritants. Consider discharge unless pt relapses.      OBJECTIVE IMPAIRMENTS: decreased ROM, decreased strength, increased fascial restrictions, increased muscle spasms, impaired tone, and pain.   ACTIVITY LIMITATIONS: continence and toileting  PARTICIPATION LIMITATIONS: community activity  PERSONAL FACTORS: Age, Behavior pattern, and Time since onset of injury/illness/exacerbation are also affecting patient's functional outcome.   REHAB POTENTIAL: Good  CLINICAL DECISION MAKING: Stable/uncomplicated  EVALUATION COMPLEXITY: Low   GOALS: Goals reviewed with patient? Yes  SHORT TERM GOALS: Target date: 07/23/2023    Pt will be I with initial HEP Baseline: Goal status: met  2.  Pt will report reduced Urge incontinence by 50%  Baseline:  Goal status: met  3.  Pt will be I with urge drill Baseline:  Goal status: progressing  4.  Pt will teach back urge suppression strategies and bladder irritants Baseline:  Goal status: progressing- has not had much urgency 5.  Pt will be I with manual pelvic floor release Baseline:  Goal status: met  LONG TERM GOALS: Target date: 08/20/2023    Pt will report 0 leaking Baseline:  Goal status: INITIAL  2.  Pt will report 0/10 pain in her pelvic floor Baseline:  Goal status: INITIAL  3.  Pt will be I with her advanced HEP Baseline:  Goal status: INITIAL  4.  Pt will dem improved pelvic floor strength to 4/5 at least Baseline:  Goal status: INITIAL    PLAN:  PT FREQUENCY: 1-2x/week  PT DURATION:  12 sessions  PLANNED INTERVENTIONS: 97110-Therapeutic  exercises, 97530- Therapeutic activity, [id number]- Neuromuscular re-education, [phone]- Self Care, 02859- Manual therapy, [phone]- Electrical stimulation (manual), Dry Needling, Joint mobilization, Joint manipulation, Spinal manipulation, Spinal mobilization, and Biofeedback  PLAN FOR NEXT SESSION: continue exercises   Timithy Arons, PT 07/31/23 8:55 AM

## 2023-08-01 ENCOUNTER — Telehealth: Payer: Self-pay

## 2023-08-01 NOTE — Telephone Encounter (Signed)
 Patient states she would like to proceed with getting evenity . Patient has her bmd scheduled for 08-06-23. Routing to Dr Tia Flowers.

## 2023-08-06 ENCOUNTER — Ambulatory Visit (HOSPITAL_BASED_OUTPATIENT_CLINIC_OR_DEPARTMENT_OTHER)
Admission: RE | Admit: 2023-08-06 | Discharge: 2023-08-06 | Disposition: A | Discharge status: Home / Self Care | Payer: Medicare PPO | Source: Ambulatory Visit | Attending: Obstetrics and Gynecology | Admitting: Obstetrics and Gynecology

## 2023-08-06 ENCOUNTER — Ambulatory Visit: Payer: Medicare PPO

## 2023-08-06 DIAGNOSIS — M8000XK Age-related osteoporosis with current pathological fracture, unspecified site, subsequent encounter for fracture with nonunion: Secondary | ICD-10-CM | POA: Diagnosis present

## 2023-08-06 DIAGNOSIS — M810 Age-related osteoporosis without current pathological fracture: Secondary | ICD-10-CM | POA: Diagnosis not present

## 2023-08-06 DIAGNOSIS — E2839 Other primary ovarian failure: Secondary | ICD-10-CM | POA: Insufficient documentation

## 2023-08-06 DIAGNOSIS — M8589 Other specified disorders of bone density and structure, multiple sites: Secondary | ICD-10-CM

## 2023-08-06 MED ORDER — ROMOSOZUMAB-AQQG 105 MG/1.17ML ~~LOC~~ SOSY
210.0000 mg | PREFILLED_SYRINGE | Freq: Once | SUBCUTANEOUS | Status: AC
  Administered 2023-08-06: 210 mg via SUBCUTANEOUS

## 2023-08-06 NOTE — Telephone Encounter (Signed)
 Call to patient. Patient requesting to schedule Evenity  injection. Nurse visit scheduled for 08-06-23 at 1445. Patient agreeable to date and time of appointment. Aware copay due at time of appointment.  Order for Evenity  placed.   Encounter closed.

## 2023-08-07 ENCOUNTER — Encounter: Payer: Medicare PPO | Admitting: Physical Therapy

## 2023-08-09 ENCOUNTER — Encounter: Payer: Self-pay | Admitting: Obstetrics and Gynecology

## 2023-08-14 ENCOUNTER — Ambulatory Visit: Payer: Medicare PPO | Admitting: Physical Therapy

## 2023-08-14 ENCOUNTER — Encounter: Payer: Self-pay | Admitting: Physical Therapy

## 2023-08-14 DIAGNOSIS — M62838 Other muscle spasm: Secondary | ICD-10-CM

## 2023-08-14 DIAGNOSIS — M6281 Muscle weakness (generalized): Secondary | ICD-10-CM

## 2023-08-14 DIAGNOSIS — R279 Unspecified lack of coordination: Secondary | ICD-10-CM

## 2023-08-14 NOTE — Therapy (Signed)
OUTPATIENT PHYSICAL THERAPY FEMALE PELVIC TREATMENT   Patient Name: Kristin Tapia MRN: [id number] DOB:[birth year], 78 y.o., female Today's Date: 08/14/2023  END OF SESSION:  PT End of Session - 08/14/23 0828     Visit Number 5    Authorization Type [employer]here Approved 12 visits 06/25/2023-08/20/2023- [id number]#[id number]    Authorization Time Period 8 weeks    Authorization - Visit Number 3    Progress Note Due on Visit 10    PT Start Time 0800    PT Stop Time 0845    PT Time Calculation (min) 45 min    Activity Tolerance Patient tolerated treatment well    Behavior During Therapy [facility] for tasks assessed/performed                 Past Medical History:  Diagnosis Date   Abnormality of gait 03/11/2009   Allergy    immunotherapy/[facility]arma [place].   Anxiety    Arthritis    Asthma    childhood   Cataract    Cataract    Phreesia 12/07/2019   Cavus deformity of foot, acquired 03/11/2009   Cystocele    Detached retina    GERD (gastroesophageal reflux disease)    Hip pain 01/17/2013   Metatarsalgia 03/11/2009   Osteoporosis    Postmenopausal atrophic vaginitis    Rectocele    Restless leg syndrome    L leg; s/p consultation by Dohmeier/Neurology.   Sciatica of left side 01/17/2010   Spinal stenosis    Squamous cell carcinoma of skin 01/25/2015   L shoulder   Thyroid disease    Past Surgical History:  Procedure Laterality Date   APPENDECTOMY     COLONOSCOPY  04/2009   Int & Ext Hemorrhoids, L sided diverticula.  Magod.  Repeat 5 years   ESOPHAGOGASTRODUODENOSCOPY  04/2009   tiny HH, minimal antritis, gastric polyps. Magod.   EYE SURGERY     MYOMECTOMY     [facility].   SPINE SURGERY N/A    [employer]reesia 12/07/2019   TONSILLECTOMY     Patient Active Problem List   Diagnosis Date Noted   Plantar fasciitis of left foot 02/22/2022   Spinal stenosis at L4-L5 level 01/09/2020   Increased pressure in the eye, right 01/09/2020   S/P cervical spinal fusion 12/09/2019    Cervical radiculopathy 10/20/2019   Metatarsalgia of right foot 09/03/2018   Degenerative lumbar disc 11/02/2016   Psychophysiological insomnia 06/26/2016   Atrophic vaginitis 05/04/2016   Vaginal moniliasis 05/04/2016   Seasonal allergic rhinitis due to pollen 05/09/2015   Asthma, mild intermittent 05/09/2015   Tendinopathy of right rotator cuff 12/10/2014   Primary osteoarthritis involving multiple joints 05/23/2014   Osteopenia 05/23/2014   Right tennis elbow 03/05/2014   Pain in joint, ankle and foot 05/06/2013   Left hip pain 01/17/2013   Sciatica of left side 01/17/2010   Enthesopathy of ankle and tarsus 03/11/2009   Cavus deformity of foot 03/11/2009    PCP: Morrell Riddle, PA-C   REFERRING PROVIDER: Morrell Riddle, PA-C  REFERRING DIAG: N39.41 (ICD-10-CM) - Urge incontinence  THERAPY DIAG:  Muscle weakness (generalized)  Other muscle spasm  Unspecified lack of coordination  Rationale for Evaluation and Treatment: Rehabilitation  ONSET DATE: 10 years ago, maybe longer  SUBJECTIVE:  SUBJECTIVE STATEMENT: Pt reports that she is feeling fine.  Pt reports that she has been extremely consistent with her HEP Pt reports that she takes a little anxiety meds at night. She reports that she is not anxious She is feeling much better, Wants to see how she is doing and take a break from PT Had one small leak in the last 2 weeks Has been doing urge suppression techniques When she is awakened at night, she does a hinge movement.  Urge does not wake her up Has a tens unit, used to use it for her back.  Fluid intake: Yes: 2 quarts water/ day, some black tea   Does exercise classes 6 times/ week, walks PAIN:  Are you having pain? Yes NPRS scale: 3/10 Pain location: Left and Vaginal  Pain  type: burning Pain description: intermittent   Aggravating factors: wiping, soap Relieving factors: no  PRECAUTIONS: None  RED FLAGS: None   WEIGHT BEARING RESTRICTIONS: No  FALLS:  Has patient fallen in last 6 months? No  LIVING ENVIRONMENT: Lives with: lives with their spouse Lives in: House/apartment Stairs: No Has following equipment at home: None  OCCUPATION: retired  PLOF: Independent  PATIENT GOALS: would like not to leak so much  PERTINENT HISTORY:  no Sexual abuse: No  BOWEL MOVEMENT: Pain with bowel movement: No Type of bowel movement:Type (Bristol Stool Scale) 2-5 and Strain Yes Fully empty rectum: no Leakage: Yes:   one time, very embarrassing Pads: no Fiber supplement: No  URINATION: Pain with urination: No Fully empty bladder: Yes:   Stream:  variable Urgency: Yes: has triggers Frequency: variable, gets up at night a lot, urge to pee does not typically wake her up Leakage: Urge to void and Walking to the bathroom Pads: Yes: knix and liner  INTERCOURSE: Pain with intercourse: Initial Penetration Ability to have vaginal penetration:  Yes: with lots of lubricant and care Climax: yes with a vibrator Marinoff Scale: 1/3  PREGNANCY: Vaginal deliveries 2 Tearing Yes:   - second baby- cam fast and was a big baby C-section deliveries 0 Currently pregnant No  PROLAPSE: Yes- but does not remember, maybe rectocele   OBJECTIVE:  Note: Objective measures were completed at Evaluation unless otherwise noted.  COGNITION: Overall cognitive status: Within functional limits for tasks assessed     SENSATION: Light touch: Appears intact Proprioception: Appears intact  LUMBAR SPECIAL TESTS:  Slump test: Negative   POSTURE: rounded shoulders, forward head, decreased lumbar lordosis, and flexed trunk   PELVIC ALIGNMENT: even  LUMBARAROM/PROM:  A/PROM A/PROM  eval  Flexion limited  Extension   Right lateral flexion   Left lateral flexion    Right rotation   Left rotation    (Blank rows = not tested)  LOWER EXTREMITY ROM: slightly limited AROM throughout hips and knees LOWER EXTREMITY MMT: grossly WFL PALPATION:   General  WFL                External Perineal Exam no tenderness                             Internal Pelvic Floor tight and tender left bulbo, some tenderness on right. Pt reported burning, tension improved with STM and knee fallout   Patient confirms identification and approves PT to assess internal pelvic floor and treatment Yes  PELVIC MMT:   MMT eval  Vaginal 3/5  Internal Anal Sphincter   External Anal Sphincter  Puborectalis   Diastasis Recti no  (Blank rows = not tested)        TONE: Low anterior and posterior vaginal wall   PROLAPSE: Rectocele and cyctocele in hooklying  TODAY'S TREATMENT:                                                                                                                              DATE: 08/14/23     There acts- review of progress and HEP, machines in the gym and [facility]  Neuro reed- Lat pulldown 1 plate with transverse abdominis breath  Rowing machine- 1 plate 10 reps Leg press 10 reps, 35 lbs     PATIENT EDUCATION:  Education details: relevant anatomy, exam findings, HEP, manual release first layer of PF for HEP Person educated: Patient Education method: Programmer, multimedia, Demonstration, Verbal cues, and Handouts Education comprehension: verbalized understanding and needs further education  HOME EXERCISE PROGRAM: [id number]  ASSESSMENT:  CLINICAL IMPRESSION: Pt has done well with PT, urgency is much better and leaking has been minimal. Pt will be discharged from PT.    OBJECTIVE IMPAIRMENTS: decreased ROM, decreased strength, increased fascial restrictions, increased muscle spasms, impaired tone, and pain.   ACTIVITY LIMITATIONS: continence and toileting  PARTICIPATION LIMITATIONS: community activity  PERSONAL FACTORS: Age,  Behavior pattern, and Time since onset of injury/illness/exacerbation are also affecting patient's functional outcome.   REHAB POTENTIAL: Good  CLINICAL DECISION MAKING: Stable/uncomplicated  EVALUATION COMPLEXITY: Low   GOALS: Goals reviewed with patient? Yes  SHORT TERM GOALS: Target date: 07/23/2023    Pt will be I with initial HEP Baseline: Goal status: discharged  2.  Pt will report reduced Urge incontinence by 50%  Baseline:  Goal status: met  3.  Pt will be I with urge drill Baseline:  Goal status: discharged  4.  Pt will teach back urge suppression strategies and bladder irritants Baseline:  discharged  5.  Pt will be I with manual pelvic floor release Baseline:  Goal status: met  LONG TERM GOALS: Target date: 08/20/2023    Pt will report 0 leaking Baseline:  Goal status: discharged  2.  Pt will report 0/10 pain in her pelvic floor Baseline:  Goal status: discharged  3.  Pt will be I with her advanced HEP Baseline:  Goal status: discharged  4.  Pt will dem improved pelvic floor strength to 4/5 at least Baseline:  Goal status: discharged    PLAN:  PT FREQUENCY: 1-2x/week  PT DURATION:  12 sessions  PLANNED INTERVENTIONS: 97110-Therapeutic exercises, 97530- Therapeutic activity, 97112- Neuromuscular re-education, 97535- Self Care, 16109- Manual therapy, [phone]- Electrical stimulation (manual), Dry Needling, Joint mobilization, Joint manipulation, Spinal manipulation, Spinal mobilization, and Biofeedback  PLAN FOR NEXT SESSION: discharged PT   Hung Rhinesmith, PT 08/14/23 8:34 AM  PHYSICAL THERAPY DISCHARGE SUMMARY   Patient agrees to discharge. Patient goals were partially met. Patient is being discharged due to meeting the stated rehab goals.

## 2023-08-28 ENCOUNTER — Ambulatory Visit: Payer: Medicare PPO | Admitting: Obstetrics

## 2023-08-28 ENCOUNTER — Ambulatory Visit (HOSPITAL_COMMUNITY)
Admission: RE | Admit: 2023-08-28 | Disposition: A | Discharge status: Home / Self Care | Source: Ambulatory Visit | Admitting: Obstetrics

## 2023-08-28 ENCOUNTER — Encounter: Payer: Self-pay | Admitting: Obstetrics

## 2023-08-28 ENCOUNTER — Other Ambulatory Visit (HOSPITAL_COMMUNITY)
Admission: RE | Admit: 2023-08-28 | Discharge: 2023-08-28 | Disposition: A | Discharge status: Home / Self Care | Source: Ambulatory Visit | Attending: Obstetrics | Admitting: Obstetrics

## 2023-08-28 ENCOUNTER — Other Ambulatory Visit (HOSPITAL_COMMUNITY)
Admission: RE | Admit: 2023-08-28 | Discharge: 2023-08-28 | Disposition: A | Discharge status: Home / Self Care | Source: Other Acute Inpatient Hospital | Attending: Obstetrics | Admitting: Obstetrics

## 2023-08-28 VITALS — BP 113/74 | HR 64 | Ht 62.6 in | Wt 134.2 lb

## 2023-08-28 DIAGNOSIS — R8290 Unspecified abnormal findings in urine: Secondary | ICD-10-CM | POA: Insufficient documentation

## 2023-08-28 DIAGNOSIS — N898 Other specified noninflammatory disorders of vagina: Secondary | ICD-10-CM | POA: Diagnosis present

## 2023-08-28 DIAGNOSIS — N816 Rectocele: Secondary | ICD-10-CM

## 2023-08-28 DIAGNOSIS — N952 Postmenopausal atrophic vaginitis: Secondary | ICD-10-CM | POA: Diagnosis not present

## 2023-08-28 DIAGNOSIS — N3941 Urge incontinence: Secondary | ICD-10-CM

## 2023-08-28 DIAGNOSIS — N941 Unspecified dyspareunia: Secondary | ICD-10-CM | POA: Diagnosis not present

## 2023-08-28 LAB — POCT URINALYSIS DIPSTICK
Bilirubin, UA: NEGATIVE
Glucose, UA: NEGATIVE
Ketones, UA: NEGATIVE
Leukocytes, UA: NEGATIVE
Nitrite, UA: NEGATIVE
Protein, UA: NEGATIVE
Spec Grav, UA: 1.015 (ref 1.010–1.025)
Urobilinogen, UA: 0.2 U/dL
pH, UA: 8.5 — AB (ref 5.0–8.0)

## 2023-08-28 LAB — URINALYSIS, ROUTINE W REFLEX MICROSCOPIC
Bacteria, UA: NONE SEEN
Bilirubin Urine: NEGATIVE
Glucose, UA: NEGATIVE mg/dL
Ketones, ur: NEGATIVE mg/dL
Leukocytes,Ua: NEGATIVE
Nitrite: NEGATIVE
Protein, ur: NEGATIVE mg/dL
Specific Gravity, Urine: 1.012 (ref 1.005–1.030)
pH: 8 (ref 5.0–8.0)

## 2023-08-28 MED ORDER — ESTRADIOL 0.1 MG/GM VA CREA: TOPICAL_CREAM | VAGINAL | 3 refills | Status: DC

## 2023-08-28 MED ORDER — LIDOCAINE 5 % EX OINT: TOPICAL_OINTMENT | CUTANEOUS | 0 refills | Status: AC

## 2023-08-28 NOTE — Addendum Note (Signed)
 Addended byWyatt Haste T on: 08/28/2023 01:31 PM   Modules accepted: Orders

## 2023-08-28 NOTE — Patient Instructions (Addendum)
 We discussed the symptoms of overactive bladder (OAB), which include urinary urgency, urinary frequency, night-time urination, with or without urge incontinence.  We discussed management including behavioral therapy (decreasing bladder irritants by following a bladder diet, urge suppression strategies, timed voids, bladder retraining), physical therapy, medication; and for refractory cases posterior tibial nerve stimulation, sacral neuromodulation, and intravesical botulinum toxin injection.   Continue vaginal estrogen 1g three times a week. Stop Yuvafem.   For vaginal atrophy (thinning of the vaginal tissue that can cause dryness and burning) and UTI prevention we discussed estrogen replacement in the form of vaginal cream.   Start vaginal estrogen therapy nightly for two weeks then 2 times weekly at night. This can be placed with your finger or an applicator inside the vagina and around the urethra.  Please let us know if the prescription is too expensive and we can look for alternative options.   Is vaginal estrogen therapy safe for me? Vaginal estrogen preparations act on the vaginal skin, and only a very tiny amount is absorbed into the bloodstream (0.01%).  They work in a similar way to hand or face cream.  There is minimal absorption and they are therefore perfectly safe. If you have had breast cancer and have persistent troublesome symptoms which aren't settling with vaginal moisturisers and lubricants, local estrogen treatment may be a possibility, but consultation with your oncologist should take place first.   - provided handout regarding vulvodynia, reviewed comfort measures and treatment options.  - Rx topical lidocaine 1g PRN pain up to 3x/day and prior to intercourse - consoder Amitriptyline 2.5%/ gabapentin 2.5%/ baclofen 2.5% in vaginal cream. Sent to compounding pharmacy for use daily - discussed conservative management options with cold compress after intercourse  - lubrication use  during intercourse - continue pelvic floor PT relaxation exercises  - discussed proper vulvar care, warm compression, avoid pad use, cotton only underwear and barrier ointment if needed   Women should try to eat at least 21 to 25 grams of fiber a day, while men should aim for 30 to 38 grams a day. You can add fiber to your diet with food or a fiber supplement such as psyllium (metamucil), benefiber, or fibercon.   Here's a look at how much dietary fiber is found in some common foods. When buying packaged foods, check the Nutrition Facts label for fiber content. It can vary among brands.  Fruits Serving size Total fiber (grams)*  Raspberries 1 cup 8.0  Pear 1 medium 5.5  Apple, with skin 1 medium 4.5  Banana 1 medium 3.0  Orange 1 medium 3.0  Strawberries 1 cup 3.0   Vegetables Serving size Total fiber (grams)*  Green peas, boiled 1 cup 9.0  Broccoli, boiled 1 cup chopped 5.0  Turnip greens, boiled 1 cup 5.0  Brussels sprouts, boiled 1 cup 4.0  Potato, with skin, baked 1 medium 4.0  Sweet corn, boiled 1 cup 3.5  Cauliflower, raw 1 cup chopped 2.0  Carrot, raw 1 medium 1.5   Grains Serving size Total fiber (grams)*  Spaghetti, whole-wheat, cooked 1 cup 6.0  Barley, pearled, cooked 1 cup 6.0  Bran flakes 3/4 cup 5.5  Quinoa, cooked 1 cup 5.0  Oat bran muffin 1 medium 5.0  Oatmeal, instant, cooked 1 cup 5.0  Popcorn, air-popped 3 cups 3.5  Brown rice, cooked 1 cup 3.5  Bread, whole-wheat 1 slice 2.0  Bread, rye 1 slice 2.0   Legumes, nuts and seeds Serving size Total fiber (grams)*  Split peas,  boiled 1 cup 16.0  Lentils, boiled 1 cup 15.5  Black beans, boiled 1 cup 15.0  Baked beans, canned 1 cup 10.0  Chia seeds 1 ounce 10.0  Almonds 1 ounce (23 nuts) 3.5  Pistachios 1 ounce (49 nuts) 3.0  Sunflower kernels 1 ounce 3.0  *Rounded to nearest 0.5 gram. Source: [employer] for [employer] have a stage 1 (out of 4)  prolapse.  We discussed the fact that it is not life threatening but there are several treatment options. For treatment of pelvic organ prolapse, we discussed options for management including expectant management, conservative management, and surgical management, such as Kegels, a pessary, pelvic floor physical therapy, and specific surgical procedures.

## 2023-08-28 NOTE — Assessment & Plan Note (Addendum)
-   uses Yuvafem 1 tab 2x/week and estradiol 0.5g 3x/week, continues to experience irritation - discussed discontinuation of Yuvafem and increase estradiol cream to 1g nightly for 2 weeks, followed by 1g 3x/week for maintenance dose - For symptomatic vaginal atrophy options include lubrication with a water-based lubricant, personal hygiene measures and barrier protection against wetness, and estrogen replacement in the form of vaginal cream, vaginal tablets, or a time-released vaginal ring.   - Nuswab to r/o infectious etiology

## 2023-08-28 NOTE — Assessment & Plan Note (Addendum)
-   POCT UA + heme, catheterized urine + heme, pending UA microscopy and culture - For management of microscopic hematuria, we discussed the importance of work-up including assessing the upper and lower GU tract with CT urogram and cystoscopy. Cr 0.85 in 07/16/23. She will pursue this work-up and follow-up afterward to discuss the results and decide on a treatment plan based on the findings.  - denies tobacco use, gross hematuria, pelvic radiation

## 2023-08-28 NOTE — Progress Notes (Addendum)
 New Patient Evaluation and Consultation  Referring Provider: Glen Land, PA-C PCP: Almetta Jacquet Date of Service: 08/28/2023  SUBJECTIVE Chief Complaint: New Patient (Initial Visit) Kristin Tapia is a 78 y.o. female here today for blood in urine, vaginal irritation. Female organ prolapse. )  History of Present Illness: Kristin Tapia is a 78 y.o. White or Caucasian female seen in consultation at the request of PA Weber for evaluation of vaginal atrophy, urgency urinary incontinence, and microscopic hematuria.    Microscopic hematuria started around 60s, prior workup around [facility] around 10 years ago.  Referred to pelvic floor PT for leakage at that time.  Vaginal irritation started several  Denies gross hematuria, kidney stone  Vaginal discharge from estrogen, improved since starting Yuvafem  2 twice a week vaginal tablet for years and small amount of vaginal estradiol  cream 3x/week  at the urethral opening since 04/2023 Self directed pelvic floor exercises, pelvic floor PT with relief of leakage, vaginal estrogen History of cystocele and rectocele on prior exam Reports back pain with sciatic 30 years ago.   Urinary Symptoms: Leaks urine with with movement to the bathroom and with urgency Leaks 1-2 time(s) per week with small volume leakage, 2-3x/day prior to PT and urge suppression Pad use: 1 liners/ mini-pads per day.   Patient is not bothered by UI symptoms.  Day time voids 4-5.  Nocturia: 1-3 times per night to void, due to need to move from RLS and low back, SI and sciatic nerve pain and not bladder urgency Stops drinking fluids around 6:30, sleeps around 9 Denies LE edema  Voiding dysfunction:  empties bladder well.  Patient does not use a catheter to empty bladder.  When urinating, patient feels a weak stream Drinks: 48oz water per day, 8oz soy milk, soups 4-5x/wk  UTIs:  1  UTI's in the last year.   Denies history of kidney or bladder stones,  pyelonephritis, bladder cancer, and kidney cancer No results found for the last 90 days.   Pelvic Organ Prolapse Symptoms:                  Patient Denies a feeling of a bulge the vaginal area. Reports seeing a bulge at the vaginal opening.   Bowel Symptom: Bowel movements: 1-2 time(s) per day Stool consistency: soft  Straining: no.  Splinting: yes, every few weeks when she has hard stool Incomplete evacuation: yes, managed by intermittent splinting Patient reports accidental bowel leakage / fecal incontinence  Occurs: 1 time(s) last year  Consistency with leakage: soft  when she was walking Bowel regimen: diet and fiber with raisin and vegetables, prior miralax use without relief Last colonoscopy: Results internal hemorrhoids and small mouth diverticula  HM Colonoscopy          Completed or No Longer Recommended     Colonoscopy  Discontinued      Frequency changed to Never automatically (Topic No Longer Applies)   09/29/2019  HM Colonoscopy component of HM COLONOSCOPY   Only the first 1 history entries have been loaded, but more history exists.                Sexual Function Sexually active: yes.  Sexual orientation: Straight Pain with sex: Yes, at the vaginal opening started last fall when she saw redness and varicose veins last fall. Previously attributed to atrophy, treated for yeast infection without relief.  Alleviated by avoiding intercourse for 5 weeks. Used vibrator for orgasm and adjusted techniques for  intercourse prior to pelvic floor PT.  Pelvic Pain Denies pelvic pain  Past Medical History:  Past Medical History:  Diagnosis Date   Abnormality of gait 03/11/2009   Allergy    immunotherapy/[facility] [place].   Anxiety    Arthritis    Asthma    childhood   Cataract    Cataract    [facility] 12/07/2019   Cavus deformity of foot, acquired 03/11/2009   Cystocele    Detached retina    GERD (gastroesophageal reflux disease)    Hip pain 01/17/2013    Metatarsalgia 03/11/2009   Osteoporosis    Postmenopausal atrophic vaginitis    Rectocele    Restless leg syndrome    L leg; s/p consultation by [facility]/Neurology.   Sciatica of left side 01/17/2010   Spinal stenosis    Squamous cell carcinoma of skin 01/25/2015   L shoulder   Thyroid disease      Past Surgical History:   Past Surgical History:  Procedure Laterality Date   APPENDECTOMY     COLONOSCOPY  04/2009   Int & Ext Hemorrhoids, L sided diverticula.  [facility].  Repeat 5 years   ESOPHAGOGASTRODUODENOSCOPY  04/2009   tiny HH, minimal antritis, gastric polyps. [facility].   EYE SURGERY     MYOMECTOMY     [facility].   SPINE SURGERY N/A    [facility] 12/07/2019   TONSILLECTOMY       Past OB/GYN History: OB History  Gravida Para Term Preterm AB Living  2 2 2   2   SAB IAB Ectopic Multiple Live Births      2    # Outcome Date GA Lbr Len/2nd Weight Sex Type Anes PTL Lv  2 Term     M Vag-Spont   LIV  1 Term     F Vag-Spont   LIV    Vaginal deliveries: 2, reports perineal laceration repair.  Forceps/ Vacuum deliveries: 0, Cesarean section: 0 Menopausal: Yes, at age 74, Denies vaginal bleeding since menopause Contraception: s/p menopause. Last pap smear was NILM 2014.  Any history of abnormal pap smears: yes. No results found for: "DIAGPAP", "HPVHIGH", "ADEQPAP"  Medications: Patient has a current medication list which includes the following prescription(s): acetaminophen, alprazolam , calcium-magnesium-vitamin d , cholecalciferol, cranberry, diclofenac  sodium, epinephrine, famotidine , fexofenadine, horizant, ipratropium, latanoprost, lidocaine , magnesium, vitamin k2, fish oil, OVER THE COUNTER MEDICATION, timolol, estradiol , fluticasone, levocetirizine, and evenity .   Allergies: Patient is allergic to sulfonamide derivatives.   Social History:  Social History   Tobacco Use   Smoking status: Never   Smokeless tobacco: Never  Vaping Use   Vaping status: Never Used  Substance  Use Topics   Alcohol use: Never   Drug use: No    Relationship status: married Patient lives with her husband.   Patient is not employed. Regular exercise: Yes: water aerobic, chair yoga and walking History of abuse: No  Family History:   Family History  Problem Relation Age of Onset   Arthritis Mother        mild   Hypertension Mother    Dementia Mother    Cancer Mother 72       breast cancer   Hyperlipidemia Mother    Heart disease Mother    COPD Father    Stroke Father    Heart disease Father    Seizures Brother    Thyroid cancer Brother    Thyroid disease Brother    Osteoporosis Neg Hx    Bladder Cancer Neg Hx  Uterine cancer Neg Hx      Review of Systems: Review of Systems  Constitutional:  Positive for malaise/fatigue. Negative for fever and weight loss.       Weight gain  Respiratory:  Positive for cough. Negative for shortness of breath and wheezing.   Cardiovascular:  Negative for chest pain, palpitations and leg swelling.  Gastrointestinal:  Negative for abdominal pain, blood in stool and constipation.  Genitourinary:  Positive for frequency (night time) and urgency. Negative for dysuria and hematuria.       Vaginal discharge, leakage  Skin:  Negative for rash.  Neurological:  Positive for dizziness. Negative for weakness and headaches.  Endo/Heme/Allergies:  Bruises/bleeds easily.  Psychiatric/Behavioral:  Positive for depression. The patient is nervous/anxious.      OBJECTIVE Physical Exam: Vitals:   08/28/23 0811  BP: 113/74  Pulse: 64  Weight: 134 lb 3.2 oz (60.9 kg)  Height: 5' 2.6" (1.59 m)    Physical Exam Constitutional:      General: She is not in acute distress.    Appearance: Normal appearance.  Genitourinary:     Bladder and urethral meatus normal.     No lesions in the vagina.     Right Labia: tenderness.     Right Labia: No rash, lesions, skin changes or Bartholin's cyst.    Left Labia: No tenderness, lesions, skin changes,  Bartholin's cyst or rash.       No vaginal discharge, erythema, tenderness, bleeding, ulceration or granulation tissue.     Moderate vaginal atrophy present.     Right Adnexa: not tender, not full and no mass present.    Left Adnexa: not tender, not full and no mass present.    No cervical motion tenderness, discharge, friability, lesion, polyp or nabothian cyst.     Uterus is not enlarged, fixed, tender or irregular.     No uterine mass detected.    Urethral meatus caruncle not present.    No urethral prolapse, tenderness, mass, hypermobility, discharge or stress urinary incontinence with cough stress test present.     Bladder is not tender, urgency on palpation not present and masses not present.      Pelvic Floor: Levator muscle strength is 3/5.    Levator ani not tender, obturator internus not tender, no asymmetrical contractions present and no pelvic spasms present.    Symmetrical pelvic sensation, anal wink present and BC reflex present. Cardiovascular:     Rate and Rhythm: Normal rate.  Pulmonary:     Effort: Pulmonary effort is normal. No respiratory distress.  Abdominal:     General: There is no distension.     Palpations: Abdomen is soft. There is no mass.     Tenderness: There is no abdominal tenderness.     Hernia: No hernia is present.    Neurological:     Mental Status: She is alert.  Vitals reviewed. Exam conducted with a chaperone present.      POP-Q:   POP-Q  -2                                            Aa   -2  Ba  -6                                              C   2                                            Gh  3                                            Pb  7                                            tvl   -2                                            Ap  -2                                            Bp  -6                                              D    Post-Void Residual (PVR) by  Bladder Scan: In order to evaluate bladder emptying, we discussed obtaining a postvoid residual and patient agreed to this procedure.  Procedure: The ultrasound unit was placed on the patient's abdomen in the suprapubic region after the patient had voided.    Post Void Residual - 08/28/23 0821       Post Void Residual   Post Void Residual 3 mL              Laboratory Results: Lab Results  Component Value Date   COLORU Yellow 08/28/2023   CLARITYU Clear 08/28/2023   GLUCOSEUR Negative 08/28/2023   BILIRUBINUR NEGATIVE 08/28/2023   KETONESU Negative 08/28/2023   SPECGRAV 1.015 08/28/2023   RBCUR Moderate 08/28/2023   PHUR 8.5 (A) 08/28/2023   PROTEINUR NEGATIVE 08/28/2023   UROBILINOGEN 0.2 08/28/2023   LEUKOCYTESUR NEGATIVE 08/28/2023    Lab Results  Component Value Date   CREATININE 0.85 07/16/2023   CREATININE 0.90 02/11/2020   CREATININE 0.91 07/08/2019    Lab Results  Component Value Date   HGBA1C 5.3 12/20/2016    Lab Results  Component Value Date   HGB 12.6 02/11/2020     ASSESSMENT AND PLAN Ms. Chiari is a 78 y.o. with:  1. Urge urinary incontinence   2. Atrophic vaginitis   3. Dyspareunia, female   4. Abnormal urinalysis   5. Pelvic organ prolapse quantification stage 1 rectocele     Urge urinary incontinence Assessment & Plan: - POCT UA + leuk/heme, pending UA microscopy and culture - attributed to RLS and  back pain which requires movement at night, denies association with urinary urgency - improved since pelvic floor PT - continue vaginal estrogen for atrophy - We discussed the symptoms of overactive bladder (OAB), which include urinary urgency, urinary frequency, nocturia, with or without urge incontinence.  While we do not know the exact etiology of OAB, several treatment options exist. We discussed management including behavioral therapy (decreasing bladder irritants, urge suppression strategies, timed voids, bladder retraining),  physical therapy, medication; for refractory cases posterior tibial nerve stimulation, sacral neuromodulation, and intravesical botulinum toxin injection.  For anticholinergic medications, we discussed the potential side effects of anticholinergics including dry eyes, dry mouth, constipation, cognitive impairment and urinary retention. For Beta-3 agonist medication, we discussed the potential side effect of elevated blood pressure which is more likely to occur in individuals with uncontrolled hypertension. - continue bladder training and reviewed fluid management  Orders: -     POCT urinalysis dipstick -     Estradiol ; Use 1g nightly for 2 weeks, followed by 1g 3 times a week  Dispense: 42.5 g; Refill: 3  Atrophic vaginitis Assessment & Plan: - uses Yuvafem  1 tab 2x/week and estradiol  0.5g 3x/week, continues to experience irritation - discussed discontinuation of Yuvafem  and increase estradiol  cream to 1g nightly for 2 weeks, followed by 1g 3x/week for maintenance dose - For symptomatic vaginal atrophy options include lubrication with a water-based lubricant, personal hygiene measures and barrier protection against wetness, and estrogen replacement in the form of vaginal cream, vaginal tablets, or a time-released vaginal ring.   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 Discussed likely neuropathic origin of pain - provided handout regarding vulvodynia, reviewed comfort measures and treatment options.  - Rx topical lidocaine  1g PRN pain up to 3x/day - Consider  Amitriptyline  2.5%/ gabapentin  2.5%/ baclofen 2.5% in vaginal cream. Sent to compounding pharmacy for use daily - discussed conservative management options with cold compress after intercourse  - lubrication use during intercourse with samples provided - s/p pelvic floor PT    Orders: -     Lidocaine ; Use 0.5g (peasize) up to 3 times a day or 15-4min prior to intercourse  Dispense: 35.44 g; Refill: 0 -     Estradiol ; Use 1g nightly for 2 weeks, followed by 1g 3 times a week  Dispense: 42.5 g; Refill: 3  Abnormal urinalysis Assessment & Plan: - POCT UA + heme, catheterized urine + heme, pending UA microscopy and culture - For management of microscopic hematuria, we discussed the importance of work-up including assessing the upper and lower GU tract with CT urogram and cystoscopy. Cr 0.85 in 07/16/23. She will pursue this work-up and follow-up afterward to discuss the results and decide on a treatment plan based on the findings.  - denies tobacco use, gross hematuria, pelvic radiation  Orders: -     Urine Culture; Future -     Urine Microscopic; Future -     Estradiol ; Use 1g nightly for 2 weeks, followed by 1g 3 times a  week  Dispense: 42.5 g; Refill: 3  Pelvic organ prolapse quantification stage 1 rectocele Assessment & Plan: - splints every 2-3 weeks when she experiences Bristol I-II stool - For treatment of pelvic organ prolapse, we discussed options for management including expectant management, conservative management, and surgical management, such as Kegels, a pessary, pelvic floor physical therapy, and specific surgical procedures. - encouraged slow titration of fiber supplementation to optimize stool consistency - continue squatty potty use and avoid valsalva   Time spent: I spent 68 minutes dedicated to the care of this patient on the date of this encounter to include pre-visit review of records, face-to-face time with the patient discussing urgency urinary incontinence,  atrophic vaginitis, stage I pelvic organ prolapse, abnormal urinalysis, dyspareunia, and post visit documentation and ordering medication/ testing.   Darlene Ehlers, MD

## 2023-08-28 NOTE — Assessment & Plan Note (Addendum)
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 Discussed likely neuropathic origin of pain - provided handout regarding vulvodynia, reviewed comfort measures and treatment options.  - Rx topical lidocaine  1g PRN pain up to 3x/day - Consider Amitriptyline  2.5%/ gabapentin  2.5%/ baclofen 2.5% in vaginal cream. Sent to compounding pharmacy for use daily - discussed conservative management options with cold compress after intercourse  - lubrication use during intercourse with samples provided - s/p pelvic floor PT

## 2023-08-28 NOTE — Assessment & Plan Note (Signed)
-   splints every 2-3 weeks when she experiences Bristol I-II stool - For treatment of pelvic organ prolapse, we discussed options for management including expectant management, conservative management, and surgical management, such as Kegels, a pessary, pelvic floor physical therapy, and specific surgical procedures. - encouraged slow titration of fiber supplementation to optimize stool consistency - continue squatty potty use and avoid valsalva

## 2023-08-28 NOTE — Assessment & Plan Note (Addendum)
-   POCT UA + leuk/heme, pending UA microscopy and culture - attributed to RLS and back pain which requires movement at night, denies association with urinary urgency - improved since pelvic floor PT - continue vaginal estrogen for atrophy - We discussed the symptoms of overactive bladder (OAB), which include urinary urgency, urinary frequency, nocturia, with or without urge incontinence.  While we do not know the exact etiology of OAB, several treatment options exist. We discussed management including behavioral therapy (decreasing bladder irritants, urge suppression strategies, timed voids, bladder retraining), physical therapy, medication; for refractory cases posterior tibial nerve stimulation, sacral neuromodulation, and intravesical botulinum toxin injection.  For anticholinergic medications, we discussed the potential side effects of anticholinergics including dry eyes, dry mouth, constipation, cognitive impairment and urinary retention. For Beta-3 agonist medication, we discussed the potential side effect of elevated blood pressure which is more likely to occur in individuals with uncontrolled hypertension. - continue bladder training and reviewed fluid management

## 2023-08-29 ENCOUNTER — Encounter: Payer: Self-pay | Admitting: Obstetrics

## 2023-08-29 ENCOUNTER — Encounter: Payer: Self-pay | Admitting: Obstetrics and Gynecology

## 2023-08-29 LAB — CERVICOVAGINAL ANCILLARY ONLY
Bacterial Vaginitis (gardnerella): NEGATIVE
Candida Glabrata: NEGATIVE
Candida Vaginitis: NEGATIVE
Comment: NEGATIVE
Comment: NEGATIVE
Comment: NEGATIVE

## 2023-08-29 LAB — URINE CULTURE: Culture: NO GROWTH

## 2023-09-03 ENCOUNTER — Ambulatory Visit: Payer: Medicare PPO | Admitting: *Deleted

## 2023-09-03 VITALS — BP 134/82 | HR 76 | Resp 14

## 2023-09-03 DIAGNOSIS — M810 Age-related osteoporosis without current pathological fracture: Secondary | ICD-10-CM | POA: Diagnosis not present

## 2023-09-03 MED ORDER — ROMOSOZUMAB-AQQG 105 MG/1.17ML ~~LOC~~ SOSY: 210.0000 mg | PREFILLED_SYRINGE | Freq: Once | SUBCUTANEOUS | Status: DC

## 2023-09-03 MED ORDER — ROMOSOZUMAB-AQQG 105 MG/1.17ML ~~LOC~~ SOSY
210.0000 mg | PREFILLED_SYRINGE | Freq: Once | SUBCUTANEOUS | Status: AC
  Administered 2023-09-03: 210 mg via SUBCUTANEOUS

## 2023-09-03 NOTE — Progress Notes (Signed)
 Patient in today for second Evenity injection. Patient's initial calcium level was obtained on 07/16/23.  Result: 9.4, GFR= 71.  Last AEX: 07/02/23 EB Last BMD: 08/06/23  Injection given in right arm.  Patient tolerated injection well.  Routed to provider for review.

## 2023-09-19 ENCOUNTER — Ambulatory Visit: Admitting: Family Medicine

## 2023-09-19 ENCOUNTER — Ambulatory Visit
Admission: RE | Admit: 2023-09-19 | Discharge: 2023-09-19 | Disposition: A | Discharge status: Home / Self Care | Source: Ambulatory Visit | Attending: Family Medicine | Admitting: Family Medicine

## 2023-09-19 ENCOUNTER — Encounter: Payer: Self-pay | Admitting: Family Medicine

## 2023-09-19 ENCOUNTER — Ambulatory Visit: Payer: Self-pay

## 2023-09-19 VITALS — BP 128/70 | Ht 62.5 in | Wt 130.0 lb

## 2023-09-19 DIAGNOSIS — M25571 Pain in right ankle and joints of right foot: Secondary | ICD-10-CM | POA: Diagnosis not present

## 2023-09-19 NOTE — Assessment & Plan Note (Signed)
-   Kristin Tapia's pain came on very suddenly without obvious injury.  She does have a recent change in her footwear and orthotics with 5-6 miles of walking daily. - Her differential includes sinus tarsi syndrome, pinching of the joint capsule, or distal fibular osteoporotic fracture -She has been ambulating but with pain.  She was fitted in an ASO brace for extra support today. - Given the acute onset of her injury, worsening pain with weightbearing, and nonspecific ultrasound findings we will get an x-ray to rule out any fractures. - We will follow-up with her once we have the x-ray results.

## 2023-09-19 NOTE — Progress Notes (Signed)
 Kristin Tapia - 78 y.o. female MRN [id number]  Date of birth: Mar 22, 1946  PCP: Kristin Riddle, PA-C  Subjective:  No chief complaint on file. Acute right ankle pain  HPI: Past Medical, Surgical, Social, and Family History Reviewed & Updated per EMR.   Patient is a 78 y.o. female here for ankle pain that started 3 days ago after standing up from a seated position. There was sharp, constant, severe pain over the right anterior lateral ankle with immediate swelling. She recently changed her walking shoes and insoles for the 5-6 miles of daily walking she does. The pain is improved with rest and increased initially with walking but now is increasing, even with water aerobics. She denies any numbness weakness, redness, warmth, fevers, or chills. She does have a hx of osteopenia and previous fracture of the right ankle.   Past Surgical History:  Procedure Laterality Date   APPENDECTOMY     COLONOSCOPY  04/2009   Int & Ext Hemorrhoids, L sided diverticula.  [facility].  Repeat 5 years   ESOPHAGOGASTRODUODENOSCOPY  04/2009   tiny HH, minimal antritis, gastric polyps. [facility].   EYE SURGERY     MYOMECTOMY     [facility].   SPINE SURGERY N/A    [facility] 12/07/2019   TONSILLECTOMY      Allergies  Allergen Reactions   Sulfonamide Derivatives Anaphylaxis        Objective:  Physical Exam: VS: BP:128/70  HR: bpm  TEMP: ( )  RESP:   HT:5' 2.5" (158.8 cm)   WT:130 lb (59 kg)  BMI:23.38  Gen: Well developed, NAD, speaks clearly, comfortable in exam room Respiratory: Normal work of breathing on room air, no respiratory distress Skin: No rashes, abrasions, or ecchymosis MSK:  Right Ankle: Visible edema over the anterior portion of the distal fibular head but no erythema. Range of motion is full but uncomfortable. Strength is 5/5 in all directions Anterior drawer negative, Talar tilt equivocal  Squeeze test negative, Kleiger test negative Talar dome NT Base of 5th MT NT Cuboid  NT Navicular NT TTP over the ATFL/sinus tarsi area No sign of peroneal tendon subluxations or tenderness to palpation No sensory deficits Neuro: NVID  Ultrasound of the right ankle:  - The talar dome was visualized in the SAX and LAX. No hypoechoic changes in the articular surface suggestive of fracture. No calcifications.  - The ankle joint was visualized in LAX. no hypoechoic areas suggestive of effusion but soft tissue edema over the lateral aspect - Posterior edge of lateral malleolus visualized w/out disruption of the ostium.  -The anterior distal fibula has an irregular contour of the distalmost portion of the bony cortex with overlying soft tissue edema -The sinus tarsi has soft tissue edema with a free-floating calcification but no echogenic shadowing. - Fibularis longus and brevis visualized in SAX and LAX at the malleolus and followed distally to the peroneal tubercle.  There is minimal hypoechoic fluid surrounding the distalmost portions of the tendons just past the peroneal tubercle.   Summary: Soft tissue edema with scarring in the sinus tarsi.  Nonspecific cortical irregularities of the distal, anterior fibula  Ultrasound and interpretation by Dr. Webb Silversmith and Dr. Christella Hartigan    Assessment & Plan:   Acute right ankle pain - [name]'s pain came on very suddenly without obvious injury.  She does have a recent change in her footwear and orthotics with 5-6 miles of walking daily. - Her differential includes sinus tarsi syndrome, pinching of the joint capsule, or  distal fibular osteoporotic fracture -She has been ambulating but with pain.  She was fitted in an ASO brace for extra support today. - Given the acute onset of her injury, worsening pain with weightbearing, and nonspecific ultrasound findings we will get an x-ray to rule out any fractures. - We will follow-up with her once we have the x-ray results.   Rica Mote MD [facility] Fellow

## 2023-09-21 NOTE — Addendum Note (Signed)
 Addended by: Andi Devon on: 09/21/2023 09:10 AM   Modules accepted: Level of Service

## 2023-09-24 ENCOUNTER — Telehealth: Payer: Self-pay | Admitting: *Deleted

## 2023-09-24 ENCOUNTER — Encounter: Payer: Self-pay | Admitting: Obstetrics and Gynecology

## 2023-09-24 NOTE — Telephone Encounter (Signed)
 Patient left message. Patient states she has been struggling with nasal congestion, spoke with allergist, nasal spray was recommended. Patient is on Evenity and is concerned about starting a steroid. She is scheduled to see ENT end of 09/2023.   Patient is requesting recommendations from Dr. Karma Greaser regarding nasal spray and Evenity.

## 2023-10-01 ENCOUNTER — Ambulatory Visit

## 2023-10-01 DIAGNOSIS — M810 Age-related osteoporosis without current pathological fracture: Secondary | ICD-10-CM | POA: Diagnosis not present

## 2023-10-22 NOTE — Progress Notes (Unsigned)
 [facility] Return Visit  SUBJECTIVE  History of Present Illness: Kristin Tapia is a 78 y.o. female seen in follow-up for stage I pelvic organ prolapse, urgency urinary incontinence, vaginal atrophy, dyspareunia, and abnormal urinalysis. Plan at last visit was continue vaginal estrogen, continue bladder training and fluid management, and titration of fiber supplementation.   Nuswab 08/28/23 negative. UA + heme, 0-5 RBC/hpf, culture negative *** topical lidocaine   Past Medical History: Patient  has a past medical history of Abnormality of gait (03/11/2009), Allergy, Anxiety, Arthritis, Asthma, Cataract, Cataract, Cavus deformity of foot, acquired (03/11/2009), Cystocele, Detached retina, GERD (gastroesophageal reflux disease), Hip pain (01/17/2013), Metatarsalgia (03/11/2009), Osteoporosis, Postmenopausal atrophic vaginitis, Rectocele, Restless leg syndrome, Sciatica of left side (01/17/2010), Spinal stenosis, Squamous cell carcinoma of skin (01/25/2015), and Thyroid disease.   Past Surgical History: She  has a past surgical history that includes Appendectomy; Colonoscopy (04/2009); Esophagogastroduodenoscopy (04/2009); Tonsillectomy; Myomectomy; Eye surgery; and Spine surgery (N/A).   Medications: She has a current medication list which includes the following prescription(s): acetaminophen, alprazolam , calcium-magnesium-vitamin d , cholecalciferol, cranberry, diclofenac  sodium, epinephrine, estradiol , famotidine , fexofenadine, horizant, ipratropium, latanoprost, lidocaine , magnesium, vitamin k2, fish oil, OVER THE COUNTER MEDICATION, and timolol.   Allergies: Patient is allergic to sulfonamide derivatives.   Social History: Patient  reports that she has never smoked. She has never used smokeless tobacco. She reports that she does not drink alcohol and does not use drugs.     OBJECTIVE     Physical Exam: There were no vitals filed for this visit. Gen: No apparent distress,  A&O x 3.  Detailed Urogynecologic Evaluation:  Deferred. Prior exam showed:      No data to display             ASSESSMENT AND PLAN    Ms. Lamotte is a 78 y.o. with:  No diagnosis found.  There are no diagnoses linked to this encounter.   Darlene Ehlers, MD

## 2023-10-23 ENCOUNTER — Ambulatory Visit: Admitting: Obstetrics

## 2023-10-23 ENCOUNTER — Encounter: Payer: Self-pay | Admitting: Obstetrics

## 2023-10-23 VITALS — BP 102/66 | HR 67

## 2023-10-23 DIAGNOSIS — N941 Unspecified dyspareunia: Secondary | ICD-10-CM | POA: Diagnosis not present

## 2023-10-23 DIAGNOSIS — N952 Postmenopausal atrophic vaginitis: Secondary | ICD-10-CM | POA: Diagnosis not present

## 2023-10-23 DIAGNOSIS — K5900 Constipation, unspecified: Secondary | ICD-10-CM | POA: Diagnosis not present

## 2023-10-23 DIAGNOSIS — N3941 Urge incontinence: Secondary | ICD-10-CM | POA: Diagnosis not present

## 2023-10-23 DIAGNOSIS — N816 Rectocele: Secondary | ICD-10-CM

## 2023-10-23 NOTE — Assessment & Plan Note (Signed)
-   vaginal atrophy on exam despite Yuvafem  2 tab/week and estradiol  cream 0.5g three times a week - continue estradiol  cream 1g 3x/week - left sided vulvodynia with SI joint and L sided vulvodynia in the setting of L sided sciatica pain. Discussed likely neuropathic origin of pain - provided handout regarding vulvodynia, reviewed comfort measures and treatment options.  - Rx topical lidocaine  1g PRN pain up to 3x/day causes burning - Consider Amitriptyline  2.5%/ gabapentin  2.5%/ baclofen 2.5% in vaginal cream. Sent to compounding pharmacy for use daily - discussed conservative management options with cold compress after intercourse  - lubrication use during intercourse with samples provided - s/p pelvic floor PT, desires to return with referral replaced

## 2023-10-23 NOTE — Assessment & Plan Note (Addendum)
-   08/28/23 POCT UA + leuk/heme, UA microscopy 0-5 RBC/hpf and culture negative - attributed to RLS and back pain which requires movement at night, denies association with urinary urgency - improved since pelvic floor PT, desires to return - continue vaginal estrogen for atrophy 1g 3x/week - We discussed the symptoms of overactive bladder (OAB), which include urinary urgency, urinary frequency, nocturia, with or without urge incontinence.  While we do not know the exact etiology of OAB, several treatment options exist. We discussed management including behavioral therapy (decreasing bladder irritants, urge suppression strategies, timed voids, bladder retraining), physical therapy, medication; for refractory cases posterior tibial nerve stimulation, sacral neuromodulation, and intravesical botulinum toxin injection.  For anticholinergic medications, we discussed the potential side effects of anticholinergics including dry eyes, dry mouth, constipation, cognitive impairment and urinary retention. For Beta-3 agonist medication, we discussed the potential side effect of elevated blood pressure which is more likely to occur in individuals with uncontrolled hypertension. - continue bladder training and reviewed fluid management - declines medications at this time, reassess after return to pelvic floor PT and optimization of stool consistency

## 2023-10-23 NOTE — Patient Instructions (Addendum)
 Continue vaginal estrogen 1g 3 times a week.   - discussed proper vulvar care, warm compression, avoid pad use, cotton only underwear and barrier ointment if needed   Discontinue psyllium fiber.   Consider starting miralax and titrate as needed if you continue to experience constipation.   Please call [phone] to schedule the earliest appointment for pelvic floor PT.

## 2023-10-23 NOTE — Assessment & Plan Note (Signed)
-   uses estradiol  1g 3x/wk with resolution of vaginal pain - discontinue Yuvafem   - For symptomatic vaginal atrophy options include lubrication with a water-based lubricant, personal hygiene measures and barrier protection against wetness, and estrogen replacement in the form of vaginal cream, vaginal tablets, or a time-released vaginal ring.   - Nuswab negative 08/28/23

## 2023-10-23 NOTE — Assessment & Plan Note (Signed)
-   worsened after psyllium fiber supplementation - For constipation, we reviewed the importance of a better bowel regimen.  We also discussed the importance of avoiding chronic straining, as it can exacerbate her pelvic floor symptoms; we discussed treating constipation and straining prior to surgery, as postoperative straining can lead to damage to the repair and recurrence of symptoms. We discussed initiating therapy with increasing fluid intake, fiber supplementation, stool softeners, and laxatives such as miralax.  - Stop fiber supplementation and reassess symptoms, discussed titration of miralax

## 2023-10-29 ENCOUNTER — Ambulatory Visit

## 2023-10-29 DIAGNOSIS — M810 Age-related osteoporosis without current pathological fracture: Secondary | ICD-10-CM | POA: Diagnosis not present

## 2023-10-29 MED ORDER — ROMOSOZUMAB-AQQG 105 MG/1.17ML ~~LOC~~ SOSY
210.0000 mg | PREFILLED_SYRINGE | Freq: Once | SUBCUTANEOUS | Status: AC
  Administered 2023-10-29: 210 mg via SUBCUTANEOUS

## 2023-10-29 NOTE — Therapy (Signed)
 OUTPATIENT PHYSICAL THERAPY FEMALE PELVIC EVALUATION   Patient Name: Kristin Tapia MRN: [id number] DOB:1945-07-30, 78 y.o., female Today's Date: 10/30/2023  END OF SESSION:  PT End of Session - 10/30/23 1718     Visit Number 1    Date for PT Re-Evaluation 01/30/24    Authorization Type [employer] M[employer]  AUTH REQUIRED   - waiting on auth    PT Start Time 1615    PT Stop Time 1700    PT Time Calculation (min) 45 min    Activity Tolerance Patient tolerated treatment well    Behavior During Therapy [facility] for tasks assessed/performed             Past Medical History:  Diagnosis Date   Abnormality of gait 03/11/2009   Allergy    immunotherapy/[facility] [place].   Anxiety    Arthritis    Asthma    childhood   Cataract    Cataract    [employer] 12/07/2019   Cavus deformity of foot, acquired 03/11/2009   Cystocele    Detached retina    GERD (gastroesophageal reflux disease)    Hip pain 01/17/2013   Metatarsalgia 03/11/2009   Osteoporosis    Postmenopausal atrophic vaginitis    Rectocele    Restless leg syndrome    L leg; s/p consultation by Dohmeier/Neurology.   Sciatica of left side 01/17/2010   Spinal stenosis    Squamous cell carcinoma of skin 01/25/2015   L shoulder   Thyroid disease    Past Surgical History:  Procedure Laterality Date   APPENDECTOMY     COLONOSCOPY  04/2009   Int & Ext Hemorrhoids, L sided diverticula.  [facility].  Repeat 5 years   ESOPHAGOGASTRODUODENOSCOPY  04/2009   tiny HH, minimal antritis, gastric polyps. [facility].   EYE SURGERY     MYOMECTOMY     [facility].   SPINE SURGERY N/A    [facility] 12/07/2019   TONSILLECTOMY     Patient Active Problem List   Diagnosis Date Noted   Constipation 10/23/2023   Acute right ankle pain 09/19/2023   Urge urinary incontinence 08/28/2023   Dyspareunia, female 08/28/2023   Abnormal urinalysis 08/28/2023   Pelvic organ prolapse quantification stage 1 rectocele 08/28/2023   Plantar fasciitis of left foot  02/22/2022   Spinal stenosis at L4-L5 level 01/09/2020   Increased pressure in the eye, right 01/09/2020   S/P cervical spinal fusion 12/09/2019   Cervical radiculopathy 10/20/2019   Metatarsalgia of right foot 09/03/2018   Degenerative lumbar disc 11/02/2016   Psychophysiological insomnia 06/26/2016   Atrophic vaginitis 05/04/2016   Vaginal moniliasis 05/04/2016   Seasonal allergic rhinitis due to pollen 05/09/2015   Asthma, mild intermittent 05/09/2015   Tendinopathy of right rotator cuff 12/10/2014   Primary osteoarthritis involving multiple joints 05/23/2014   Osteopenia 05/23/2014   Right tennis elbow 03/05/2014   Pain in joint, ankle and foot 05/06/2013   Left hip pain 01/17/2013   Sciatica of left side 01/17/2010   Enthesopathy of ankle and tarsus 03/11/2009   Cavus deformity of foot 03/11/2009    PCP: Glen Land, PA-C  REFERRING PROVIDER:  Darlene Ehlers, MD  REFERRING DIAG: N81.6 (ICD-10-CM) - Pelvic organ prolapse quantification stage 1 rectocele N94.10 (ICD-10-CM) - Dyspareunia, female N39.41 (ICD-10-CM) - Urge urinary incontinence Nocturia   THERAPY DIAG:  Muscle weakness (generalized)  Other muscle spasm  Unspecified lack of coordination  Rationale for Evaluation and Treatment: Rehabilitation  ONSET DATE: 2015  SUBJECTIVE:  SUBJECTIVE STATEMENT: Pt reports that she felt good after she was discharged from PT last round, she started having more leakage during the night. Sometimes had to change underwear 2x. Sleeps in a recliner, wakes up with the urge, once it gushed. When she would stand up she would leak Last 3 days were better, has tried not to drink after 7:30, front loaded her water consumption.  Wakes up normally 3 times a night to pee.  Still doing the  exercises Does not have the urge and does not leak in the daytime  Fluid intake: tea,water  PAIN:  Are you having pain? Yes varies by the day- uses estrogen cream 3 times/ week  PRECAUTIONS: None  RED FLAGS: None   WEIGHT BEARING RESTRICTIONS: No  FALLS:  Has patient fallen in last 6 months? No  OCCUPATION: retired  ACTIVITY LEVEL : active  PLOF: Independent  PATIENT GOALS: not to leak  PERTINENT HISTORY:  APPENDECTOMY      COLONOSCOPY  04/2009 Int & Ext Hemorrhoids, L sided diverticula.  [facility]od.  Repeat 5 years   ESOPHAGOGASTRODUODENOSCOPY  04/2009 tiny HH, minimal antritis, gastric polyps. [facility].   EYE SURGERY      MYOMECTOMY   [facility].   SPINE SURGERY N/A  [facility] 12/07/2019   TONSILLECTOMY       Sexual abuse: No  BOWEL MOVEMENT: no issues  URINATION: Pain with urination: No Fully empty bladder: Yes:   Stream: does not know, it depends Urgency: Yes at night Frequency: no Leakage: Urge to void and Walking to the bathroom Pads: Yes: a liner  INTERCOURSE:  Ability to have vaginal penetration Yes  Pain with intercourse: Initial Penetration and During Penetration DrynessYes - uses Good clean love lubricant Climax: yes with a vibrator Marinoff Scale: 1/3 Laxative:no  PREGNANCY: 2 Vaginal deliveries 2   PROLAPSE: Pressure and Bulge, splints for rectocele   OBJECTIVE:  Note: Objective measures were completed at Evaluation unless otherwise noted.  DIAGNOSTIC FINDINGS:  no  PFIQ-7: 14  COGNITION: Overall cognitive status: Within functional limits for tasks assessed     SENSATION: Light touch: Appears intact  LUMBAR SPECIAL TESTS:  Straight leg raise test: Negative    GAIT: Assistive device utilized: None Comments: antalgic  POSTURE: rounded shoulders, forward head, decreased lumbar lordosis, and flexed trunk    LUMBARAROM/PROM:   A/PROM A/PROM  Eval % available  Flexion 75  Extension 50  Right lateral flexion   Left lateral  flexion   Right rotation   Left rotation    (Blank rows = not tested)  LOWER EXTREMITY ROM: appears within functional limitations    LOWER EXTREMITY MMT: within functional limitations, some swelling lat ankles    PALPATION:   General: upper chest breathing strategies  Pelvic Alignment: even  Abdominal: no tenderness                External Perineal Exam: deferred to next visit d/t time                             Internal Pelvic Floor: deferred to next visit d/t time  Patient confirms identification and approves PT to assess internal pelvic floor and treatment Yes  PELVIC MMT:   MMT eval  Vaginal   Internal Anal Sphincter   External Anal Sphincter   Puborectalis   Diastasis Recti   (Blank rows = not tested)        TONE: deferred to next visit d/t  time  PROLAPSE: deferred to next visit d/t time  TODAY'S TREATMENT:                                                                                                                              DATE: 10/30/2023  EVAL see below   PATIENT EDUCATION/ there acts:  Education details: relevant anatomy, mouth gels, decrease water amount some, HEP, keep frontloading water, TTNS  Person educated: Patient Education method: Explanation, Demonstration, Tactile cues, Verbal cues, and Handouts Education comprehension: verbalized understanding, returned demonstration, verbal cues required, tactile cues required, and needs further education  HOME EXERCISE PROGRAM: [id number]  ASSESSMENT:  CLINICAL IMPRESSION: Patient is a 78 y.o. F who was seen today for physical therapy evaluation and treatment for urge incontinence. She was seen here and discharged several months ago in 2/25. She has a cystocele, rectocele and weakness throughout core and will benefit from physical therapy to reduce incontinence. Pt with a lot of back and hip pain as well, active, hydrates probably too much, reported mild anxiety and depression.  OBJECTIVE  IMPAIRMENTS: Abnormal gait, decreased coordination, decreased endurance, decreased strength, and pain.   ACTIVITY LIMITATIONS: continence and toileting  PARTICIPATION LIMITATIONS: interpersonal relationship and community activity  PERSONAL FACTORS: low back pain are also affecting patient's functional outcome.   REHAB POTENTIAL: Good  CLINICAL DECISION MAKING: Evolving/moderate complexity  EVALUATION COMPLEXITY: Moderate   GOALS: Goals reviewed with patient? Yes  SHORT TERM GOALS: Target date: 11/27/2023    Pt will be independent with HEP.   Baseline: Goal status: INITIAL  2.  Pt will be independent with the knack, urge suppression technique, and double voiding in order to improve bladder habits and decrease urinary incontinence.   Baseline:  Goal status: INITIAL  3.  Pt will be able to functional actions such as walking to the bathroom at night without leakage  Baseline:  Goal status: INITIAL  4.  Pt will be independent with use of TTNS Baseline:  Goal status: INITIAL   LONG TERM GOALS: Target date: 01/30/2024  Pt will be independent with advanced HEP.   Baseline:  Goal status: INITIAL  2.  Pt will report 0/10 pain with vaginal penetration in order to improve intimate relationship with partner.    Baseline:  Goal status: INITIAL  3.  Pt will decrease frequency of nightly trips to the bathroom to 1 or less in order to get restful sleep.   Baseline:  Goal status: INITIAL  4.  Pt will soak 0 pads/ night to be able to get restful sleep to have more quality of life and less risk of falls  Baseline:  Goal status: INITIAL   PLAN:  PT FREQUENCY: 1-2x/week  PT DURATION: 12 weeks  PLANNED INTERVENTIONS: 97110-Therapeutic exercises, 97530- Therapeutic activity, 97112- Neuromuscular re-education, 97535- Self Care, 96045- Manual therapy, [phone]- Electrical stimulation (manual), Patient/Family education, Taping, Dry Needling, Joint mobilization, Joint manipulation, Spinal  manipulation, Spinal mobilization, Scar mobilization, Cryotherapy, Moist heat,  and Biofeedback  PLAN FOR NEXT SESSION: internal pelvic floor muscle assessment   Mingo Siegert, PT 10/30/2023, 5:19 PM

## 2023-10-30 ENCOUNTER — Ambulatory Visit: Attending: Physician Assistant | Admitting: Physical Therapy

## 2023-10-30 ENCOUNTER — Encounter: Payer: Self-pay | Admitting: Physical Therapy

## 2023-10-30 DIAGNOSIS — M62838 Other muscle spasm: Secondary | ICD-10-CM

## 2023-10-30 DIAGNOSIS — M6281 Muscle weakness (generalized): Secondary | ICD-10-CM

## 2023-10-30 DIAGNOSIS — R279 Unspecified lack of coordination: Secondary | ICD-10-CM

## 2023-10-31 ENCOUNTER — Other Ambulatory Visit: Payer: Self-pay | Admitting: *Deleted

## 2023-10-31 DIAGNOSIS — M810 Age-related osteoporosis without current pathological fracture: Secondary | ICD-10-CM

## 2023-10-31 MED ORDER — ROMOSOZUMAB-AQQG 105 MG/1.17ML ~~LOC~~ SOSY
210.0000 mg | PREFILLED_SYRINGE | SUBCUTANEOUS | Status: AC
Start: 2023-11-30 — End: 2024-01-28

## 2023-11-23 ENCOUNTER — Ambulatory Visit (INDEPENDENT_AMBULATORY_CARE_PROVIDER_SITE_OTHER): Admitting: Otolaryngology

## 2023-11-23 ENCOUNTER — Encounter (INDEPENDENT_AMBULATORY_CARE_PROVIDER_SITE_OTHER): Payer: Self-pay | Admitting: Otolaryngology

## 2023-11-23 VITALS — BP 120/68 | HR 60 | Ht 62.0 in | Wt 128.5 lb

## 2023-11-23 DIAGNOSIS — J342 Deviated nasal septum: Secondary | ICD-10-CM | POA: Diagnosis not present

## 2023-11-23 DIAGNOSIS — R0981 Nasal congestion: Secondary | ICD-10-CM

## 2023-11-23 DIAGNOSIS — J343 Hypertrophy of nasal turbinates: Secondary | ICD-10-CM | POA: Diagnosis not present

## 2023-11-23 DIAGNOSIS — J310 Chronic rhinitis: Secondary | ICD-10-CM

## 2023-11-25 DIAGNOSIS — J342 Deviated nasal septum: Secondary | ICD-10-CM | POA: Insufficient documentation

## 2023-11-25 DIAGNOSIS — J343 Hypertrophy of nasal turbinates: Secondary | ICD-10-CM | POA: Insufficient documentation

## 2023-11-25 DIAGNOSIS — J310 Chronic rhinitis: Secondary | ICD-10-CM | POA: Insufficient documentation

## 2023-11-25 NOTE — Progress Notes (Signed)
 CC: Chronic nasal obstruction  HPI:  Kristin Tapia is a 78 y.o. female who presents today complaining of chronic nasal obstruction for many years.  The severity of her nasal obstruction has worsened over the past 3 to 4 months.  She has a history of environmental allergies.  She is currently on Atrovent and Flonase.  She uses Flonase sparingly due to her history of glaucoma.  She denies any recent sinusitis.  She has no previous nasal trauma.  She has no previous ENT surgery except for childhood tonsillectomy.  Past Medical History:  Diagnosis Date   Abnormality of gait 03/11/2009   Allergy    immunotherapy/[facility] [place].   Anxiety    Arthritis    Asthma    childhood   Cataract    Cataract    [employer]reesia 12/07/2019   Cavus deformity of foot, acquired 03/11/2009   Cystocele    Detached retina    GERD (gastroesophageal reflux disease)    Hip pain 01/17/2013   Metatarsalgia 03/11/2009   Osteoporosis    Postmenopausal atrophic vaginitis    Rectocele    Restless leg syndrome    L leg; s/p consultation by Dohmeier/Neurology.   Sciatica of left side 01/17/2010   Spinal stenosis    Squamous cell carcinoma of skin 01/25/2015   L shoulder   Thyroid disease     Past Surgical History:  Procedure Laterality Date   APPENDECTOMY     COLONOSCOPY  04/2009   Int & Ext Hemorrhoids, L sided diverticula.  [facility].  Repeat 5 years   ESOPHAGOGASTRODUODENOSCOPY  04/2009   tiny HH, minimal antritis, gastric polyps. [facility].   EYE SURGERY     MYOMECTOMY     [facility].   SPINE SURGERY N/A    [facility] 12/07/2019   TONSILLECTOMY      Family History  Problem Relation Age of Onset   Arthritis Mother        mild   Hypertension Mother    Dementia Mother    Cancer Mother 78       breast cancer   Hyperlipidemia Mother    Heart disease Mother    COPD Father    Stroke Father    Heart disease Father    Seizures Brother    Thyroid cancer Brother    Thyroid disease Brother    Osteoporosis Neg Hx     Bladder Cancer Neg Hx    Uterine cancer Neg Hx     Social History:  reports that she has never smoked. She has never used smokeless tobacco. She reports that she does not drink alcohol and does not use drugs.  Allergies:  Allergies  Allergen Reactions   Sulfonamide Derivatives Anaphylaxis    Prior to Admission medications   Medication Sig Start Date End Date Taking? Authorizing Provider  ACETAMINOPHEN PO Take by mouth daily.   Yes [provider]  ALPRAZolam  (XANAX ) 0.5 MG tablet Take 0.5 tablets (0.25 mg total) by mouth at bedtime as needed for anxiety. 12/09/19  Yes Elyce Hams, Marguerita Shih, MD  Calcium-Magnesium-Vitamin D  (CALCIUM 1200+D3 PO) 1 tablet daily. Calcium 650mg  , Vit d mcg 05/10/18  Yes [provider]  Cholecalciferol (VITAMIN D3 PO) Take 25 mcg by mouth.   Yes [provider]  Cranberry (ELLURA) 200 MG CAPS  11/10/17  Yes [provider]  diclofenac  Sodium (VOLTAREN ) 1 % GEL Apply 2 g topically 4 (four) times daily. 4 times a day; 2 grams 2-4 times daily   Yes [provider]  EPINEPHrine 0.3 mg/0.3 mL IJ SOAJ injection  12/04/18  Yes [provider]  estradiol  (ESTRACE ) 0.1 MG/GM vaginal cream Use 1g nightly for 2 weeks, followed by 1g 3 times a week 08/28/23  Yes Wyonia Hefty T, MD  famotidine  (PEPCID ) 20 MG tablet Take by mouth as needed.   Yes [provider]  fexofenadine (ALLEGRA) 180 MG tablet Take 180 mg by mouth every morning.   Yes [provider]  fluticasone (FLONASE) 50 MCG/ACT nasal spray 1 spray. 09/25/23  Yes [provider]  Gabapentin  Enacarbil ER (HORIZANT) 300 MG TBCR Take by mouth. Takes 1/2 in the am & 1 tablet in the pm   Yes [provider]  ipratropium (ATROVENT) 0.03 % nasal spray Place 2 sprays into the nose 3 (three) times daily.   Yes [provider]  latanoprost (XALATAN) 0.005 % ophthalmic solution  01/15/20  Yes [provider]  levocetirizine  (XYZAL ALLERGY 24HR) 5 MG tablet  10/21/23  Yes [provider]  lidocaine  (XYLOCAINE ) 5 % ointment Use 0.5g (peasize) up to 3 times a day or 15-71min prior to intercourse 08/28/23  Yes Wyonia Hefty T, MD  MAGNESIUM GLYCINATE PLUS PO Take 400 mg by mouth.   Yes [provider]  Menaquinone-7 (VITAMIN K2) 100 MCG CAPS  05/26/20  Yes [provider]  Omega-3 Fatty Acids (FISH OIL) 1200 MG CAPS Take 1,200 mg by mouth daily. Patient takes 2 gel caps daily   Yes [provider]  OVER THE COUNTER MEDICATION Xlelair   Yes [provider]  Romosozumab -aqqg (EVENITY ) 105 MG/1.17ML SOSY injection Inject 210 mg into the skin.   Yes [provider]  timolol (BETIMOL) 0.5 % ophthalmic solution 1 drop 2 (two) times daily. One drop both eyes every morning   Yes [provider]    Blood pressure 120/68, pulse 60, height 5\' 2"  (1.575 m), weight 128 lb 8 oz (58.3 kg), SpO2 94%. Exam: General: Communicates without difficulty, well nourished, no acute distress. Head: Normocephalic, no evidence injury, no tenderness, facial buttresses intact without stepoff. Face/sinus: No tenderness to palpation and percussion. Facial movement is normal and symmetric. Eyes: PERRL, EOMI. No scleral icterus, conjunctivae clear. Neuro: CN II exam reveals vision grossly intact.  No nystagmus at any point of gaze. Ears: Auricles well formed without lesions.  Ear canals are intact without mass or lesion.  No erythema or edema is appreciated.  The TMs are intact without fluid. Nose: External evaluation reveals normal support and skin without lesions.  Dorsum is intact.  Anterior rhinoscopy reveals congested mucosa over anterior aspect of inferior turbinates and deviated septum.  No purulence noted. Oral:  Oral cavity and oropharynx are intact, symmetric, without erythema or edema.  Mucosa is moist without lesions. Neck: Full range of motion without pain.  There is no significant  lymphadenopathy.  No masses palpable.  Thyroid bed within normal limits to palpation.  Parotid glands and submandibular glands equal bilaterally without mass.  Trachea is midline. Neuro:  CN 2-12 grossly intact.   Procedure:  Flexible Nasal Endoscopy: Description: Risks, benefits, and alternatives of flexible endoscopy were explained to the patient.  Specific mention was made of the risk of throat numbness with difficulty swallowing, possible bleeding from the nose and mouth, and pain from the procedure.  The patient gave oral consent to proceed.  The flexible scope was inserted into the right nasal cavity.  Endoscopy of the interior nasal cavity, superior, inferior, and middle  meatus was performed. The sphenoid-ethmoid recess was examined. Edematous mucosa was noted.  No polyp, mass, or lesion was appreciated. Nasal septal deviation noted. Olfactory cleft was clear.  Nasopharynx was clear.  Turbinates were hypertrophied but without mass.  The procedure was repeated on the contralateral side with similar findings.  The patient tolerated the procedure well.   Assessment: 1.  Chronic rhinitis with nasal mucosal congestion, nasal septal deviation, and bilateral inferior turbinate hypertrophy. 2.  No polyps, mass, lesion, or purulent drainage is noted today.  Plan: 1.  The physical exam and nasal endoscopy findings are reviewed with the patient. 2.  Continue with Atrovent and Flonase nasal sprays daily.  The patient will follow-up with her ophthalmologist regarding the appropriate use of Flonase. 3.  The option of septoplasty and turbinate reduction surgery to treat her chronic nasal obstruction is also discussed. 4.  The patient will return for reevaluation in 6 months, sooner if needed.  [place] Kestner W Faheem Ziemann 11/25/2023, 2:45 PM

## 2023-11-30 ENCOUNTER — Ambulatory Visit

## 2023-12-07 ENCOUNTER — Ambulatory Visit: Payer: Self-pay | Attending: Obstetrics | Admitting: Physical Therapy

## 2023-12-07 ENCOUNTER — Encounter: Payer: Self-pay | Admitting: Physical Therapy

## 2023-12-07 DIAGNOSIS — M6281 Muscle weakness (generalized): Secondary | ICD-10-CM | POA: Diagnosis present

## 2023-12-07 DIAGNOSIS — R279 Unspecified lack of coordination: Secondary | ICD-10-CM | POA: Diagnosis present

## 2023-12-07 DIAGNOSIS — M62838 Other muscle spasm: Secondary | ICD-10-CM | POA: Insufficient documentation

## 2023-12-07 NOTE — Therapy (Signed)
 OUTPATIENT PHYSICAL THERAPY FEMALE PELVIC TREATMENT   Patient Name: Kristin Tapia MRN: [id number] DOB:1945-10-29, 78 y.o., female Today's Date: 12/07/2023  END OF SESSION:  PT End of Session - 12/07/23 0853     Visit Number 2    Date for PT Re-Evaluation 01/30/24    Authorization Type [employer] Approved 10 visits-10/30/2023-01/30/2024-auth#[id number]    Authorization Time Period [employer] Approved 10 visits-10/30/2023-01/30/2024-auth#[id number]    Authorization - Visit Number 2    PT Start Time 0850    PT Stop Time 0930    PT Time Calculation (min) 40 min    Activity Tolerance Patient tolerated treatment well    Behavior During Therapy [facility] for tasks assessed/performed           Past Medical History:  Diagnosis Date   Abnormality of gait 03/11/2009   Allergy    immunotherapy/[facility] [place].   Anxiety    Arthritis    Asthma    childhood   Cataract    Cataract    [employer] 12/07/2019   Cavus deformity of foot, acquired 03/11/2009   Cystocele    Detached retina    GERD (gastroesophageal reflux disease)    Hip pain 01/17/2013   Metatarsalgia 03/11/2009   Osteoporosis    Postmenopausal atrophic vaginitis    Rectocele    Restless leg syndrome    L leg; s/p consultation by Dohmeier/Neurology.   Sciatica of left side 01/17/2010   Spinal stenosis    Squamous cell carcinoma of skin 01/25/2015   L shoulder   Thyroid disease    Past Surgical History:  Procedure Laterality Date   APPENDECTOMY     COLONOSCOPY  04/2009   Int & Ext Hemorrhoids, L sided diverticula.  [facility].  Repeat 5 years   ESOPHAGOGASTRODUODENOSCOPY  04/2009   tiny HH, minimal antritis, gastric polyps. [facility].   EYE SURGERY     MYOMECTOMY     [place].   SPINE SURGERY N/A    [employer] 12/07/2019   TONSILLECTOMY     Patient Active Problem List   Diagnosis Date Noted   Chronic rhinitis 11/25/2023   Deviated nasal septum 11/25/2023   Hypertrophy of nasal turbinates 11/25/2023   Constipation 10/23/2023   Acute  right ankle pain 09/19/2023   Urge urinary incontinence 08/28/2023   Dyspareunia, female 08/28/2023   Abnormal urinalysis 08/28/2023   Pelvic organ prolapse quantification stage 1 rectocele 08/28/2023   Plantar fasciitis of left foot 02/22/2022   Spinal stenosis at L4-L5 level 01/09/2020   Increased pressure in the eye, right 01/09/2020   S/P cervical spinal fusion 12/09/2019   Cervical radiculopathy 10/20/2019   Metatarsalgia of right foot 09/03/2018   Degenerative lumbar disc 11/02/2016   Psychophysiological insomnia 06/26/2016   Atrophic vaginitis 05/04/2016   Vaginal moniliasis 05/04/2016   Seasonal allergic rhinitis due to pollen 05/09/2015   Asthma, mild intermittent 05/09/2015   Tendinopathy of right rotator cuff 12/10/2014   Primary osteoarthritis involving multiple joints 05/23/2014   Osteopenia 05/23/2014   Right tennis elbow 03/05/2014   Pain in joint, ankle and foot 05/06/2013   Left hip pain 01/17/2013   Sciatica of left side 01/17/2010   Enthesopathy of ankle and tarsus 03/11/2009   Cavus deformity of foot 03/11/2009    PCP: Glen Land, PA-C  REFERRING PROVIDER:  Darlene Ehlers, MD  REFERRING DIAG: N81.6 (ICD-10-CM) - Pelvic organ prolapse quantification stage 1 rectocele N94.10 (ICD-10-CM) - Dyspareunia, female N39.41 (ICD-10-CM) - Urge urinary incontinence Nocturia   THERAPY DIAG:  Muscle weakness (  generalized)  Other muscle spasm  Unspecified lack of coordination  Rationale for Evaluation and Treatment: Rehabilitation  ONSET DATE: 2015  SUBJECTIVE:                                                                                                                                                                                           SUBJECTIVE STATEMENT: Pt coming back to PT about 5 weeks after eval. She said that she has  had some leaks, Only one was during the daytime, once she stood up and leaked at night.  Reports consistency with exercise.   She is bringing her TENS unit for TTNS She is trying to not drink after dinner 6;30, 6:45. Still trying to front load her water consumption. Wakes up with dry mouth, takes dry mouth lozenges.  On average wakes up 2-5 times/ night, sometimes d/t pain all over.  Ankles, feet, toes. Since five weeks ago, she only leaked once during the daytime, a teaspoon, when she was getting close to the bathroom.  Pain is waking her up at night   Fluid intake: tea,water  PAIN:  Are you having pain? Yes varies by the day- uses estrogen cream 3 times/ week  PRECAUTIONS: None  RED FLAGS: None   WEIGHT BEARING RESTRICTIONS: No  FALLS:  Has patient fallen in last 6 months? No  OCCUPATION: retired  ACTIVITY LEVEL : active  PLOF: Independent  PATIENT GOALS: not to leak  PERTINENT HISTORY:  APPENDECTOMY      COLONOSCOPY  04/2009 Int & Ext Hemorrhoids, L sided diverticula.  [facility].  Repeat 5 years   ESOPHAGOGASTRODUODENOSCOPY  04/2009 tiny HH, minimal antritis, gastric polyps. [facility].   EYE SURGERY      MYOMECTOMY   [facility].   SPINE SURGERY N/A  [employer]reesia 12/07/2019   TONSILLECTOMY       Sexual abuse: No  BOWEL MOVEMENT: no issues  URINATION: Pain with urination: No Fully empty bladder: Yes:   Stream: does not know, it depends Urgency: Yes at night Frequency: no Leakage: Urge to void and Walking to the bathroom Pads: Yes: a liner  INTERCOURSE:  Ability to have vaginal penetration Yes  Pain with intercourse: Initial Penetration and During Penetration DrynessYes - uses Good clean love lubricant Climax: yes with a vibrator Marinoff Scale: 1/3 Laxative:no  PREGNANCY: 2 Vaginal deliveries 2   PROLAPSE: Pressure and Bulge, splints for rectocele   OBJECTIVE:  Note: Objective measures were completed at Evaluation unless otherwise noted.  DIAGNOSTIC FINDINGS:  no  PFIQ-7: 14  COGNITION: Overall cognitive status: Within functional limits for tasks  assessed     SENSATION: Light touch: Appears  intact  LUMBAR SPECIAL TESTS:  Straight leg raise test: Negative    GAIT: Assistive device utilized: None Comments: antalgic  POSTURE: rounded shoulders, forward head, decreased lumbar lordosis, and flexed trunk    LUMBARAROM/PROM:   A/PROM A/PROM  Eval % available  Flexion 75  Extension 50  Right lateral flexion   Left lateral flexion   Right rotation   Left rotation    (Blank rows = not tested)  LOWER EXTREMITY ROM: appears within functional limitations    LOWER EXTREMITY MMT: within functional limitations, some swelling lat ankles    PALPATION:   General: upper chest breathing strategies  Pelvic Alignment: even  Abdominal: no tenderness                External Perineal Exam: deferred to next visit d/t time                             Internal Pelvic Floor: deferred to next visit d/t time  Patient confirms identification and approves PT to assess internal pelvic floor and treatment Yes  PELVIC MMT:   MMT eval  Vaginal   Internal Anal Sphincter   External Anal Sphincter   Puborectalis   Diastasis Recti   (Blank rows = not tested)        TONE: deferred to next visit d/t time  PROLAPSE: deferred to next visit d/t time  TODAY'S TREATMENT:                                                                                                                              DATE: 12/07/2023  EVAL see below  There acts- Trial of transcutaneous tibial nerve stimulation for urinary urgency. Pt educated on electrode placement near medial malleoli bilateral feet and electrodes placed. Tens unit turned up until strong and comfortable sensation was felt in bilateral feet and sensation of toes curling and then turned down. Current ran for 20 mins. Pt was educated on turning off unit, electrode cleaning and storing and protocol was given for self care at home. Pt to set up unit per protocol and reach out if help is needed with  adjusting parameters to 200 HZ and 20 ns.  Review of goals and progress     PATIENT EDUCATION/ there acts:  Education details: relevant anatomy, mouth gels, decrease water amount some, HEP, keep frontloading water, TTNS  Person educated: Patient Education method: Explanation, Demonstration, Tactile cues, Verbal cues, and Handouts Education comprehension: verbalized understanding, returned demonstration, verbal cues required, tactile cues required, and needs further education  HOME EXERCISE PROGRAM: [email or web address]  ASSESSMENT:  CLINICAL IMPRESSION: Pt did well with trail of transcutaneous nerve stimulation for urinary urgency, She wakes up at night d/t back and foot pain and sometimes has urinary leakage. She sees a pain management specialist, will go to a rheumatologist. No leaking during the day. Hydrates the first part of the day so she  does not have to pee at night.  She will benefit from PT to address deficits.   OBJECTIVE IMPAIRMENTS: Abnormal gait, decreased coordination, decreased endurance, decreased strength, and pain.   ACTIVITY LIMITATIONS: continence and toileting  PARTICIPATION LIMITATIONS: interpersonal relationship and community activity  PERSONAL FACTORS: low back pain are also affecting patient's functional outcome.   REHAB POTENTIAL: Good  CLINICAL DECISION MAKING: Evolving/moderate complexity  EVALUATION COMPLEXITY: Moderate   GOALS: Goals reviewed with patient? Yes  SHORT TERM GOALS: Target date: 11/27/2023    Pt will be independent with HEP.   Baseline: Goal status: INITIAL  2.  Pt will be independent with the knack, urge suppression technique, and double voiding in order to improve bladder habits and decrease urinary incontinence.   Baseline:  Goal status: INITIAL  3.  Pt will be able to functional actions such as walking to the bathroom at night without leakage  Baseline:  Goal status: INITIAL  4.  Pt will be independent with use of TTNS Baseline:   Goal status: INITIAL   LONG TERM GOALS: Target date: 01/30/2024  Pt will be independent with advanced HEP.   Baseline:  Goal status: INITIAL  2.  Pt will report 0/10 pain with vaginal penetration in order to improve intimate relationship with partner.    Baseline:  Goal status: INITIAL  3.  Pt will decrease frequency of nightly trips to the bathroom to 1 or less in order to get restful sleep.   Baseline:  Goal status: INITIAL  4.  Pt will soak 0 pads/ night to be able to get restful sleep to have more quality of life and less risk of falls  Baseline:  Goal status: INITIAL   PLAN:  PT FREQUENCY: 1-2x/week  PT DURATION: 12 weeks  PLANNED INTERVENTIONS: 97110-Therapeutic exercises, 97530- Therapeutic activity, 97112- Neuromuscular re-education, 97535- Self Care, 09811- Manual therapy, [phone]- Electrical stimulation (manual), Patient/Family education, Taping, Dry Needling, Joint mobilization, Joint manipulation, Spinal manipulation, Spinal mobilization, Scar mobilization, Cryotherapy, Moist heat, and Biofeedback  PLAN FOR NEXT SESSION: internal pelvic floor muscle assessment, education on bladder diary   Kelli Robeck, PT 12/07/2023, 8:55 AM

## 2023-12-13 ENCOUNTER — Encounter: Payer: Self-pay | Admitting: Physical Therapy

## 2023-12-13 ENCOUNTER — Ambulatory Visit: Admitting: Physical Therapy

## 2023-12-13 DIAGNOSIS — R279 Unspecified lack of coordination: Secondary | ICD-10-CM

## 2023-12-13 DIAGNOSIS — M62838 Other muscle spasm: Secondary | ICD-10-CM

## 2023-12-13 DIAGNOSIS — M6281 Muscle weakness (generalized): Secondary | ICD-10-CM | POA: Diagnosis not present

## 2023-12-13 NOTE — Therapy (Signed)
 OUTPATIENT PHYSICAL THERAPY FEMALE PELVIC TREATMENT   Patient Name: Kristin Tapia MRN: [id number] DOB:1945-08-30, 78 y.o., female Today's Date: 12/13/2023  END OF SESSION:  PT End of Session - 12/13/23 0851     Visit Number 3    Date for PT Re-Evaluation 01/30/24    Authorization Type [employer] Approved 10 visits-10/30/2023-01/30/2024-auth#[id number]    Authorization Time Period [employer] Approved 10 visits-10/30/2023-01/30/2024-auth#[id number]    Authorization - Visit Number 3    Progress Note Due on Visit 10    PT Start Time 0850    PT Stop Time 0930    PT Time Calculation (min) 40 min    Activity Tolerance Patient tolerated treatment well    Behavior During Therapy [facility] for tasks assessed/performed            Past Medical History:  Diagnosis Date   Abnormality of gait 03/11/2009   Allergy    immunotherapy/[facility] [place].   Anxiety    Arthritis    Asthma    childhood   Cataract    Cataract    [employer]reesia 12/07/2019   Cavus deformity of foot, acquired 03/11/2009   Cystocele    Detached retina    GERD (gastroesophageal reflux disease)    Hip pain 01/17/2013   Metatarsalgia 03/11/2009   Osteoporosis    Postmenopausal atrophic vaginitis    Rectocele    Restless leg syndrome    L leg; s/p consultation by Dohmeier/Neurology.   Sciatica of left side 01/17/2010   Spinal stenosis    Squamous cell carcinoma of skin 01/25/2015   L shoulder   Thyroid disease    Past Surgical History:  Procedure Laterality Date   APPENDECTOMY     COLONOSCOPY  04/2009   Int & Ext Hemorrhoids, L sided diverticula.  [facility].  Repeat 5 years   ESOPHAGOGASTRODUODENOSCOPY  04/2009   tiny HH, minimal antritis, gastric polyps. [facility].   EYE SURGERY     MYOMECTOMY     [facility].   SPINE SURGERY N/A    [employer] 12/07/2019   TONSILLECTOMY     Patient Active Problem List   Diagnosis Date Noted   Chronic rhinitis 11/25/2023   Deviated nasal septum 11/25/2023   Hypertrophy of nasal turbinates 11/25/2023    Constipation 10/23/2023   Acute right ankle pain 09/19/2023   Urge urinary incontinence 08/28/2023   Dyspareunia, female 08/28/2023   Abnormal urinalysis 08/28/2023   Pelvic organ prolapse quantification stage 1 rectocele 08/28/2023   Plantar fasciitis of left foot 02/22/2022   Spinal stenosis at L4-L5 level 01/09/2020   Increased pressure in the eye, right 01/09/2020   S/P cervical spinal fusion 12/09/2019   Cervical radiculopathy 10/20/2019   Metatarsalgia of right foot 09/03/2018   Degenerative lumbar disc 11/02/2016   Psychophysiological insomnia 06/26/2016   Atrophic vaginitis 05/04/2016   Vaginal moniliasis 05/04/2016   Seasonal allergic rhinitis due to pollen 05/09/2015   Asthma, mild intermittent 05/09/2015   Tendinopathy of right rotator cuff 12/10/2014   Primary osteoarthritis involving multiple joints 05/23/2014   Osteopenia 05/23/2014   Right tennis elbow 03/05/2014   Pain in joint, ankle and foot 05/06/2013   Left hip pain 01/17/2013   Sciatica of left side 01/17/2010   Enthesopathy of ankle and tarsus 03/11/2009   Cavus deformity of foot 03/11/2009    PCP: Glen Land, PA-C  REFERRING PROVIDER:  Darlene Ehlers, MD  REFERRING DIAG: N81.6 (ICD-10-CM) - Pelvic organ prolapse quantification stage 1 rectocele N94.10 (ICD-10-CM) - Dyspareunia, female N39.41 (ICD-10-CM) - Urge  urinary incontinence Nocturia   THERAPY DIAG:  Muscle weakness (generalized)  Unspecified lack of coordination  Other muscle spasm  Rationale for Evaluation and Treatment: Rehabilitation  ONSET DATE: 2015  SUBJECTIVE:                                                                                                                                                                                           SUBJECTIVE STATEMENT:  Pt reports that she has been using her TTNS ( transcutaneous tibial nerve stimulation). She has had one leak and soak a liner since she started the electrical  stimulation.She is getting faster at attaching the electrodes Has pain all over, planning on doing blood works and seeing a rheumatologist.  Feels like she has something going on system wide.  She is figuring out the intensity of the unit and how it works with her body. How it responds.    Last session Pt coming back to PT about 5 weeks after eval. She said that she has  had some leaks, Only one was during the daytime, once she stood up and leaked at night.  Reports consistency with exercise.  She is bringing her TENS unit for TTNS She is trying to not drink after dinner 6;30, 6:45. Still trying to front load her water consumption. Wakes up with dry mouth, takes dry mouth lozenges.  On average wakes up 2-5 times/ night, sometimes d/t pain all over.  Ankles, feet, toes. Since five weeks ago, she only leaked once during the daytime, a teaspoon, when she was getting close to the bathroom.  Pain is waking her up at night   Fluid intake: tea,water  PAIN:  Are you having pain? Yes varies by the day- uses estrogen cream 3 times/ week  PRECAUTIONS: None  RED FLAGS: None   WEIGHT BEARING RESTRICTIONS: No  FALLS:  Has patient fallen in last 6 months? No  OCCUPATION: retired  ACTIVITY LEVEL : active  PLOF: Independent  PATIENT GOALS: not to leak  PERTINENT HISTORY:  APPENDECTOMY      COLONOSCOPY  04/2009 Int & Ext Hemorrhoids, L sided diverticula.  [facility].  Repeat 5 years   ESOPHAGOGASTRODUODENOSCOPY  04/2009 tiny HH, minimal antritis, gastric polyps. [facility].   EYE SURGERY      MYOMECTOMY   [place].   SPINE SURGERY N/A  [employer] 12/07/2019   TONSILLECTOMY       Sexual abuse: No  BOWEL MOVEMENT: no issues  URINATION: Pain with urination: No Fully empty bladder: Yes:   Stream: does not know, it depends Urgency: Yes at night Frequency: no Leakage: Urge to void and Walking to the bathroom  Pads: Yes: a liner  INTERCOURSE:  Ability to have vaginal penetration Yes   Pain with intercourse: Initial Penetration and During Penetration DrynessYes - uses Good clean love lubricant Climax: yes with a vibrator Marinoff Scale: 1/3 Laxative:no  PREGNANCY: 2 Vaginal deliveries 2   PROLAPSE: Pressure and Bulge, splints for rectocele   OBJECTIVE:  Note: Objective measures were completed at Evaluation unless otherwise noted.  DIAGNOSTIC FINDINGS:  no  PFIQ-7: 14  COGNITION: Overall cognitive status: Within functional limits for tasks assessed     SENSATION: Light touch: Appears intact  LUMBAR SPECIAL TESTS:  Straight leg raise test: Negative    GAIT: Assistive device utilized: None Comments: antalgic  POSTURE: rounded shoulders, forward head, decreased lumbar lordosis, and flexed trunk    LUMBARAROM/PROM:   A/PROM A/PROM  Eval % available  Flexion 75  Extension 50  Right lateral flexion   Left lateral flexion   Right rotation   Left rotation    (Blank rows = not tested)  LOWER EXTREMITY ROM: appears within functional limitations    LOWER EXTREMITY MMT: within functional limitations, some swelling lat ankles    PALPATION:   General: upper chest breathing strategies  Pelvic Alignment: even  Abdominal: no tenderness                External Perineal Exam: tissues are pink, not dry, decreased clitoral hood mobility some                             Internal Pelvic Floor: some tenderness left bulbocavernosus, decreases movement of clitoral hood, able to contract, relax, had difficulty with bulging but able to do it with VC's  Patient confirms identification and approves PT to assess internal pelvic floor and treatment Yes  PELVIC MMT:   MMT eval  Vaginal 2/5, 4 reps, 20 sec  Internal Anal Sphincter   External Anal Sphincter   Puborectalis   Diastasis Recti no  (Blank rows = not tested)        TONE: low  PROLAPSE: Mild anterior and posterior vaginal laxity, some descent with cough    TODAY'S TREATMENT:                                                                                                                               DATE:   12/13/2023 Neuro reed- review of her HEP and exercises demonstrated There acts- internal muscle assessment and manual feedback for pelvic floor contraction and lengthening   12/07/2023    There acts- Trial of transcutaneous tibial nerve stimulation for urinary urgency. Pt educated on electrode placement near medial malleoli bilateral feet and electrodes placed. Tens unit turned up until strong and comfortable sensation was felt in bilateral feet and sensation of toes curling and then turned down. Current ran for 20 mins. Pt was educated on turning off unit, electrode cleaning and storing and protocol was given for self  care at home. Pt to set up unit per protocol and reach out if help is needed with adjusting parameters to 200 HZ and 20 ns.  Review of goals and progress     PATIENT EDUCATION/ there acts:  Education details: relevant anatomy, mouth gels, decrease water amount some, HEP, keep frontloading water, TTNS  Person educated: Patient Education method: Explanation, Demonstration, Tactile cues, Verbal cues, and Handouts Education comprehension: verbalized understanding, returned demonstration, verbal cues required, tactile cues required, and needs further education  HOME EXERCISE PROGRAM: [email or web address]  ASSESSMENT:  CLINICAL IMPRESSION: Pt overall improving and progressing towards goals.  Had a little difficulty with lengthening her pelvic floor, improved tissue dryness. Still some pelvic flor weakness but overall good coordination, some tenderness left bulbocavernosus. Tried lengthening with breathing into fist and gentle bulging- some difficulty with that.  She will benefit from PT to address deficits.   OBJECTIVE IMPAIRMENTS: Abnormal gait, decreased coordination, decreased endurance, decreased strength, and pain.   ACTIVITY LIMITATIONS: continence and  toileting  PARTICIPATION LIMITATIONS: interpersonal relationship and community activity  PERSONAL FACTORS: low back pain are also affecting patient's functional outcome.   REHAB POTENTIAL: Good  CLINICAL DECISION MAKING: Evolving/moderate complexity  EVALUATION COMPLEXITY: Moderate   GOALS: Goals reviewed with patient? Yes  SHORT TERM GOALS: Target date: 11/27/2023    Pt will be independent with HEP.   Baseline: Goal status: met  2.  Pt will be independent with the knack, urge suppression technique, and double voiding in order to improve bladder habits and decrease urinary incontinence.   Baseline:  Goal status: INITIAL  3.  Pt will be able to functional actions such as walking to the bathroom at night without leakage  Baseline:  Goal status: INITIAL  4.  Pt will be independent with use of TTNS Baseline:  Goal status: met   LONG TERM GOALS: Target date: 01/30/2024  Pt will be independent with advanced HEP.   Baseline:  Goal status: INITIAL  2.  Pt will report 0/10 pain with vaginal penetration in order to improve intimate relationship with partner.    Baseline:  Goal status: INITIAL  3.  Pt will decrease frequency of nightly trips to the bathroom to 1 or less in order to get restful sleep.   Baseline:  Goal status: INITIAL  4.  Pt will soak 0 pads/ night to be able to get restful sleep to have more quality of life and less risk of falls  Baseline:  Goal status: INITIAL   PLAN:  PT FREQUENCY: 1-2x/week  PT DURATION: 12 weeks  PLANNED INTERVENTIONS: 97110-Therapeutic exercises, 97530- Therapeutic activity, 97112- Neuromuscular re-education, 97535- Self Care, 91478- Manual therapy, [phone]- Electrical stimulation (manual), Patient/Family education, Taping, Dry Needling, Joint mobilization, Joint manipulation, Spinal manipulation, Spinal mobilization, Scar mobilization, Cryotherapy, Moist heat, and Biofeedback  PLAN FOR NEXT SESSION:  education on bladder diary  and irritants, urge suppression strategies   Lovada Barwick, PT 12/13/2023, 9:24 AM

## 2023-12-21 ENCOUNTER — Ambulatory Visit: Admitting: Physical Therapy

## 2023-12-25 ENCOUNTER — Encounter: Payer: Self-pay | Admitting: Physical Therapy

## 2023-12-25 ENCOUNTER — Ambulatory Visit: Attending: Obstetrics | Admitting: Physical Therapy

## 2023-12-25 DIAGNOSIS — M6281 Muscle weakness (generalized): Secondary | ICD-10-CM | POA: Insufficient documentation

## 2023-12-25 DIAGNOSIS — R279 Unspecified lack of coordination: Secondary | ICD-10-CM | POA: Diagnosis present

## 2023-12-25 DIAGNOSIS — M62838 Other muscle spasm: Secondary | ICD-10-CM | POA: Diagnosis present

## 2023-12-25 NOTE — Therapy (Signed)
 OUTPATIENT PHYSICAL THERAPY FEMALE PELVIC TREATMENT   Patient Name: Kristin Tapia MRN: [id number] DOB:10-06-1945, 78 y.o., female Today's Date: 12/25/2023  END OF SESSION:  PT End of Session - 12/25/23 0927     Visit Number 4    Date for PT Re-Evaluation 01/30/24    Authorization Type [employer] Approved 10 visits-10/30/2023-01/30/2024-auth#[id number]    Authorization Time Period [employer] Approved 10 visits-10/30/2023-01/30/2024-auth#[id number]    Authorization - Visit Number 4    PT Start Time 0927    PT Stop Time 1010    PT Time Calculation (min) 43 min    Activity Tolerance Patient tolerated treatment well    Behavior During Therapy [facility] for tasks assessed/performed             Past Medical History:  Diagnosis Date   Abnormality of gait 03/11/2009   Allergy    immunotherapy/[facility] [place].   Anxiety    Arthritis    Asthma    childhood   Cataract    Cataract    [employer] 12/07/2019   Cavus deformity of foot, acquired 03/11/2009   Cystocele    Detached retina    GERD (gastroesophageal reflux disease)    Hip pain 01/17/2013   Metatarsalgia 03/11/2009   Osteoporosis    Postmenopausal atrophic vaginitis    Rectocele    Restless leg syndrome    L leg; s/p consultation by [facility]/Neurology.   Sciatica of left side 01/17/2010   Spinal stenosis    Squamous cell carcinoma of skin 01/25/2015   L shoulder   Thyroid disease    Past Surgical History:  Procedure Laterality Date   APPENDECTOMY     COLONOSCOPY  04/2009   Int & Ext Hemorrhoids, L sided diverticula.  [facility].  Repeat 5 years   ESOPHAGOGASTRODUODENOSCOPY  04/2009   tiny HH, minimal antritis, gastric polyps. [facility].   EYE SURGERY     MYOMECTOMY     [facility].   SPINE SURGERY N/A    [employer] 12/07/2019   TONSILLECTOMY     Patient Active Problem List   Diagnosis Date Noted   Chronic rhinitis 11/25/2023   Deviated nasal septum 11/25/2023   Hypertrophy of nasal turbinates 11/25/2023   Constipation 10/23/2023    Acute right ankle pain 09/19/2023   Urge urinary incontinence 08/28/2023   Dyspareunia, female 08/28/2023   Abnormal urinalysis 08/28/2023   Pelvic organ prolapse quantification stage 1 rectocele 08/28/2023   Plantar fasciitis of left foot 02/22/2022   Spinal stenosis at L4-L5 level 01/09/2020   Increased pressure in the eye, right 01/09/2020   S/P cervical spinal fusion 12/09/2019   Cervical radiculopathy 10/20/2019   Metatarsalgia of right foot 09/03/2018   Degenerative lumbar disc 11/02/2016   Psychophysiological insomnia 06/26/2016   Atrophic vaginitis 05/04/2016   Vaginal moniliasis 05/04/2016   Seasonal allergic rhinitis due to pollen 05/09/2015   Asthma, mild intermittent 05/09/2015   Tendinopathy of right rotator cuff 12/10/2014   Primary osteoarthritis involving multiple joints 05/23/2014   Osteopenia 05/23/2014   Right tennis elbow 03/05/2014   Pain in joint, ankle and foot 05/06/2013   Left hip pain 01/17/2013   Sciatica of left side 01/17/2010   Enthesopathy of ankle and tarsus 03/11/2009   Cavus deformity of foot 03/11/2009    PCP: Allen Lauraine CROME, PA-C  REFERRING PROVIDER:  Guadlupe Lianne DASEN, MD  REFERRING DIAG: N81.6 (ICD-10-CM) - Pelvic organ prolapse quantification stage 1 rectocele N94.10 (ICD-10-CM) - Dyspareunia, female N39.41 (ICD-10-CM) - Urge urinary incontinence Nocturia   THERAPY DIAG:  Muscle weakness (generalized)  Other muscle spasm  Unspecified lack of coordination  Rationale for Evaluation and Treatment: Rehabilitation  ONSET DATE: 2015  SUBJECTIVE:                                                                                                                                                                                           SUBJECTIVE STATEMENT: Pt reports that she used then TENS unit for a week and felt really good then.  Her PCP prescribed her Xanax  and when she would get up, have urge and leak. Feels like she is not getting much  more sleep 7-8 mins Normally it is pain what wakes her up, she will wake up and walk around for up to 2 hours  She does not have sleep apnea, not using a c pap Feels like the tens unit is is helping Filled out a bladder diary from years ago.  Reports that exercises are going well    Fluid intake: tea,water  PAIN:  Are you having pain? Yes varies by the day- uses estrogen cream 3 times/ week  PRECAUTIONS: None  RED FLAGS: None   WEIGHT BEARING RESTRICTIONS: No  FALLS:  Has patient fallen in last 6 months? No  OCCUPATION: retired  ACTIVITY LEVEL : active  PLOF: Independent  PATIENT GOALS: not to leak  PERTINENT HISTORY:  APPENDECTOMY      COLONOSCOPY  04/2009 Int & Ext Hemorrhoids, L sided diverticula.  [facility].  Repeat 5 years   ESOPHAGOGASTRODUODENOSCOPY  04/2009 tiny HH, minimal antritis, gastric polyps. [facility].   EYE SURGERY      MYOMECTOMY   [facility].   SPINE SURGERY N/A  [employer] 12/07/2019   TONSILLECTOMY       Sexual abuse: No  BOWEL MOVEMENT: no issues  URINATION: Pain with urination: No Fully empty bladder: Yes:   Stream: does not know, it depends Urgency: Yes at night Frequency: no Leakage: Urge to void and Walking to the bathroom Pads: Yes: a liner  INTERCOURSE:  Ability to have vaginal penetration Yes  Pain with intercourse: Initial Penetration and During Penetration DrynessYes - uses Good clean love lubricant Climax: yes with a vibrator Marinoff Scale: 1/3 Laxative:no  PREGNANCY: 2 Vaginal deliveries 2   PROLAPSE: Pressure and Bulge, splints for rectocele   OBJECTIVE:  Note: Objective measures were completed at Evaluation unless otherwise noted.  DIAGNOSTIC FINDINGS:  no  PFIQ-7: 14  COGNITION: Overall cognitive status: Within functional limits for tasks assessed     SENSATION: Light touch: Appears intact  LUMBAR SPECIAL TESTS:  Straight leg raise test: Negative    GAIT: Assistive device utilized: None Comments:  antalgic  POSTURE: rounded shoulders, forward head, decreased lumbar lordosis, and flexed trunk    LUMBARAROM/PROM:   A/PROM A/PROM  Eval % available  Flexion 75  Extension 50  Right lateral flexion   Left lateral flexion   Right rotation   Left rotation    (Blank rows = not tested)  LOWER EXTREMITY ROM: appears within functional limitations    LOWER EXTREMITY MMT: within functional limitations, some swelling lat ankles    PALPATION:   General: upper chest breathing strategies  Pelvic Alignment: even  Abdominal: no tenderness                External Perineal Exam: tissues are pink, not dry, decreased clitoral hood mobility some                             Internal Pelvic Floor: some tenderness left bulbocavernosus, decreases movement of clitoral hood, able to contract, relax, had difficulty with bulging but able to do it with VC's  Patient confirms identification and approves PT to assess internal pelvic floor and treatment Yes  PELVIC MMT:   MMT eval  Vaginal 2/5, 4 reps, 20 sec  Internal Anal Sphincter   External Anal Sphincter   Puborectalis   Diastasis Recti no  (Blank rows = not tested)        TONE: low  PROLAPSE: Mild anterior and posterior vaginal laxity, some descent with cough    TODAY'S TREATMENT:                                                                                                                              DATE:  12/25/2023 Neuro reed- ball press with transverse abdominis breath                       Shoulder extension with theraband with transverse abdominis breath review   There act- second trial of transcutaneous tibial nerve stimulation for urinary urgency. Pt educated on electrode placement near medial malleoli bilateral feet and electrodes placed. Tens unit turned up until strong and comfortable sensation was felt in bilateral feet and sensation of toes curling and then turned down. Current ran for 20 mins. Pt was educated on  turning off unit, electrode cleaning and storing and protocol was given for self care at home. Pt to set up unit per protocol and reach out if help is needed with adjusting parameters to 200 HZ and 20 ns.   Review of HEP, progress  12/13/2023 Neuro reed- review of her HEP and exercises demonstrated There acts- internal muscle assessment and manual feedback for pelvic floor contraction and lengthening   12/07/2023    There acts- Trial of transcutaneous tibial nerve stimulation for urinary urgency. Pt educated on electrode placement near medial malleoli bilateral feet and electrodes placed. Tens unit turned up until strong and comfortable sensation was felt in bilateral feet and sensation  of toes curling and then turned down. Current ran for 20 mins. Pt was educated on turning off unit, electrode cleaning and storing and protocol was given for self care at home. Pt to set up unit per protocol and reach out if help is needed with adjusting parameters to 200 HZ and 20 ns.  Review of goals and progress     PATIENT EDUCATION/ there acts:  Education details: relevant anatomy, mouth gels, decrease water amount some, HEP, keep frontloading water, TTNS  Person educated: Patient Education method: Explanation, Demonstration, Tactile cues, Verbal cues, and Handouts Education comprehension: verbalized understanding, returned demonstration, verbal cues required, tactile cues required, and needs further education  HOME EXERCISE PROGRAM: [id number]  ASSESSMENT:  CLINICAL IMPRESSION: Pt reported that she did some bulging with her exercises and was developing a hemorrhoid, she is just trying to relax vs bulging. Feels like TTNS is working, did really well with it for a week.  Has been up since 4 a.m. today, has been struggling with sleep.  Reported exercises are going well, recent prescription of xanax . Is not sure it is working so well for her.  She is progressing towards goals, initial penetration still  helpful.  Tries to go to bed at 9 so she can get up at 4 or 4:30.  Progressing towards goals.      OBJECTIVE IMPAIRMENTS: Abnormal gait, decreased coordination, decreased endurance, decreased strength, and pain.   ACTIVITY LIMITATIONS: continence and toileting  PARTICIPATION LIMITATIONS: interpersonal relationship and community activity  PERSONAL FACTORS: low back pain are also affecting patient's functional outcome.   REHAB POTENTIAL: Good  CLINICAL DECISION MAKING: Evolving/moderate complexity  EVALUATION COMPLEXITY: Moderate   GOALS: Goals reviewed with patient? Yes  SHORT TERM GOALS: Target date: 11/27/2023    Pt will be independent with HEP.   Baseline: Goal status: met  2.  Pt will be independent with the knack, urge suppression technique, and double voiding in order to improve bladder habits and decrease urinary incontinence.   Baseline:  Goal status: INITIAL  3.  Pt will be able to functional actions such as walking to the bathroom at night without leakage  Baseline:  Goal status: INITIAL  4.  Pt will be independent with use of TTNS Baseline:  Goal status: met   LONG TERM GOALS: Target date: 01/30/2024  Pt will be independent with advanced HEP.   Baseline:  Goal status: INITIAL  2.  Pt will report 0/10 pain with vaginal penetration in order to improve intimate relationship with partner.    Baseline:  Goal status: progressing  3.  Pt will decrease frequency of nightly trips to the bathroom to 1 or less in order to get restful sleep.   Baseline:  Goal status: progressing  4.  Pt will soak 0 pads/ night to be able to get restful sleep to have more quality of life and less risk of falls  Baseline:  Goal status: progressing   PLAN:  PT FREQUENCY: 1-2x/week  PT DURATION: 12 weeks  PLANNED INTERVENTIONS: 97110-Therapeutic exercises, 97530- Therapeutic activity, 97112- Neuromuscular re-education, 97535- Self Care, [id number]- Manual therapy, [phone]-  Electrical stimulation (manual), Patient/Family education, Taping, Dry Needling, Joint mobilization, Joint manipulation, Spinal manipulation, Spinal mobilization, Scar mobilization, Cryotherapy, Moist heat, and Biofeedback  PLAN FOR NEXT SESSION:  education on bladder diary and irritants, urge suppression strategies   Maury Bamba, PT 12/25/2023, 9:27 AM

## 2024-01-01 ENCOUNTER — Encounter: Admitting: Physical Therapy

## 2024-01-08 ENCOUNTER — Encounter: Payer: Self-pay | Admitting: Physical Therapy

## 2024-01-08 ENCOUNTER — Ambulatory Visit: Admitting: Physical Therapy

## 2024-01-08 DIAGNOSIS — M6281 Muscle weakness (generalized): Secondary | ICD-10-CM | POA: Diagnosis not present

## 2024-01-08 DIAGNOSIS — M62838 Other muscle spasm: Secondary | ICD-10-CM

## 2024-01-08 DIAGNOSIS — R279 Unspecified lack of coordination: Secondary | ICD-10-CM

## 2024-01-08 NOTE — Therapy (Signed)
 OUTPATIENT PHYSICAL THERAPY FEMALE PELVIC TREATMENT   Patient Name: Kristin Tapia MRN: [id number] DOB:06/13/1946, 78 y.o., female Today's Date: 01/08/2024  END OF SESSION:  PT End of Session - 01/08/24 0936     Visit Number 5    Date for PT Re-Evaluation 01/30/24    Authorization Type [employer] Approved 10 visits-10/30/2023-01/30/2024-auth#[id number]    Authorization Time Period [employer] Approved 10 visits-10/30/2023-01/30/2024-auth#[id number]    Authorization - Visit Number 5    Progress Note Due on Visit 10    PT Start Time 0934    PT Stop Time 1013    PT Time Calculation (min) 39 min    Activity Tolerance Patient tolerated treatment well    Behavior During Therapy [facility] for tasks assessed/performed              Past Medical History:  Diagnosis Date   Abnormality of gait 03/11/2009   Allergy    immunotherapy/[facility] [place].   Anxiety    Arthritis    Asthma    childhood   Cataract    Cataract    [employer] 12/07/2019   Cavus deformity of foot, acquired 03/11/2009   Cystocele    Detached retina    GERD (gastroesophageal reflux disease)    Hip pain 01/17/2013   Metatarsalgia 03/11/2009   Osteoporosis    Postmenopausal atrophic vaginitis    Rectocele    Restless leg syndrome    L leg; s/p consultation by [facility]ier/Neurology.   Sciatica of left side 01/17/2010   Spinal stenosis    Squamous cell carcinoma of skin 01/25/2015   L shoulder   Thyroid disease    Past Surgical History:  Procedure Laterality Date   APPENDECTOMY     COLONOSCOPY  04/2009   Int & Ext Hemorrhoids, L sided diverticula.  [facility].  Repeat 5 years   ESOPHAGOGASTRODUODENOSCOPY  04/2009   tiny HH, minimal antritis, gastric polyps. [facility].   EYE SURGERY     MYOMECTOMY     [facility].   SPINE SURGERY N/A    [employer] 12/07/2019   TONSILLECTOMY     Patient Active Problem List   Diagnosis Date Noted   Chronic rhinitis 11/25/2023   Deviated nasal septum 11/25/2023   Hypertrophy of nasal turbinates  11/25/2023   Constipation 10/23/2023   Acute right ankle pain 09/19/2023   Urge urinary incontinence 08/28/2023   Dyspareunia, female 08/28/2023   Abnormal urinalysis 08/28/2023   Pelvic organ prolapse quantification stage 1 rectocele 08/28/2023   Plantar fasciitis of left foot 02/22/2022   Spinal stenosis at L4-L5 level 01/09/2020   Increased pressure in the eye, right 01/09/2020   S/P cervical spinal fusion 12/09/2019   Cervical radiculopathy 10/20/2019   Metatarsalgia of right foot 09/03/2018   Degenerative lumbar disc 11/02/2016   Psychophysiological insomnia 06/26/2016   Atrophic vaginitis 05/04/2016   Vaginal moniliasis 05/04/2016   Seasonal allergic rhinitis due to pollen 05/09/2015   Asthma, mild intermittent 05/09/2015   Tendinopathy of right rotator cuff 12/10/2014   Primary osteoarthritis involving multiple joints 05/23/2014   Osteopenia 05/23/2014   Right tennis elbow 03/05/2014   Pain in joint, ankle and foot 05/06/2013   Left hip pain 01/17/2013   Sciatica of left side 01/17/2010   Enthesopathy of ankle and tarsus 03/11/2009   Cavus deformity of foot 03/11/2009    PCP: Allen Lauraine CROME, PA-C  REFERRING PROVIDER:  Guadlupe Lianne DASEN, MD  REFERRING DIAG: N81.6 (ICD-10-CM) - Pelvic organ prolapse quantification stage 1 rectocele N94.10 (ICD-10-CM) - Dyspareunia, female N39.41 (ICD-10-CM) -  Urge urinary incontinence Nocturia   THERAPY DIAG:  Muscle weakness (generalized)  Other muscle spasm  Unspecified lack of coordination  Rationale for Evaluation and Treatment: Rehabilitation  ONSET DATE: 2015  SUBJECTIVE:                                                                                                                                                                                           SUBJECTIVE STATEMENT: Patient reports that she fell the other day, she was on Cymbalta . Does not remember it and woke up on the floor in a puddle of urine. Feels like she  was retaining her urine. She was nauseated and dizzy, Still dizzy today  She had one minor leak before she got on it and then 2 major leaks when she was on it.  Was nauseated and light headed while one Cymbalta  Has not been driving, feels fuzzy brained Husband had to drive her     Last session Pt reports that she used then TENS unit for a week and felt really good then.  Her PCP prescribed her Xanax  and when she would get up, have urge and leak. Feels like she is not getting much more sleep 7-8 hours Normally it is pain what wakes her up, she will wake up and walk around for up to 2 hours  She does not have sleep apnea, not using a c pap Feels like the tens unit is is helping urgency Filled out a bladder diary from years ago.  Reports that exercises are going well    Fluid intake: tea,water  PAIN:  Are you having pain? Yes varies by the day- uses estrogen cream 3 times/ week  PRECAUTIONS: None  RED FLAGS: None   WEIGHT BEARING RESTRICTIONS: No  FALLS:  Has patient fallen in last 6 months? No  OCCUPATION: retired  ACTIVITY LEVEL : active  PLOF: Independent  PATIENT GOALS: not to leak  PERTINENT HISTORY:  APPENDECTOMY      COLONOSCOPY  04/2009 Int & Ext Hemorrhoids, L sided diverticula.  [facility]od.  Repeat 5 years   ESOPHAGOGASTRODUODENOSCOPY  04/2009 tiny HH, minimal antritis, gastric polyps. [facility]od.   EYE SURGERY      MYOMECTOMY   [facility].   SPINE SURGERY N/A  [employer] 12/07/2019   TONSILLECTOMY       Sexual abuse: No  BOWEL MOVEMENT: no issues  URINATION: Pain with urination: No Fully empty bladder: Yes:   Stream: does not know, it depends Urgency: Yes at night Frequency: no Leakage: Urge to void and Walking to the bathroom Pads: Yes: a liner  INTERCOURSE:  Ability to have vaginal penetration Yes  Pain with intercourse: Initial Penetration and During Penetration DrynessYes - uses Good clean love lubricant Climax: yes with a vibrator Marinoff Scale:  1/3 Laxative:no  PREGNANCY: 2 Vaginal deliveries 2   PROLAPSE: Pressure and Bulge, splints for rectocele   OBJECTIVE:  Note: Objective measures were completed at Evaluation unless otherwise noted.  DIAGNOSTIC FINDINGS:  no  PFIQ-7: 14  COGNITION: Overall cognitive status: Within functional limits for tasks assessed     SENSATION: Light touch: Appears intact  LUMBAR SPECIAL TESTS:  Straight leg raise test: Negative    GAIT: Assistive device utilized: None Comments: antalgic  POSTURE: rounded shoulders, forward head, decreased lumbar lordosis, and flexed trunk    LUMBARAROM/PROM:   A/PROM A/PROM  Eval % available  Flexion 75  Extension 50  Right lateral flexion   Left lateral flexion   Right rotation   Left rotation    (Blank rows = not tested)  LOWER EXTREMITY ROM: appears within functional limitations    LOWER EXTREMITY MMT: within functional limitations, some swelling lat ankles    PALPATION:   General: upper chest breathing strategies  Pelvic Alignment: even  Abdominal: no tenderness                External Perineal Exam: tissues are pink, not dry, decreased clitoral hood mobility some                             Internal Pelvic Floor: some tenderness left bulbocavernosus, decreased movement of clitoral hood, able to contract, relax, had difficulty with bulging but able to do it with VC's  Patient confirms identification and approves PT to assess internal pelvic floor and treatment Yes  PELVIC MMT:   MMT eval  Vaginal 2/5, 4 reps, 20 sec  Internal Anal Sphincter   External Anal Sphincter   Puborectalis   Diastasis Recti no  (Blank rows = not tested)        TONE: low  PROLAPSE: Mild anterior and posterior vaginal laxity, some descent with cough    TODAY'S TREATMENT:                                                                                                                              DATE:  01/10/2024 Neuro reed-  diaphragmatic breathing 5 reps                      Ball press 20 reps                      Hip add with ball 10 reps                      Bilateral knee fallout with black loop less range                        There acts- review  of HEP, fall, TENS unit                      HR 60, O2 sats 99 12/25/2023 Neuro reed- ball press with transverse abdominis breath                       Shoulder extension with theraband with transverse abdominis breath review   There act- second trial of transcutaneous tibial nerve stimulation for urinary urgency. Pt educated on electrode placement near medial malleoli bilateral feet and electrodes placed. Tens unit turned up until strong and comfortable sensation was felt in bilateral feet and sensation of toes curling and then turned down. Current ran for 20 mins. Pt was educated on turning off unit, electrode cleaning and storing and protocol was given for self care at home. Pt to set up unit per protocol and reach out if help is needed with adjusting parameters to 200 HZ and 20 ns.   Review of HEP, progress  12/13/2023 Neuro reed- review of her HEP and exercises demonstrated There acts- internal muscle assessment and manual feedback for pelvic floor contraction and lengthening   12/07/2023    There acts- Trial of transcutaneous tibial nerve stimulation for urinary urgency. Pt educated on electrode placement near medial malleoli bilateral feet and electrodes placed. Tens unit turned up until strong and comfortable sensation was felt in bilateral feet and sensation of toes curling and then turned down. Current ran for 20 mins. Pt was educated on turning off unit, electrode cleaning and storing and protocol was given for self care at home. Pt to set up unit per protocol and reach out if help is needed with adjusting parameters to 200 HZ and 20 ns.  Review of goals and progress     PATIENT EDUCATION/ there acts:  Education details: relevant anatomy, mouth gels,  decrease water amount some, HEP, keep frontloading water, TTNS  Person educated: Patient Education method: Explanation, Demonstration, Tactile cues, Verbal cues, and Handouts Education comprehension: verbalized understanding, returned demonstration, verbal cues required, tactile cues required, and needs further education  HOME EXERCISE PROGRAM: [id number]  ASSESSMENT:  CLINICAL IMPRESSION: Patient was seen today for treatment of urinary urgency. Patient with increased leaking, dizziness and confusion due to being on Cymbalta . She had a fall on Sunday. She had some bruising on left hip, left arm and her nose. Patient did well with exercises and education today. We discussed progress, goals and recommended to keep with HEP and TENS unit as able. Treatment session focused on exercises to improve core and pelvic floor activation. Patient had minimal difficulty with her exercises, has good left hip PROM, significant bruising. Patient was  progressing well towards goals and will benefit from continued PT to address deficits, reduce leaking and improve quality of life.        OBJECTIVE IMPAIRMENTS: Abnormal gait, decreased coordination, decreased endurance, decreased strength, and pain.   ACTIVITY LIMITATIONS: continence and toileting  PARTICIPATION LIMITATIONS: interpersonal relationship and community activity  PERSONAL FACTORS: low back pain are also affecting patient's functional outcome.   REHAB POTENTIAL: Good  CLINICAL DECISION MAKING: Evolving/moderate complexity  EVALUATION COMPLEXITY: Moderate   GOALS: Goals reviewed with patient? Yes  SHORT TERM GOALS: Target date: 11/27/2023    Pt will be independent with HEP.   Baseline: Goal status: met  2.  Pt will be independent with the knack, urge suppression technique, and double voiding in order to improve bladder habits and decrease urinary incontinence.  Baseline:  Goal status: met  3.  Pt will be able to functional actions  such as walking to the bathroom at night without leakage  Baseline:  Goal status: progressing  4.  Pt will be independent with use of TTNS Baseline:  Goal status: met   LONG TERM GOALS: Target date: 01/30/2024  Pt will be independent with advanced HEP.   Baseline:  Goal status: INITIAL  2.  Pt will report 0/10 pain with vaginal penetration in order to improve intimate relationship with partner.    Baseline:  Goal status: progressing  3.  Pt will decrease frequency of nightly trips to the bathroom to 1 or less in order to get restful sleep.   Baseline:  Goal status: progressing  4.  Pt will soak 0 pads/ night to be able to get restful sleep to have more quality of life and less risk of falls  Baseline:  Goal status: progressing   PLAN:  PT FREQUENCY: 1-2x/week  PT DURATION: 12 weeks  PLANNED INTERVENTIONS: 97110-Therapeutic exercises, 97530- Therapeutic activity, 97112- Neuromuscular re-education, 97535- Self Care, [id number]- Manual therapy, [phone]- Electrical stimulation (manual), Patient/Family education, Taping, Dry Needling, Joint mobilization, Joint manipulation, Spinal manipulation, Spinal mobilization, Scar mobilization, Cryotherapy, Moist heat, and Biofeedback  PLAN FOR NEXT SESSION:  education on bladder diary and irritants, urge suppression strategies   Dailynn Nancarrow, PT 01/08/2024, 9:37 AM

## 2024-02-21 ENCOUNTER — Ambulatory Visit (INDEPENDENT_AMBULATORY_CARE_PROVIDER_SITE_OTHER): Admitting: Otolaryngology

## 2024-02-21 ENCOUNTER — Encounter (INDEPENDENT_AMBULATORY_CARE_PROVIDER_SITE_OTHER): Payer: Self-pay | Admitting: Otolaryngology

## 2024-02-21 VITALS — BP 118/72 | HR 74

## 2024-02-21 DIAGNOSIS — J343 Hypertrophy of nasal turbinates: Secondary | ICD-10-CM

## 2024-02-21 DIAGNOSIS — J342 Deviated nasal septum: Secondary | ICD-10-CM | POA: Diagnosis not present

## 2024-02-21 DIAGNOSIS — J310 Chronic rhinitis: Secondary | ICD-10-CM | POA: Diagnosis not present

## 2024-02-21 DIAGNOSIS — R0981 Nasal congestion: Secondary | ICD-10-CM | POA: Diagnosis not present

## 2024-02-21 NOTE — Progress Notes (Unsigned)
 Patient ID: Kristin Tapia, female   DOB: 1945/08/19, 77 y.o.   MRN: [id number]  Follow-up: Chronic nasal obstruction  HPI: The patient is a 78 year old female who returns today for follow-up evaluation.  The patient was previously seen for chronic nasal obstruction.  She was noted to have nasal drainage, nasal mucosal congestion, nasal septal deviation, and bilateral inferior turbinate hypertrophy.  She was treated with nasal saline irrigation, Atrovent, and Flonase nasal sprays.  Due to her glaucoma, the patient has been using her steroid nasal spray sparingly.  The patient returns today complaining of persistent nasal obstruction.  She is having significant difficulty breathing through her nose.  She recently had an accidental fall, due to an acute dizziness/lightheaded sensation.  The fall resulted in bruising of her nose.  She has a history of orthostatic hypotension.  She is scheduled to see a cardiologist.  Exam: General: Communicates without difficulty, well nourished, no acute distress. Head: Normocephalic, no evidence injury, no tenderness, facial buttresses intact without stepoff. Face/sinus: No tenderness to palpation and percussion. Facial movement is normal and symmetric. Eyes: PERRL, EOMI. No scleral icterus, conjunctivae clear. Neuro: CN II exam reveals vision grossly intact.  No nystagmus at any point of gaze. Ears: Auricles well formed without lesions.  Ear canals are intact without mass or lesion.  No erythema or edema is appreciated.  The TMs are intact without fluid. Nose: External evaluation reveals normal support and skin without lesions.  Dorsum is intact.  Anterior rhinoscopy reveals congested mucosa over anterior aspect of inferior turbinates and deviated septum.  No purulence noted. Oral:  Oral cavity and oropharynx are intact, symmetric, without erythema or edema.  Mucosa is moist without lesions. Neck: Full range of motion without pain.  There is no significant lymphadenopathy.   No masses palpable.  Thyroid bed within normal limits to palpation.  Parotid glands and submandibular glands equal bilaterally without mass.  Trachea is midline. Neuro:  CN 2-12 grossly intact.   Assessment: 1.  Chronic rhinitis with nasal mucosal congestion, nasal septal deviation, and bilateral inferior turbinate hypertrophy.  More than 95% of her nasal passageways are obstructed bilaterally. 2.  No acute infection is noted today.  Plan: 1.  The physical exam findings are reviewed with the patient. 2.  Continue with Flonase and Atrovent nasal sprays daily.  The patient is seeing her ophthalmologist regarding the continuing use of Flonase. 3.  In light of her persistent symptoms, she may benefit from surgical intervention with septoplasty and bilateral inferior turbinate reduction.  The risk, benefits, alternatives, and details of the procedures are extensively discussed.  Questions are invited and answered. 4.  The patient would like to proceed with the procedures.  We will schedule the procedures in accordance with the patient's schedule.

## 2024-02-21 NOTE — Progress Notes (Deleted)
 [facility] Return Visit  SUBJECTIVE  History of Present Illness: Kristin Tapia is a 78 y.o. female seen in follow-up for stage I pelvic organ prolapse, urgency urinary incontinence, vaginal atrophy, dyspareunia, and abnormal urinalysis. Plan at last visit was continue vaginal estrogen, continue bladder training and fluid management, referral to pelvic floor PT, and titration of miralax.   Pelvic floor PT***, using TENS unit with decreased urgency Fell after Cymbalta  with worsening leakage, dizziness, and confusion with sensation of incomplete emptying ***stopped fiber, ***started miralax Continues vaginal estrogen 1g 3x/wk with decreased vaginal pain. ***Denies vaginal pain today. Has not restarted Yuvafem .  Topical lidocaine  causes burning.  Reports increased urgency urinary incontinence at night around 3x/wk, reports 64oz of water intake/day Started Zyzal for allergies 2 days ago with increased thirst.  Voids every 2-3 hrs previously, now less frequency.  Reports increased constipation with Bristol I stool, increased psyllium fiber with bloating. Using 1/4 teaspoon.  Nuswab 08/28/23 negative. UA + heme, 0-5 RBC/hpf, culture negative  Past Medical History: Patient  has a past medical history of Abnormality of gait (03/11/2009), Allergy, Anxiety, Arthritis, Asthma, Cataract, Cataract, Cavus deformity of foot, acquired (03/11/2009), Cystocele, Detached retina, GERD (gastroesophageal reflux disease), Hip pain (01/17/2013), Metatarsalgia (03/11/2009), Osteoporosis, Postmenopausal atrophic vaginitis, Rectocele, Restless leg syndrome, Sciatica of left side (01/17/2010), Spinal stenosis, Squamous cell carcinoma of skin (01/25/2015), and Thyroid disease.   Past Surgical History: She  has a past surgical history that includes Appendectomy; Colonoscopy (04/2009); Esophagogastroduodenoscopy (04/2009); Tonsillectomy; Myomectomy; Eye surgery; and Spine surgery (N/A).   Medications: She has  a current medication list which includes the following prescription(s): acetaminophen, alprazolam , calcium-magnesium-vitamin d , cholecalciferol, cranberry, diclofenac  sodium, epinephrine, estradiol , famotidine , fexofenadine, fluticasone, horizant, ipratropium, latanoprost, levocetirizine, lidocaine , magnesium, vitamin k2, fish oil, OVER THE COUNTER MEDICATION, evenity , and timolol.   Allergies: Patient is allergic to sulfonamide derivatives.   Social History: Patient  reports that she has never smoked. She has never used smokeless tobacco. She reports that she does not drink alcohol and does not use drugs.     OBJECTIVE     Physical Exam: There were no vitals filed for this visit.  Gen: No apparent distress, A&O x 3.  Detailed Urogynecologic Evaluation:  Deferred. Prior exam showed:      No data to display             ASSESSMENT AND PLAN    Kristin Tapia is a 78 y.o. with:  No diagnosis found.   There are no diagnoses linked to this encounter.  Time spent: I spent *** minutes dedicated to the care of this patient on the date of this encounter to include pre-visit review of records, face-to-face time with the patient discussing urgency urinary incontinence, constipation, vaginal atrophy,  and post visit documentation and ordering referrals.    Kristin ONEIDA Gillis, MD

## 2024-02-22 ENCOUNTER — Ambulatory Visit: Admitting: Obstetrics

## 2024-03-20 NOTE — Progress Notes (Addendum)
 Cardiology Office Note Date:  03/31/2024  ID:  Kristin Tapia, Kristin Tapia [birth year], MRN [id number] PCP:  Allen Lauraine CROME, PA-C  Cardiologist: Joelle VEAR Ren Donley, MD  No chief complaint on file.  History of Present Illness: Kristin Tapia is a 78 y.o. female who presents for lightheadedness.   She has a history of orthostatic hypotension since she was in high school.  A few weeks ago she was started on Cymbalta  and following that medication states started having worsening lightheadedness.  He got so bad that she actually had an episode of syncope where she fainted and passed out.  She stopped the Cymbalta  after 4 days and has not been on it ever since.  She has continued to have intermittent episode of lightheadedness.  She states this is different than her usual because it lasted longer.  Seems to be worsened by moving quickly, especially her head.  She denies any worsening angina or shortness of breath.  She brought her blood pressure log and it does seem to like she has intermittent low blood pressure with standing with systolic in the 90s but she reports that this is her baseline.   ROS: Please see the history of present illness. All other systems are reviewed and negative.   Past Medical History:  Diagnosis Date   Abnormality of gait 03/11/2009   Allergy    immunotherapy/[facility].   Anxiety    Arthritis    Asthma    childhood   Cataract    Cataract    [employer] 12/07/2019   Cavus deformity of foot, acquired 03/11/2009   Cystocele    Detached retina    GERD (gastroesophageal reflux disease)    Hip pain 01/17/2013   Metatarsalgia 03/11/2009   Osteoporosis    Postmenopausal atrophic vaginitis    Rectocele    Restless leg syndrome    L leg; s/p consultation by Dohmeier/Neurology.   Sciatica of left side 01/17/2010   Spinal stenosis    Squamous cell carcinoma of skin 01/25/2015   L shoulder   Thyroid disease     Past Surgical History:  Procedure Laterality Date    APPENDECTOMY     COLONOSCOPY  04/2009   Int & Ext Hemorrhoids, L sided diverticula.  [facility].  Repeat 5 years   ESOPHAGOGASTRODUODENOSCOPY  04/2009   tiny HH, minimal antritis, gastric polyps. [facility].   EYE SURGERY     MYOMECTOMY     [facility].   SPINE SURGERY N/A    [employer] 12/07/2019   TONSILLECTOMY      Current Outpatient Medications  Medication Sig Dispense Refill   ACETAMINOPHEN PO Take by mouth daily.     ALPRAZolam  (XANAX ) 0.5 MG tablet Take 0.5 tablets (0.25 mg total) by mouth at bedtime as needed for anxiety. 30 tablet 1   Calcium-Magnesium-Vitamin D  (CALCIUM 1200+D3 PO) 1 tablet daily. Calcium 650mg  , Vit d mcg     Cholecalciferol (VITAMIN D3 PO) Take 25 mcg by mouth.     Cranberry (ELLURA) 200 MG CAPS      diclofenac  Sodium (VOLTAREN ) 1 % GEL Apply 2 g topically 4 (four) times daily. 4 times a day; 2 grams 2-4 times daily     EPINEPHrine 0.3 mg/0.3 mL IJ SOAJ injection      estradiol  (ESTRACE ) 0.1 MG/GM vaginal cream Use 1g nightly for 2 weeks, followed by 1g 3 times a week 42.5 g 3   famotidine  (PEPCID ) 20 MG tablet Take by mouth as needed.  fexofenadine (ALLEGRA) 180 MG tablet Take 180 mg by mouth every morning.     fluticasone (FLONASE) 50 MCG/ACT nasal spray 1 spray.     gabapentin  (NEURONTIN ) 100 MG capsule Take 100 mg by mouth daily.     gabapentin  (NEURONTIN ) 300 MG capsule Take 300 mg by mouth 2 (two) times daily.     ipratropium (ATROVENT) 0.03 % nasal spray Place 2 sprays into the nose 3 (three) times daily.     latanoprost (XALATAN) 0.005 % ophthalmic solution      levocetirizine (XYZAL ALLERGY 24HR) 5 MG tablet      lidocaine  (XYLOCAINE ) 5 % ointment Use 0.5g (peasize) up to 3 times a day or 15-82min prior to intercourse 35.44 g 0   MAGNESIUM GLYCINATE PLUS PO Take 400 mg by mouth.     Menaquinone-7 (VITAMIN K2) 100 MCG CAPS      Omega-3 Fatty Acids (FISH OIL) 1200 MG CAPS Take 1,200 mg by mouth daily. Patient takes 2 gel caps daily     OVER THE COUNTER  MEDICATION Xlelair     Romosozumab -aqqg (EVENITY ) 105 MG/1.17ML SOSY injection Inject 210 mg into the skin.     Romosozumab -aqqg (EVENITY ) 105 MG/1. SOSY injection Evenity      timolol (BETIMOL) 0.5 % ophthalmic solution 1 drop 2 (two) times daily. One drop both eyes every morning     No current facility-administered medications for this visit.    Allergies:   Sulfonamide derivatives   Social History:  Noncontributory  Family History:  Noncontributory  PHYSICAL EXAM: VS:  BP 126/79 (BP Location: Right Arm, Patient Position: Sitting)   Pulse 70   Ht 5' 3.5 (1.613 m)   Wt 131 lb 8 oz (59.6 kg)   SpO2 97%   BMI 22.93 kg/m  , BMI Body mass index is 22.93 kg/m. GEN: Well nourished, well developed, in no acute distress HEENT: normal Neck: no JVD, carotid bruits, or masses Cardiac: RRR; no murmurs, rubs, or gallops,no edema  Respiratory:  CTAB bilaterally, normal work of breathing GI: soft, nontender, nondistended, + BS Extremities: No LE edema Skin: warm and dry, no rash Neuro:  Strength and sensation are intact  EKG: NSR  Recent Labs: Reviewed  Studies: Reviewed  ASSESSMENT AND PLAN: Kristin Tapia is a 78 y.o. female who presents for lightheadedness.  - Hx of orthostatic hypotension at baseline that has been having worsening lightheadedness that is worsened with head turning. OH vitals negative for orthostasis in clinic. Her symptoms seem to be more consistent with BPPV and thus, I referred to neurology. I will order TTE for further evaluation. - I discussed statin initiation but patient is reluctant given the risk of myalgias.  I suggested that she talk to her PCP given her ASCVD risk in the 30% range.  - Regarding her nasal septum surgery, no additional evaluation needed given patient walking ~10k steps per day without any symptoms. Okay to proceed from cardiac standpoint.    Signed, Joelle VEAR Ren Donley, MD  03/31/2024 9:22 AM    [facility]

## 2024-03-31 ENCOUNTER — Ambulatory Visit

## 2024-03-31 VITALS — BP 126/79 | HR 70 | Ht 63.5 in | Wt 131.5 lb

## 2024-03-31 DIAGNOSIS — R55 Syncope and collapse: Secondary | ICD-10-CM | POA: Diagnosis not present

## 2024-03-31 DIAGNOSIS — R42 Dizziness and giddiness: Secondary | ICD-10-CM

## 2024-03-31 NOTE — Patient Instructions (Signed)
 Medication Instructions:  NO CHANGES   Lab Work: NONE TO BE DONE TODAY.   Testing/Procedures: Your physician has requested that you have an echocardiogram. Echocardiography is a painless test that uses sound waves to create images of your heart. It provides your doctor with information about the size and shape of your heart and how well your heart's chambers and valves are working. This procedure takes approximately one hour. There are no restrictions for this procedure. Please do NOT wear cologne, perfume, aftershave, or lotions (deodorant is allowed). Please arrive 15 minutes prior to your appointment time.  Please note: We ask at that you not bring children with you during ultrasound (echo/ vascular) testing. Due to room size and safety concerns, children are not allowed in the ultrasound rooms during exams. Our front office staff cannot provide observation of children in our lobby area while testing is being conducted. An adult accompanying a patient to their appointment will only be allowed in the ultrasound room at the discretion of the ultrasound technician under special circumstances. We apologize for any inconvenience.   Follow-Up: At [facility], you and your health needs are our priority.  As part of our continuing mission to provide you with exceptional heart care, our providers are all part of one team.  This team includes your primary Cardiologist (physician) and Advanced Practice Providers or APPs (Physician Assistants and Nurse Practitioners) who all work together to provide you with the care you need, when you need it.  Your next appointment:   AS NEEDED  Provider:   REN, MD   Other Instructions: REFERRAL TO NEUROLOGY HAS BEEN PUT IN AND SOMEONE FROM THE OFFICE WILL CONTACT YOU TO SCHEDULE YOU AN APPOINTMENT.

## 2024-04-02 ENCOUNTER — Ambulatory Visit (INDEPENDENT_AMBULATORY_CARE_PROVIDER_SITE_OTHER): Admitting: Otolaryngology

## 2024-04-02 ENCOUNTER — Encounter (INDEPENDENT_AMBULATORY_CARE_PROVIDER_SITE_OTHER): Payer: Self-pay | Admitting: Otolaryngology

## 2024-04-02 VITALS — BP 124/78 | HR 68 | Temp 97.9°F | Ht 63.5 in | Wt 126.0 lb

## 2024-04-02 DIAGNOSIS — J3489 Other specified disorders of nose and nasal sinuses: Secondary | ICD-10-CM

## 2024-04-02 DIAGNOSIS — J342 Deviated nasal septum: Secondary | ICD-10-CM

## 2024-04-02 DIAGNOSIS — J343 Hypertrophy of nasal turbinates: Secondary | ICD-10-CM | POA: Diagnosis not present

## 2024-04-02 DIAGNOSIS — J310 Chronic rhinitis: Secondary | ICD-10-CM

## 2024-04-02 DIAGNOSIS — R42 Dizziness and giddiness: Secondary | ICD-10-CM | POA: Insufficient documentation

## 2024-04-02 NOTE — Progress Notes (Signed)
 Patient ID: Kristin Tapia, female   DOB: January 21, 1946, 78 y.o.   MRN: [id number]  Follow-up: Chronic nasal obstruction  Discussed the use of AI scribe software for clinical note transcription with the patient, who gave verbal consent to proceed.  History of Present Illness Kristin Tapia is a 78 year old female who presents with persistent nasal obstruction.  She experiences ongoing difficulty breathing through her nose. Her nasal congestion is persistent, with a sensation of obstruction that is never fully relieved. She uses Flonase one spray a day due to her glaucoma, but acknowledges this is below the therapeutic dose.  She has a history of slow healing from previous procedures, including a recent tooth extraction and cervical surgery, which took longer than expected to recover. She also reports slow recovery from general anesthesia, often requiring overnight stays for what should have been day surgeries, and experiencing nausea post-operatively.  She experiences lightheadedness and has been evaluated by a cardiologist. An EKG showed her heart is in good condition, and she has been cleared for surgery. The cardiologist recommended an echocardiogram and a referral to a neurologist for further evaluation of her lightheadedness.  She has a history of arthritis, including in her feet and ankles, and uses Voltaren  gel for pain management. She cannot take NSAIDs due to previous advice to discontinue them. She also has a history of osteoporosis treatment, though not with Fosamax , and expresses concern about her slow healing in relation to bone health.  She engages in regular physical activities such as water aerobics, yoga, and brisk walking, which she plans to continue post-surgery.  Exam: General: Communicates without difficulty, well nourished, no acute distress. Head: Normocephalic, no evidence injury, no tenderness, facial buttresses intact without stepoff. Face/sinus: No tenderness to  palpation and percussion. Facial movement is normal and symmetric. Eyes: PERRL, EOMI. No scleral icterus, conjunctivae clear. Neuro: CN II exam reveals vision grossly intact.  No nystagmus at any point of gaze. Ears: Auricles well formed without lesions.  Ear canals are intact without mass or lesion.  No erythema or edema is appreciated.  The TMs are intact without fluid. Nose: External evaluation reveals normal support and skin without lesions.  Dorsum is intact.  Anterior rhinoscopy reveals congested mucosa over anterior aspect of inferior turbinates and deviated septum.  No purulence noted. Oral:  Oral cavity and oropharynx are intact, symmetric, without erythema or edema.  Mucosa is moist without lesions. Neck: Full range of motion without pain.  There is no significant lymphadenopathy.  No masses palpable.  Thyroid bed within normal limits to palpation.  Parotid glands and submandibular glands equal bilaterally without mass.  Trachea is midline. Neuro:  CN 2-12 grossly intact.   Assessment and Plan Assessment & Plan Chronic nasal obstruction due to nasal septal deviation and bilateral inferior turbinate hypertrophy  Concerns about slow healing and anesthesia reactions can be managed preoperatively. - Continue Flonase nasal spray. - Consider surgical intervention if nasal obstruction significantly affects quality of life. -The risk, benefits, and details of the procedure are extensively discussed. - Discussed anesthesia concerns. - Avoid strenuous activities for 3-4 days post-surgery if surgery is pursued.  Orthostatic hypotension Lightheadedness persists. Cardiologist cleared her for surgery. EKG performed, echocardiogram planned. Neurology referral recommended for further evaluation. - Proceed with echocardiogram as planned by cardiologist. - Refer to neurology for evaluation of lightheadedness.

## 2024-04-14 ENCOUNTER — Telehealth (HOSPITAL_COMMUNITY): Payer: Self-pay

## 2024-04-14 NOTE — Telephone Encounter (Signed)
 Patient cancelled scheduled ECHO BUBBLE for reason below:  04/11/2024 9:42 AM Ab:YJFFZM, ANGELINE S  Cancel Rsn: Patient (per patient, her pcp would like her to get a different test and would like to hold off this test)   Order will be removed from the echo WQ. Thank you.

## 2024-04-18 ENCOUNTER — Encounter: Payer: Self-pay | Admitting: Neurology

## 2024-05-06 ENCOUNTER — Ambulatory Visit: Admitting: Obstetrics

## 2024-05-09 ENCOUNTER — Other Ambulatory Visit (HOSPITAL_COMMUNITY)

## 2024-05-26 ENCOUNTER — Ambulatory Visit (INDEPENDENT_AMBULATORY_CARE_PROVIDER_SITE_OTHER): Admitting: Otolaryngology

## 2024-05-28 ENCOUNTER — Ambulatory Visit: Admitting: Cardiology

## 2024-07-07 ENCOUNTER — Encounter: Payer: Self-pay | Admitting: *Deleted

## 2024-07-14 ENCOUNTER — Ambulatory Visit: Admitting: Obstetrics

## 2024-07-14 ENCOUNTER — Other Ambulatory Visit (HOSPITAL_COMMUNITY)
Admission: RE | Admit: 2024-07-14 | Discharge: 2024-07-14 | Disposition: A | Source: Ambulatory Visit | Attending: Obstetrics | Admitting: Obstetrics

## 2024-07-14 ENCOUNTER — Other Ambulatory Visit (HOSPITAL_COMMUNITY)
Admission: RE | Admit: 2024-07-14 | Discharge: 2024-07-14 | Disposition: A | Discharge status: Home / Self Care | Source: Ambulatory Visit | Attending: Obstetrics | Admitting: Obstetrics

## 2024-07-14 ENCOUNTER — Encounter: Payer: Self-pay | Admitting: Obstetrics

## 2024-07-14 VITALS — BP 118/75 | HR 70

## 2024-07-14 DIAGNOSIS — N898 Other specified noninflammatory disorders of vagina: Secondary | ICD-10-CM | POA: Diagnosis present

## 2024-07-14 DIAGNOSIS — R8290 Unspecified abnormal findings in urine: Secondary | ICD-10-CM

## 2024-07-14 DIAGNOSIS — N952 Postmenopausal atrophic vaginitis: Secondary | ICD-10-CM | POA: Diagnosis not present

## 2024-07-14 DIAGNOSIS — N3941 Urge incontinence: Secondary | ICD-10-CM

## 2024-07-14 LAB — POCT URINALYSIS DIP (CLINITEK)
Bilirubin, UA: NEGATIVE
Bilirubin, UA: NEGATIVE
Glucose, UA: NEGATIVE mg/dL
Glucose, UA: NEGATIVE mg/dL
Ketones, POC UA: NEGATIVE mg/dL
Ketones, POC UA: NEGATIVE mg/dL
Leukocytes, UA: NEGATIVE
Nitrite, UA: NEGATIVE
Nitrite, UA: NEGATIVE
POC PROTEIN,UA: NEGATIVE
POC PROTEIN,UA: NEGATIVE
Spec Grav, UA: 1.015
Spec Grav, UA: 1.015
Urobilinogen, UA: 0.2 U/dL
Urobilinogen, UA: 0.2 U/dL
pH, UA: 7
pH, UA: 7.5

## 2024-07-14 LAB — URINALYSIS, COMPLETE (UACMP) WITH MICROSCOPIC
Bilirubin Urine: NEGATIVE
Glucose, UA: NEGATIVE mg/dL
Hgb urine dipstick: NEGATIVE
Ketones, ur: NEGATIVE mg/dL
Leukocytes,Ua: NEGATIVE
Nitrite: NEGATIVE
Protein, ur: NEGATIVE mg/dL
Specific Gravity, Urine: 1.011 (ref 1.005–1.030)
pH: 7 (ref 5.0–8.0)

## 2024-07-14 MED ORDER — ESTRADIOL 10 MCG VA TABS: 1.0000 | ORAL_TABLET | VAGINAL | 3 refills | Status: AC

## 2024-07-14 NOTE — Patient Instructions (Addendum)
 Continue TENs unit use regularly for urinary symptoms.   Please return if you experience any change in urinary or vaginal symptoms.   Continue vaginal estrogen with Yuvafem  2x/week.  For management of microscopic hematuria, we discussed the importance of work-up including assessing the upper and lower genitourinary tract with CT urogram and cystoscopy.  We will contact you with your urine testing results.

## 2024-07-14 NOTE — Assessment & Plan Note (Signed)
-   intermittent odor - pending Nuswab to r/o infectious etiology

## 2024-07-14 NOTE — Assessment & Plan Note (Addendum)
-   08/28/23 POCT UA + heme, catheterized urine + heme, UA microscopy 0-5 RBC/hpf and negative culture. Negative UA 10/12/23.  - repeat catheterized UA leuk/heme today, pending UA microscopy and culture. - For management of microscopic hematuria, we discussed the importance of work-up including assessing the upper and lower GU tract with CT urogram and cystoscopy. Cr 0.85 in 07/16/23. She will pursue this work-up and follow-up afterward to discuss the results and decide on a treatment plan based on the findings.  - denies tobacco use, gross hematuria, pelvic radiation

## 2024-07-14 NOTE — Assessment & Plan Note (Signed)
-   08/28/23 POCT UA + leuk/heme, UA microscopy 0-5 RBC/hpf and culture negative - attributed to RLS and back pain which requires movement at night, denies association with urinary urgency - improved since pelvic floor PT, desires to continue TENs unit and encouraged to increase frequency of use - continue vaginal estrogen for atrophy, prefers Yuvafem  with Rx refilled - We discussed the symptoms of overactive bladder (OAB), which include urinary urgency, urinary frequency, nocturia, with or without urge incontinence.  While we do not know the exact etiology of OAB, several treatment options exist. We discussed management including behavioral therapy (decreasing bladder irritants, urge suppression strategies, timed voids, bladder retraining), physical therapy, medication; for refractory cases posterior tibial nerve stimulation, sacral neuromodulation, and intravesical botulinum toxin injection.  For anticholinergic medications, we discussed the potential side effects of anticholinergics including dry eyes, dry mouth, constipation, cognitive impairment and urinary retention. For Beta-3 agonist medication, we discussed the potential side effect of elevated blood pressure which is more likely to occur in individuals with uncontrolled hypertension. - continue bladder training and reviewed fluid management - For refractory OAB we reviewed the procedure for intravesical Botox injection with cystoscopy in the office and reviewed the risks, benefits and alternatives of treatment including but not limited to infection, need for self-catheterization and need for repeat therapy.   - We discussed that there is a 5-15% chance of needing to catheterize with Botox and that this usually resolves in a few months; however can persist for longer periods of time.   - Typically Botox injections would need to be repeated every 3-12 months since this is not a permanent therapy. She will return for the procedure.  - declines  medications or 3rd line therapy at this time. Reviewed botox injections per pt request

## 2024-07-14 NOTE — Progress Notes (Signed)
 [facility] Return Visit  SUBJECTIVE  History of Present Illness: Kristin Tapia is a 79 y.o. female seen in follow-up for stage I pelvic organ prolapse, urgency urinary incontinence, vaginal atrophy, dyspareunia, and abnormal urinalysis. Plan at last visit was pelvic floor PT, lubrication use, continue vaginal estrogen, continue bladder training and fluid management, and titration of miralax.   Continues to have UUI 2-3x/week with small volume leakage, declines medications or interventions due to too many medications.  Day time voids 6.  Nocturia: 2-3  Underwent pelvic floor PT, last in 12/2023 Using TENS unit 3-4x/week with decreased urgency, notices more symptoms with reduced TENS unit use. Fell after Cymbalta  use for 4 days with worsening leakage, dizziness, and confusion with sensation of incomplete emptying. Reports lightheaded and inability to drive after Cymbalta . Underwent PT for benign positional vertigo. Pending appointment with neurology 07/30/24.  Stopped miralax due to dietary fiber and reports sticky BM daily. Stopped fiber supplementation due to bloating.  Reports intermittent vaginal odor, denies bleeding or discharge.  Stopped vaginal estrogen 1g 3x/week with prior reduction of vaginal pain. Denies vaginal pain today. Restarted Yuvafem  2x/week with decreased messiness. Topical lidocaine  causes burning.  Reports increased urgency urinary incontinence at night around 3x/week, reports 64oz of water intake/day Uses Zyzal for allergies 1x/month prior to massages   Nuswab 08/28/23 negative. UA + heme, 0-5 RBC/hpf, culture negative  Past Medical History: Patient  has a past medical history of Abnormality of gait (03/11/2009), Allergy, Anxiety, Arthritis, Asthma, Cataract, Cataract, Cavus deformity of foot, acquired (03/11/2009), Cystocele, Detached retina, GERD (gastroesophageal reflux disease), Hip pain (01/17/2013), Metatarsalgia (03/11/2009), Osteoporosis, Postmenopausal  atrophic vaginitis, Rectocele, Restless leg syndrome, Sciatica of left side (01/17/2010), Spinal stenosis, Squamous cell carcinoma of skin (01/25/2015), and Thyroid disease.   Past Surgical History: She  has a past surgical history that includes Appendectomy; Colonoscopy (04/2009); Esophagogastroduodenoscopy (04/2009); Tonsillectomy; Myomectomy; Eye surgery; and Spine surgery (N/A).   Medications: She has a current medication list which includes the following prescription(s): acetaminophen, alprazolam , calcium-magnesium-vitamin d , cholecalciferol, cranberry, diclofenac  sodium, epinephrine, estradiol , famotidine , fexofenadine, fluticasone, gabapentin , gabapentin , ipratropium, latanoprost, levocetirizine, lidocaine , magnesium, vitamin k2, fish oil, OVER THE COUNTER MEDICATION, evenity , evenity , and timolol.   Allergies: Patient is allergic to sulfonamide derivatives.   Social History: Patient  reports that she has never smoked. She has never used smokeless tobacco. She reports that she does not drink alcohol and does not use drugs.     OBJECTIVE     Physical Exam: Vitals:   07/14/24 0944  BP: 118/75  Pulse: 70   Gen: No apparent distress, A&O x 3.  Detailed Urogynecologic Evaluation:  Deferred. Prior exam showed:  Straight Catheterization Procedure for PVR: After verbal consent was obtained from the patient for catheterization to assess bladder emptying and residual volume the urethra and surrounding tissues were prepped with betadine and an in and out catheterization was performed.  PVR was .  Urine appeared clear yellow. The patient tolerated the procedure well.   Lab Results  Component Value Date   COLORU yellow 07/14/2024   CLARITYU clear 07/14/2024   GLUCOSEUR negative 07/14/2024   BILIRUBINUR negative 07/14/2024   KETONESU Negative 08/28/2023   SPECGRAV 1.015 07/14/2024   RBCUR small (A) 07/14/2024   PHUR 7.5 07/14/2024   PROTEINUR NEGATIVE 08/28/2023   UROBILINOGEN  0.2 07/14/2024   LEUKOCYTESUR Trace (A) 07/14/2024        ASSESSMENT AND PLAN    Ms. Kristin Tapia is a 79 y.o. with:  1. Urge urinary  incontinence   2. Abnormal urinalysis   3. Atrophic vaginitis   4. Vaginal odor      Urge urinary incontinence Assessment & Plan: - 08/28/23 POCT UA + leuk/heme, UA microscopy 0-5 RBC/hpf and culture negative - attributed to RLS and back pain which requires movement at night, denies association with urinary urgency - improved since pelvic floor PT, desires to continue TENs unit and encouraged to increase frequency of use - continue vaginal estrogen for atrophy, prefers Yuvafem  with Rx refilled - We discussed the symptoms of overactive bladder (OAB), which include urinary urgency, urinary frequency, nocturia, with or without urge incontinence.  While we do not know the exact etiology of OAB, several treatment options exist. We discussed management including behavioral therapy (decreasing bladder irritants, urge suppression strategies, timed voids, bladder retraining), physical therapy, medication; for refractory cases posterior tibial nerve stimulation, sacral neuromodulation, and intravesical botulinum toxin injection.  For anticholinergic medications, we discussed the potential side effects of anticholinergics including dry eyes, dry mouth, constipation, cognitive impairment and urinary retention. For Beta-3 agonist medication, we discussed the potential side effect of elevated blood pressure which is more likely to occur in individuals with uncontrolled hypertension. - continue bladder training and reviewed fluid management - For refractory OAB we reviewed the procedure for intravesical Botox injection with cystoscopy in the office and reviewed the risks, benefits and alternatives of treatment including but not limited to infection, need for self-catheterization and need for repeat therapy.   - We discussed that there is a 5-15% chance of needing to catheterize  with Botox and that this usually resolves in a few months; however can persist for longer periods of time.   - Typically Botox injections would need to be repeated every 3-12 months since this is not a permanent therapy. She will return for the procedure.  - declines medications or 3rd line therapy at this time. Reviewed botox injections per pt request  Orders: -     POCT URINALYSIS DIP (CLINITEK) -     POCT URINALYSIS DIP (CLINITEK)  Abnormal urinalysis Assessment & Plan: - 08/28/23 POCT UA + heme, catheterized urine + heme, UA microscopy 0-5 RBC/hpf and negative culture. Negative UA 10/12/23.  - repeat catheterized UA leuk/heme today, pending UA microscopy and culture. - For management of microscopic hematuria, we discussed the importance of work-up including assessing the upper and lower GU tract with CT urogram and cystoscopy. Cr 0.85 in 07/16/23. She will pursue this work-up and follow-up afterward to discuss the results and decide on a treatment plan based on the findings.  - denies tobacco use, gross hematuria, pelvic radiation  Orders: -     POCT URINALYSIS DIP (CLINITEK) -     Urine Culture; Future -     Urinalysis, Complete w Microscopic; Future  Atrophic vaginitis -     Estradiol ; Place 1 tablet (10 mcg total) vaginally 2 (two) times a week.  Dispense: 24 tablet; Refill: 3  Vaginal odor Assessment & Plan: - intermittent odor - pending Nuswab to r/o infectious etiology  Orders: -     Cervicovaginal ancillary only   Time spent: I spent 30 minutes dedicated to the care of this patient on the date of this encounter to include pre-visit review of records, face-to-face time with the patient discussing urgency urinary incontinence, constipation, vaginal atrophy,  and post visit documentation and ordering referrals.    Lianne ONEIDA Gillis, MD

## 2024-07-15 ENCOUNTER — Ambulatory Visit: Payer: Self-pay | Admitting: Obstetrics

## 2024-07-15 LAB — URINE CULTURE: Culture: NO GROWTH

## 2024-07-15 LAB — CERVICOVAGINAL ANCILLARY ONLY
Bacterial Vaginitis (gardnerella): NEGATIVE
Candida Glabrata: NEGATIVE
Candida Vaginitis: NEGATIVE
Comment: NEGATIVE
Comment: NEGATIVE
Comment: NEGATIVE

## 2024-07-18 NOTE — Progress Notes (Signed)
 "  Initial neurology clinic note  Kristin Tapia MRN: [id number] DOB: [birth year]  Referring provider: Ren Ny, Joelle DEL, MD  Primary care provider: Allen Lauraine CROME, PA-C  Reason for consult:  Lightheadedness  Subjective:  This is Kristin Tapia, a 79 y.o. right-handed female with a medical history of cervical spine disease s/p surgery (~79 years of age), lumbar radiculopathy, OA, asthma, anxiety, detached retina, GERD, RLS, and hypothyroidism who presents to neurology clinic with lightheadedness. The patient is alone today.  Patient has a long history of orthostatic intolerance. She mentions being young and standing at choir and getting lightheaded. She was able to manage though. Patient's symptoms started in 12/2023 with nasal congestion. She was referred to ENT. While waiting to be seen by ENT, PCP started patient on Cymbalta  for pain (lumbar spinal pain and arthritis) and insomnia. She had bad side effects though with lightheadedness and nausea. She was not eating well at that time either. On day 4 after started Cymbalta , she had an episode of syncope. She did not take Cymbalta  any further. She was very out of it after the fall. She did not seek medical attention for this despite being very bruised up.For about 10 days her lightheadedness was very disabling not allowing her to drive or walk as usual.  She has a good sense of smell. She denies any freezing. She denies any falls since fainting in 12/2023.  She was seen by someone with her PCP office and found to have high blood pressure. In the meantime, she saw ENT, who recommended getting nasal septal surgery. BPPV testing was negative. Patient was referred to cardiology to be cleared for surgery. She was checking her BP at home in the meantime and was very normal. Cardiology cleared patient for surgery. Orthostatic vitals negative in cardiology clinic. They recommended echo but patient did not pursue this.  ENT felt patient should  see neurology for this lightheadedness.  PCP sent patient to PT. PT helped. She thinks she has improved more than 75% better since 12/2023. This is currently getting better and tolerable. She knows that certain things like breathing in yoga makes it worse, so she avoids this. She has improved in this regard so she almost cancelled the appointment, but her pain worsened.  Her pain is mostly in her low back and legs, left leg worse than right. She has radiating pain down the leg. She has pain in both hips and right knee. This has not allowed her to walk as she normally would. She is also having wrist and neck pain. She think the neck pain is due to her not being able to get comfortable at night. The pain keeps her from sleeping at night. Her sleep app shows that she is getting very little deep sleep. She had a sleep study in 2020. Only pain and getting up for urination were noted.  She has discussed back surgery in the past, but was told surgery would not help with arthritis, so she never pursued this. She has seen pain management in the past (neurosurgeon?) and gotten injections in the past. The most this has ever helped was 10 days. She has not seen anyone in pain for about 6 months (sees Dr. Waylon at [facility]).   Currently she takes gabapentin  400 mg at bedtime that helps some. She has not noticed side effects other than tremors since starting it many years ago and dry mouth. Her tremor is worse with action. She will also take tylenol 1000  mg three times a day lately. She also uses Voltaren  gel.  She is worried about an inflammatory condition that is causing her symptoms.  Never smoker. EtOH use: none Restrictive diet: very little red meat but otherwise normal Family history of neurologic disease: no  MEDICATIONS:  Outpatient Encounter Medications as of 07/30/2024  Medication Sig Note   ACETAMINOPHEN PO Take by mouth daily.    ALPRAZolam  (XANAX ) 0.5 MG tablet Take 0.5 tablets (0.25 mg total) by  mouth at bedtime as needed for anxiety.    Calcium-Magnesium-Vitamin D  (CALCIUM 1200+D3 PO) 1 tablet daily. Calcium 650mg  , Vit d mcg    Cholecalciferol (VITAMIN D3 PO) Take 25 mcg by mouth.    Cranberry (ELLURA) 200 MG CAPS     diclofenac  Sodium (VOLTAREN ) 1 % GEL Apply 2 g topically 4 (four) times daily. 4 times a day; 2 grams 2-4 times daily    EPINEPHrine 0.3 mg/0.3 mL IJ SOAJ injection     Estradiol  (YUVAFEM ) 10 MCG TABS vaginal tablet Place 1 tablet (10 mcg total) vaginally 2 (two) times a week.    famotidine  (PEPCID ) 20 MG tablet Take by mouth as needed.    fexofenadine (ALLEGRA) 180 MG tablet Take 180 mg by mouth every morning.    fluticasone (FLONASE) 50 MCG/ACT nasal spray 1 spray.    gabapentin  (NEURONTIN ) 100 MG capsule Take 100 mg by mouth daily.    gabapentin  (NEURONTIN ) 300 MG capsule Take 300 mg by mouth 2 (two) times daily.    ipratropium (ATROVENT) 0.03 % nasal spray Place 2 sprays into the nose 3 (three) times daily.    latanoprost (XALATAN) 0.005 % ophthalmic solution     levocetirizine (XYZAL ALLERGY 24HR) 5 MG tablet     lidocaine  (XYLOCAINE ) 5 % ointment Use 0.5g (peasize) up to 3 times a day or 15-7min prior to intercourse    MAGNESIUM GLYCINATE PLUS PO Take 400 mg by mouth. 04/03/2018: Take 800 daily   Menaquinone-7 (VITAMIN K2) 100 MCG CAPS     Omega-3 Fatty Acids (FISH OIL) 1200 MG CAPS Take 1,200 mg by mouth daily. Patient takes 2 gel caps daily 04/03/2018: Take 1000 mg bid   OVER THE COUNTER MEDICATION Xlelair    Romosozumab -aqqg (EVENITY ) 105 MG/1.17ML SOSY injection Inject 210 mg into the skin.    Romosozumab -aqqg (EVENITY ) 105 MG/1. SOSY injection Evenity     timolol (BETIMOL) 0.5 % ophthalmic solution 1 drop 2 (two) times daily. One drop both eyes every morning    No facility-administered encounter medications on file as of 07/30/2024.    PAST MEDICAL HISTORY: Past Medical History:  Diagnosis Date   Abnormality of gait 03/11/2009   Allergy     immunotherapy/[facility].   Anxiety    Arthritis    Asthma    childhood   Cataract    Cataract    [employer] 12/07/2019   Cavus deformity of foot, acquired 03/11/2009   Cystocele    Detached retina    GERD (gastroesophageal reflux disease)    Hip pain 01/17/2013   Metatarsalgia 03/11/2009   Osteoporosis    Postmenopausal atrophic vaginitis    Rectocele    Restless leg syndrome    L leg; s/p consultation by [facility]/Neurology.   Sciatica of left side 01/17/2010   Spinal stenosis    Squamous cell carcinoma of skin 01/25/2015   L shoulder   Thyroid disease     PAST SURGICAL HISTORY: Past Surgical History:  Procedure Laterality Date   APPENDECTOMY  COLONOSCOPY  04/2009   Int & Ext Hemorrhoids, L sided diverticula.  [facility].  Repeat 5 years   ESOPHAGOGASTRODUODENOSCOPY  04/2009   tiny HH, minimal antritis, gastric polyps. [facility].   EYE SURGERY     MYOMECTOMY     [facility].   SPINE SURGERY N/A    [employer] 12/07/2019   TONSILLECTOMY      ALLERGIES: Allergies[1]  FAMILY HISTORY: Family History  Problem Relation Age of Onset   Arthritis Mother        mild   Hypertension Mother    Dementia Mother    Cancer Mother 61       breast cancer   Hyperlipidemia Mother    Heart disease Mother    COPD Father    Stroke Father    Heart disease Father    Seizures Brother    Thyroid cancer Brother    Thyroid disease Brother    Osteoporosis Neg Hx    Bladder Cancer Neg Hx    Uterine cancer Neg Hx    Renal cancer Neg Hx     SOCIAL HISTORY: Social History[2] Social History   Social History Narrative   Marital status:  Married x 48 years; happily married      Children:  2 children; 1 grandchild Carletta)      Lives: with husband.      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      Tobacco: never      Alcohol: none      Drugs: none      Exercise: daily.  3-4 miles walking daily; water aerobics three days per  week warm water.  1-5-2.0 hours per day. Walks atleast 10,000 steps per day; warm water exercises three days per week.      Advanced Directives:  +Living Will; DNR       ADLs: independent with all ADLs; no assistant devices for ambulation.   Caffeine 1 cup of tea    Facility on second floor has elevator    Objective:  Vital Signs:  BP (!) 148/83   Pulse 70   Ht 5' 3.5 (1.613 m)   Wt 132 lb (59.9 kg)   SpO2 98%   BMI 23.02 kg/m   General: General appearance: Awake and alert. No distress. Cooperative with exam.  Skin: No obvious rash or jaundice. HEENT: Atraumatic. Anicteric. Lungs: Non-labored breathing on room air  Heart: Regular Abdomen: Soft, non tender. Extremities: No edema. Arthritic changes diffusely  Psych: Affect appropriate.  Neurological: Mental Status: Alert. Speech fluent. No pseudobulbar affect Cranial Nerves: CNII: No RAPD. Visual fields intact. CNIII, IV, VI: PERRL. No nystagmus. EOMI. CN V: Facial sensation intact bilaterally to fine touch. CN VII: Facial muscles symmetric and strong. No ptosis at rest. CN VIII: Hears finger rub well bilaterally. CN IX: No hypophonia. CN X: Palate elevates symmetrically. CN XI: Full strength shoulder shrug bilaterally. CN XII: Tongue protrusion full and midline. No atrophy or fasciculations. No significant dysarthria Motor: Tone is normal. Tremor at rest and action (right hand more than left). Occasionally seen in neck/head. No spilling on pouring water.  Individual muscle group testing (MRC grade out of 5):  Movement     Neck flexion 5    Neck extension 5     Right Left   Shoulder abduction 5 5   Shoulder adduction 5 5   Shoulder ext rotation 5 5   Shoulder int rotation 5 5  Elbow flexion 5 5   Elbow extension 5 5   Finger abduction - FDI 5 5   Finger abduction - ADM 5 5   Finger extension 5 5   Finger distal flexion - 2/3 5 5    Finger distal flexion - 4/5 5 5    Thumb flexion - FPL 5 5   Thumb abduction  - APB 5 5    Hip flexion 5 5   Hip extension 5 5   Hip adduction 5 5   Hip abduction 5 5   Knee extension 5 5   Knee flexion 5 5   Dorsiflexion 5 5   Plantarflexion 5 5    Reflexes:  Right Left   Bicep 2+ 2+   Tricep 2+ 2+   BrRad 2+ 2+   Knee 3+ 3+ Cross adductors bilaterally  Ankle 2+ 2+    Pathological Reflexes: Hoffman: absent bilaterally Troemner: absent bilaterally Sensation: Pinprick: Intact in all extremities Coordination: Intact finger-to- nose-finger bilaterally. Romberg negative. RAM appear normal. Gait: Able to rise from chair with arms crossed unassisted. Normal, narrow-based gait. No freezing. Normal turns.  Labs and Imaging review: External labs: CBC w/ diff (01/08/24): unremarkable  Imaging/Procedures: MRI lumbar spine wo contrast (09/26/16): FINDINGS: Segmentation:  Standard.   Alignment: 11 mm spondylolisthesis at L5-S1 due to bilateral pars defects. Retrolisthesis of L1 on L2 and of L2 on L3.   Vertebrae:  No fracture, evidence of discitis, or bone lesion.   Conus medullaris: Extends to the L2 level and appears normal.   Paraspinal and other soft tissues: Negative.   Disc levels:   T12-L1:  Normal.   L1-2: Disc desiccation and disc space narrowing with a 3 mm retrolisthesis. Small broad-based soft disc protrusion of the uncovered disc without neural impingement.   L2-3: Disc desiccation and disc space narrowing with a small broad-based disc bulge with 2 mm retrolisthesis. No neural impingement.   L3-4: Disc space narrowing. Small broad-based disc bulge. Hypertrophy of the ligamentum flavum and facet joints create mild spinal stenosis with no focal neural impingement.   L4-5: 1.5 mm spondylolisthesis with a small broad-based protrusion of the uncovered disc. Hypertrophy of the ligamentum flavum and facet joints combine to create mild spinal stenosis with no focal neural impingement. Slight left foraminal stenosis. However, the L4 nerve  exits without impingement.   L5-S1: 11 mm spondylolisthesis secondary to bilateral pars defects. Minimal bulging of the central portion of the uncovered disc. Severe degenerative changes of the facet joints and at the pars defects with slight narrowing of the transverse dimension spinal canal without significant stenosis. Severe bilateral foraminal stenosis, left worse than right which could affect either or both L5 nerves.   IMPRESSION: 1. Spondylolisthesis of L5 on S1 with severe bilateral foraminal stenosis, left greater than right which could affect either or both L5 nerves. 2. Diffuse degenerative disc disease in the lumbar spine without focal neural impingement of both L5-S1.  MRI lumbar spine wo contrast (report provided by patient, external 11/04/19): 1. L5 chronic bilateral pars defects with L5-S1 grade 2 anterolisthesis and accelerated, severe disc degeneration. Related bilateral L5 compression and advanced spinal stenosis. 2. Multilevel disc degeneration. Up to moderate foraminal narrowing at L1-2, greater on the right. 3. T12 interval, healed compression fracture.  CT cervical spine wo contrast (07/05/20): FINDINGS: Alignment: Fused mild anterolisthesis at C4-5. Slight facet mediated anterolisthesis at C7-T1   Skull base and vertebrae: C4-C7 ACDF with ventral plate. Solid arthrodesis is seen across the C4-5 and  C5-6 disc spaces. No convincing solid arthrodesis at C6-7 where there is prominent cage subsidence into the C7 superior endplate. The graft material likely has bridging to the C7 superior endplate but there appears to be a cleft at the C6 inferior endplate. Screw loosening is present bilaterally at C7. As expected, there was motion at this level on a flexion radiograph from 01/14/2020.   No acute fracture.  No acute erosion.   Soft tissues and spinal canal: No visible inflammation or mass about the spine.   Disc levels:   C2-3: Facet osteoarthritis with  advanced left-sided spurring and subchondral cystic formation. Mild disc space narrowing. No visible impingement.   C3-4: Advanced facet osteoarthritis on the left. Left more than right uncovertebral ridging. Left foraminal impingement.   C4-5: ACDF.  No bony impingement   C5-6: ACDF.  No bony impingement   C6-7: ACDF.  No bony impingement.   C7-T1:Facet osteoarthritis with spurring on both sides. No herniation or impingement.   Upper chest: Negative   IMPRESSION: 1. C4-C7 ACDF with pseudoarthrosis findings at C6-7. 2. Advanced left-sided facet osteoarthritis at C2-3 and C3-4 with advanced left foraminal impingement at C3-4.  EMG (External 10/27/20): There is electrophysiologic evidence of mild chronic right L4, L5 and S1 radiculopathies. NO active denervation was seen. There is no evidence of right peroneal or tibial neuropathy or diffuse peripheral neuropathy.   EKG (03/31/24): NSR  Assessment/Plan:  Kristin Tapia is a 79 y.o. female who presents for evaluation of lightheadedness and pain in neck, low back radiating into legs, and many joints. She has a relevant medical history of cervical spine disease s/p surgery (~79 years of age), lumbar radiculopathy, OA, asthma, anxiety, detached retina, GERD, RLS, and hypothyroidism. Her neurological examination is pertinent for tremor in right hand > left and occasionally seen in the neck (rest and with action). Available diagnostic data is significant for cervical and lumbar radiculopathy by MRI and right L4-S1 radiculopathy by EMG in 2022.   Patient's lightheadedness appears very chronic with acute worsening after starting Cymbalta  in the summer of 2025. She has been evaluated by ENT and cardiology. There was no evidence of BPPV per patient on ENT examination, nor on mine today. She has improved greatly since PT.   In terms of her diffuse pain, worse in her low back and legs, this is likely a combination of known spine disease and  arthritis. It is a fair question that she asks regarding any inflammatory component. This would be something potentially treatable, so it is reasonable to screen her for this. In the end, this could be a matter of pain control. Many pain medications could cause sedation and worsening balance, so caution should be used. She may need further help from pain management.   PLAN: -Blood work: rheumatoid factor, ANA, ESR, CRP, B12 -Orthostatic intolerance information given -Continue home PT exercises -Patient will trial gabapentin  600 mg at bedtime -May consider pain management second opinion pending lab results  -Return to clinic in 6 months  The impression above as well as the plan as outlined below were extensively discussed with the patient who voiced understanding. All questions were answered to their satisfaction.  The patient was counseled on pertinent fall precautions per the printed material provided today, and as noted under the Patient Instructions section below.  When available, results of the above investigations and possible further recommendations will be communicated to the patient via telephone/MyChart. Patient to call office if not contacted after expected testing turnaround time.  Total time spent reviewing records, interview, history/exam, documentation, and coordination of care on day of encounter:  70 min   Thank you for allowing me to participate in patient's care.  If I can answer any additional questions, I would be pleased to do so.  Venetia Potters, MD   CC: Allen Lauraine CROME, PA-C [address] [place] [address]  CC: Referring provider: Ren Ny, Joelle DEL, MD [address] [place],  [place] [address]     [1]  Allergies Allergen Reactions   Sulfonamide Derivatives Anaphylaxis  [2]  Social History Tobacco Use   Smoking status: Never   Smokeless tobacco: Never  Vaping Use   Vaping status: Never Used  Substance Use Topics    Alcohol use: Never   Drug use: No   "

## 2024-07-30 ENCOUNTER — Ambulatory Visit: Admitting: Neurology

## 2024-07-30 ENCOUNTER — Encounter: Payer: Self-pay | Admitting: Neurology

## 2024-07-30 ENCOUNTER — Other Ambulatory Visit

## 2024-07-30 VITALS — BP 150/86 | HR 70 | Ht 63.5 in | Wt 132.0 lb

## 2024-07-30 DIAGNOSIS — M1990 Unspecified osteoarthritis, unspecified site: Secondary | ICD-10-CM | POA: Diagnosis not present

## 2024-07-30 DIAGNOSIS — R42 Dizziness and giddiness: Secondary | ICD-10-CM

## 2024-07-30 DIAGNOSIS — M5416 Radiculopathy, lumbar region: Secondary | ICD-10-CM | POA: Diagnosis not present

## 2024-07-30 DIAGNOSIS — M542 Cervicalgia: Secondary | ICD-10-CM | POA: Diagnosis not present

## 2024-07-30 DIAGNOSIS — M5412 Radiculopathy, cervical region: Secondary | ICD-10-CM

## 2024-07-30 NOTE — Patient Instructions (Signed)
 I saw you today for lightheadedness and increasing pain.  Fortunately, your lightheadedness seems to be improving. We will monitor this closely.  In terms of your pain, I would like to do blood work to determine if there is any evidence of an inflammatory condition contributing to your known neck, back, and arthritis causes of pain.  I will be in touch when I have the results of your blood work and decide next steps.  In the meantime, you will try gabapentin  600 mg at bedtime. You will let me know how this goes.  I will see you in clinic to re-examine you in about 6 months.  Please let me know if you have any questions or concerns in the meantime.  The physicians and staff at [facility] are committed to providing excellent care. You may receive a survey requesting feedback about your experience at our office. We strive to receive very good responses to the survey questions. If you feel that your experience would prevent you from giving the office a very good  response, please contact our office to try to remedy the situation. We may be reached at [phone]. Thank you for taking the time out of your busy day to complete the survey.  Venetia Potters, MD [facility]  Preventing Falls at [facility] are common, often dreaded events in the lives of older people. Aside from the obvious injuries and even death that may result, fall can cause wide-ranging consequences including loss of independence, mental decline, decreased activity and mobility. Younger people are also at risk of falling, especially those with chronic illnesses and fatigue.  Ways to reduce risk for falling Examine diet and medications. Warm foods and alcohol dilate blood vessels, which can lead to dizziness when standing. Sleep aids, antidepressants and pain medications can also increase the likelihood of a fall.  Get a vision exam. Poor vision, cataracts and glaucoma increase the chances of falling.  Check foot  gear. Shoes should fit snugly and have a sturdy, nonskid sole and a broad, low heel  Participate in a physician-approved exercise program to build and maintain muscle strength and improve balance and coordination. Programs that use ankle weights or stretch bands are excellent for muscle-strengthening. Water aerobics programs and low-impact Tai Chi programs have also been shown to improve balance and coordination.  Increase vitamin D  intake. Vitamin D  improves muscle strength and increases the amount of calcium the body is able to absorb and deposit in bones.  How to prevent falls from common hazards Floors - Remove all loose wires, cords, and throw rugs. Minimize clutter. Make sure rugs are anchored and smooth. Keep furniture in its usual place.  Chairs -- Use chairs with straight backs, armrests and firm seats. Add firm cushions to existing pieces to add height.  Bathroom - Install grab bars and non-skid tape in the tub or shower. Use a bathtub transfer bench or a shower chair with a back support Use an elevated toilet seat and/or safety rails to assist standing from a low surface. Do not use towel racks or bathroom tissue holders to help you stand.  Lighting - Make sure halls, stairways, and entrances are well-lit. Install a night light in your bathroom or hallway. Make sure there is a light switch at the top and bottom of the staircase. Turn lights on if you get up in the middle of the night. Make sure lamps or light switches are within reach of the bed if you have to get up during  the night.  Kitchen - Install non-skid rubber mats near the sink and stove. Clean spills immediately. Store frequently used utensils, pots, pans between waist and eye level. This helps prevent reaching and bending. Sit when getting things out of lower cupboards.  Living room/ Bedrooms - Place furniture with wide spaces in between, giving enough room to move around. Establish a route through the living room that gives  you something to hold onto as you walk.  Stairs - Make sure treads, rails, and rugs are secure. Install a rail on both sides of the stairs. If stairs are a threat, it might be helpful to arrange most of your activities on the lower level to reduce the number of times you must climb the stairs.  Entrances and doorways - Install metal handles on the walls adjacent to the doorknobs of all doors to make it more secure as you travel through the doorway.  Tips for maintaining balance Keep at least one hand free at all times. Try using a backpack or fanny pack to hold things rather than carrying them in your hands. Never carry objects in both hands when walking as this interferes with keeping your balance.  Attempt to swing both arms from front to back while walking. This might require a conscious effort if Parkinson's disease has diminished your movement. It will, however, help you to maintain balance and posture, and reduce fatigue.  Consciously lift your feet off of the ground when walking. Shuffling and dragging of the feet is a common culprit in losing your balance.  When trying to navigate turns, use a U technique of facing forward and making a wide turn, rather than pivoting sharply.  Try to stand with your feet shoulder-length apart. When your feet are close together for any length of time, you increase your risk of losing your balance and falling.  Do one thing at a time. Don't try to walk and accomplish another task, such as reading or looking around. The decrease in your automatic reflexes complicates motor function, so the less distraction, the better.  Do not wear rubber or gripping soled shoes, they might catch on the floor and cause tripping.  Move slowly when changing positions. Use deliberate, concentrated movements and, if needed, use a grab bar or walking aid. Count 15 seconds between each movement. For example, when rising from a seated position, wait 15 seconds after standing  to begin walking.  If balance is a continuous problem, you might want to consider a walking aid such as a cane, walking stick, or walker. Once you've mastered walking with help, you might be ready to try it on your own again.  Guidelines for the Nonpharmacological Treatment of Orthostatic Hypotension/ Orthostatic Tachycardia/ Orthostatic Intolerance  Make all postural changes from lying to sitting or sitting to standing, slowly.  Drink 2.0-2.5 L of fluid per day (if okay with your other doctors).  Increase sodium in the diet to 3-5 g per day (if okay with your other doctors).  Avoid large meals which can cause low blood pressure during digestion. It is better to eat smaller meals more often than 3 large meals.  Avoid alcohol. Alcohol can cause blood to pool in the legs which may worsen low blood pressure reactions when standing.  Perform lower extremity exercises to improve strength of the leg muscles. This will help prevent blood from pooling in the legs when standing and walking. Raise the head of the bed by 6-10 inches. The entire bed must be at  an angle. Raising only the head portion of the bed at the waist level or using pillows will not be effective. Raising the head of the bed will reduce urine formation overnight and there will be more volume in the circulation in the morning. It may also help orthostatic tolerance during the day.  During bad days or prior to engaging in more physical activity than usual, drink 500 ml of water quickly. This will result in increased blood pressure within 5 minutes of drinking the water. The effect will last up to a few hours and may improve orthostatic intolerance.  Use custom-fitted elastic support stockings. This will reduce a tendency for blood to pool in the legs when standing and may improve orthostatic intolerance. Abdominal binder or SPANX may also be useful.  Use physical counter-maneuvers such as leg crossing, squatting, or raising and  resting the leg on a chair. These maneuvers increase blood pressure and can improve orthostatic intolerance.  Gently escalated aerobic physical activity program for graded reconditioning (see details below)  Home-based Exercise Plan for Patients with Orthostatic Intolerance  Level 1 - Reclined Gentle Movements This is the starting point for those patients most severely disabled by an autonomic disorder. If you are bedridden, this is your first step. We have known dysautonomia patients who were bedridden for years, and we able to work their way from barely able to move to biking 45 minutes a day, everyday. It will be hard. It may make you feel worse in the beginning. But the human body was meant to move. Just take baby steps until you can make it to your goal. Some patients can only do one minute a day, or one minute at a time, a few times a day. And they do this every day for a week, and then they increase to two minutes a day the next week. This is a very slow process, but improving your health slowly is better than not improving it at all. Helpful exercises may include:  Leg Pillow Squeeze - while laying down or reclined in bed, put a pillow folded between your knees and squeeze. Hold it for 10 seconds. Repeat.  Arm Pillow Squeeze - put the pillow folded between your palms and squeeze together as though you were putting your hands into a praying position. Hold it for 10 seconds. Repeat.  Alphabet Toes - while laying in bed, write your name in the air with your toes. If you can build up your strength, write the whole alphabet. Do this several times a day.  Side Leg Lifts - while laying on your side, lift your leg up sideways and then bring your leg back down, without touching your legs together. Repeat.  Front Leg Lifts - While laying on your back, life your left leg up, pointing your toe towards the ceiling. Repeat. Switch to right leg.  Gentle Stretching - any kind of stretching helps move  blood around in the body and takes stress of your joints if you have been sitting or laying in the same position for a long time. Go through the entire body doing mild stretches, from feet, to legs, to back, to arms, to neck. Doing this when you wake up can be a great way to start the day, and repeating your stretches before bed can help you relax and sleep better.  Level 2 - Recumbent Cardio Exercises This is probably the level that most patients will be able to begin with, although everyone can benefit from the gentle  stretching and toning exercises described in Level 1.  Always begin your workout with 5-10 minutes of stretching and/or yoga to warm up your muscles and protect your joints from injury.  Since the point of these exercises is to get your cardiovascular system to be more efficient, you will want to set a target heart rate for your workout. You should speak to your doctor about this because medications and other medical conditions can impact your target heart rate, but most patients can tolerate a workout at 75% to 80% of their maximum heart rate. [facility] has a Target Heart Rate Calculator you can use as a guide when speaking with your doctor.  You may want to purchase an exercise heart rate monitor to wear during your workouts to help you keep your heart rate within your target zone. We do not endorse any specific products, but exercise heart rate monitors with chest straps are usually more accurate than pulse oximeters you place on your finger. This is especially so for dysautonomia patients who have abnormalities in peripheral blood flow (common in some forms of dysautonomia), as this is more likely to give an inaccurate reading using a finger based monitor.  Suggested reclined cardiovascular exercises include:  Rowing - use a rowing machine, or if you are feeling well enough, a kayak. You may want to start out slow, maybe 2-5 minutes a day. At your own pace, adding a few minutes  per week, try to work your way up to 45 minutes per day, 5 days a week, with 30 minutes of your routine done in your target heart rate zone. Be sure to warm up at the beginning and cool down at the end.  Recumbent Biking - recumbent exercise bikes are different than regular exercise bikes. They seat the rider in a reclined position, rather than upright. Try recumbent biking a few minutes a day, adding a few minutes each week, until you can work out 45 minutes a day, five days a week, with 30 minutes of that workout in your target heart rate zone. Be sure to warm up at the beginning and cool down at the end of each workout.  Swimming - The pressure from water helps prevent orthostatic symptoms. Dysautonomia patients who have been bedridden for years may be able to stand upright for an hour in a pool, because the pressure from the water prevents orthostatic symptoms from occurring, or lessens their impact. Dysautonomia patients can take advantage of this to get a good cardio workout, or to focus on stretching and strength training in the water. Always swim with a spotter or a buddy who can keep and eye on you, just in case you develop lightheadedness or other symptoms that would make it unsafe to be in a pool. It may be best to start your swimming exercise program at a pool with a lifeguard, or with a Physical Therapist who specializes in aquatic therapy. A good old fashioned kick board can be a great tool for dysautonomia patients. You can kick your way around the pool, which gives you a good cardio workout, and all that kicking helps strengthen your legs. Toning up your legs and core is a great way to minimize orthostatic symptoms.  Weight Training - Most dysautonomia patients can benefit from overall increase in tone and strength. The stronger our muscles are, the more efficiently they use oxygen, the better we will be able to tolerate orthostatic stress. Special emphasis can be placed on strengthening leg  and core  muscles. Some patients find it useful to wear 2 to 4 lb. weights that attach to the ankles with velcro. This can really help with leg strength if you wear them often enough. There are also machines at the gym that assist with core and leg weight training. Begin with light weights, and use them in a reclined or seated position. Some patients become extra symptomatic when lifting their arms over their head, so take extra care if attempting to do that.  Level 3 - Normal Workouts Some dysautonomia patients are able to jog, run marathons or walk several miles a week. These patients should do whatever they can to continue these activities. Dysautonomia patients who are well-conditioned should exercise 45 minutes a day, at least 3 days per week. Special emphasis should be placed on leg and core strength, and cardiovascular exercises.

## 2024-07-31 LAB — SEDIMENTATION RATE: Sed Rate: 11 mm/h (ref 0–30)

## 2024-07-31 LAB — VITAMIN B12: Vitamin B-12: 385 pg/mL (ref 200–1100)

## 2024-09-18 ENCOUNTER — Other Ambulatory Visit: Admitting: Obstetrics

## 2024-10-17 ENCOUNTER — Ambulatory Visit: Admitting: Obstetrics

## 2025-02-18 ENCOUNTER — Ambulatory Visit: Payer: Self-pay | Admitting: Neurology
